# Patient Record
Sex: Male | Born: 1975 | Race: White | Hispanic: No | Marital: Married | Smoking: Former smoker
Health system: Southern US, Community
[De-identification: ages and names within clinical notes are randomized; demographics above are authoritative.]

## PROBLEM LIST (undated history)

## (undated) DIAGNOSIS — H9193 Unspecified hearing loss, bilateral: Secondary | ICD-10-CM

## (undated) DIAGNOSIS — C8190 Hodgkin lymphoma, unspecified, unspecified site: Secondary | ICD-10-CM

## (undated) DIAGNOSIS — R221 Localized swelling, mass and lump, neck: Secondary | ICD-10-CM

## (undated) DIAGNOSIS — I2699 Other pulmonary embolism without acute cor pulmonale: Secondary | ICD-10-CM

## (undated) DIAGNOSIS — R0602 Shortness of breath: Secondary | ICD-10-CM

## (undated) DIAGNOSIS — J9601 Acute respiratory failure with hypoxia: Secondary | ICD-10-CM

## (undated) DIAGNOSIS — K219 Gastro-esophageal reflux disease without esophagitis: Secondary | ICD-10-CM

## (undated) DIAGNOSIS — I5189 Other ill-defined heart diseases: Secondary | ICD-10-CM

## (undated) HISTORY — PX: BONE MARROW ASPIRATION: SHX1252

## (undated) HISTORY — PX: EYE SURGERY: SHX253

## (undated) HISTORY — PX: BONE MARROW BIOPSY: SHX199

---

## 2010-07-31 ENCOUNTER — Ambulatory Visit (HOSPITAL_COMMUNITY)
Admission: RE | Admit: 2010-07-31 | Discharge: 2010-07-31 | Payer: Self-pay | Source: Home / Self Care | Admitting: Family Medicine

## 2010-08-09 ENCOUNTER — Ambulatory Visit (HOSPITAL_COMMUNITY)
Admission: RE | Admit: 2010-08-09 | Discharge: 2010-08-09 | Payer: Self-pay | Source: Home / Self Care | Attending: Family Medicine | Admitting: Family Medicine

## 2012-04-24 ENCOUNTER — Other Ambulatory Visit: Payer: Self-pay | Admitting: Family Medicine

## 2012-04-24 DIAGNOSIS — R221 Localized swelling, mass and lump, neck: Secondary | ICD-10-CM

## 2012-04-26 DIAGNOSIS — R221 Localized swelling, mass and lump, neck: Secondary | ICD-10-CM

## 2012-04-26 HISTORY — DX: Localized swelling, mass and lump, neck: R22.1

## 2012-04-28 ENCOUNTER — Ambulatory Visit (HOSPITAL_COMMUNITY)
Admission: RE | Admit: 2012-04-28 | Discharge: 2012-04-28 | Disposition: A | Discharge status: Home / Self Care | Payer: BC Managed Care – PPO | Source: Ambulatory Visit | Attending: Family Medicine | Admitting: Family Medicine

## 2012-04-28 ENCOUNTER — Other Ambulatory Visit: Payer: Self-pay | Admitting: Family Medicine

## 2012-04-28 DIAGNOSIS — R221 Localized swelling, mass and lump, neck: Secondary | ICD-10-CM

## 2012-04-28 DIAGNOSIS — R220 Localized swelling, mass and lump, head: Secondary | ICD-10-CM | POA: Insufficient documentation

## 2012-04-28 DIAGNOSIS — R599 Enlarged lymph nodes, unspecified: Secondary | ICD-10-CM | POA: Insufficient documentation

## 2012-04-28 MED ORDER — IOHEXOL 300 MG/ML  SOLN
75.0000 mL | Freq: Once | INTRAMUSCULAR | Status: AC | PRN
  Administered 2012-04-28: 75 mL via INTRAVENOUS

## 2012-04-30 ENCOUNTER — Ambulatory Visit (INDEPENDENT_AMBULATORY_CARE_PROVIDER_SITE_OTHER): Payer: BC Managed Care – PPO | Admitting: Otolaryngology

## 2012-04-30 DIAGNOSIS — R599 Enlarged lymph nodes, unspecified: Secondary | ICD-10-CM

## 2012-04-30 DIAGNOSIS — D487 Neoplasm of uncertain behavior of other specified sites: Secondary | ICD-10-CM

## 2012-05-01 ENCOUNTER — Encounter (HOSPITAL_BASED_OUTPATIENT_CLINIC_OR_DEPARTMENT_OTHER): Payer: Self-pay | Admitting: *Deleted

## 2012-05-05 ENCOUNTER — Encounter (HOSPITAL_BASED_OUTPATIENT_CLINIC_OR_DEPARTMENT_OTHER): Payer: Self-pay | Admitting: *Deleted

## 2012-05-05 ENCOUNTER — Ambulatory Visit (HOSPITAL_BASED_OUTPATIENT_CLINIC_OR_DEPARTMENT_OTHER): Payer: BC Managed Care – PPO | Admitting: *Deleted

## 2012-05-05 ENCOUNTER — Ambulatory Visit (HOSPITAL_BASED_OUTPATIENT_CLINIC_OR_DEPARTMENT_OTHER)
Admission: RE | Admit: 2012-05-05 | Discharge: 2012-05-05 | Disposition: A | Discharge status: Home / Self Care | Payer: BC Managed Care – PPO | Source: Ambulatory Visit | Attending: Otolaryngology | Admitting: Otolaryngology

## 2012-05-05 ENCOUNTER — Encounter (HOSPITAL_BASED_OUTPATIENT_CLINIC_OR_DEPARTMENT_OTHER)
Admission: RE | Disposition: A | Discharge status: Home / Self Care | Payer: Self-pay | Source: Ambulatory Visit | Attending: Otolaryngology

## 2012-05-05 DIAGNOSIS — R221 Localized swelling, mass and lump, neck: Secondary | ICD-10-CM

## 2012-05-05 DIAGNOSIS — C8198 Hodgkin lymphoma, unspecified, lymph nodes of multiple sites: Secondary | ICD-10-CM | POA: Insufficient documentation

## 2012-05-05 HISTORY — DX: Localized swelling, mass and lump, neck: R22.1

## 2012-05-05 HISTORY — PX: MASS BIOPSY: SHX5445

## 2012-05-05 LAB — POCT HEMOGLOBIN-HEMACUE: Hemoglobin: 12 g/dL — ABNORMAL LOW (ref 13.0–17.0)

## 2012-05-05 SURGERY — BIOPSY, MASS, NECK
Anesthesia: General | Site: Neck | Laterality: Left | Wound class: Clean

## 2012-05-05 MED ORDER — MIDAZOLAM HCL 5 MG/5ML IJ SOLN
INTRAMUSCULAR | Status: DC | PRN
  Administered 2012-05-05: 2 mg via INTRAVENOUS

## 2012-05-05 MED ORDER — FENTANYL CITRATE 0.05 MG/ML IJ SOLN: 25.0000 ug | INTRAMUSCULAR | Status: DC | PRN

## 2012-05-05 MED ORDER — OXYCODONE HCL 5 MG PO TABS: 5.0000 mg | ORAL_TABLET | Freq: Once | ORAL | Status: DC | PRN

## 2012-05-05 MED ORDER — AMOXICILLIN 875 MG PO TABS: 875.0000 mg | ORAL_TABLET | Freq: Two times a day (BID) | ORAL | Status: AC

## 2012-05-05 MED ORDER — ACETAMINOPHEN 10 MG/ML IV SOLN
1000.0000 mg | Freq: Once | INTRAVENOUS | Status: AC
  Administered 2012-05-05: 1000 mg via INTRAVENOUS

## 2012-05-05 MED ORDER — METOCLOPRAMIDE HCL 5 MG/ML IJ SOLN: 10.0000 mg | Freq: Once | INTRAMUSCULAR | Status: DC | PRN

## 2012-05-05 MED ORDER — SUCCINYLCHOLINE CHLORIDE 20 MG/ML IJ SOLN
INTRAMUSCULAR | Status: DC | PRN
  Administered 2012-05-05: 100 mg via INTRAVENOUS

## 2012-05-05 MED ORDER — SCOPOLAMINE 1 MG/3DAYS TD PT72
1.0000 | MEDICATED_PATCH | Freq: Once | TRANSDERMAL | Status: DC
  Administered 2012-05-05: 1.5 mg via TRANSDERMAL

## 2012-05-05 MED ORDER — PROPOFOL 10 MG/ML IV BOLUS
INTRAVENOUS | Status: DC | PRN
  Administered 2012-05-05: 250 mg via INTRAVENOUS

## 2012-05-05 MED ORDER — LIDOCAINE-EPINEPHRINE 1 %-1:100000 IJ SOLN
INTRAMUSCULAR | Status: DC | PRN
  Administered 2012-05-05: 3 mL

## 2012-05-05 MED ORDER — CEFAZOLIN SODIUM-DEXTROSE 2-3 GM-% IV SOLR
2.0000 g | Freq: Once | INTRAVENOUS | Status: AC
  Administered 2012-05-05: 2 g via INTRAVENOUS

## 2012-05-05 MED ORDER — ONDANSETRON HCL 4 MG/2ML IJ SOLN
INTRAMUSCULAR | Status: DC | PRN
  Administered 2012-05-05: 4 mg via INTRAVENOUS

## 2012-05-05 MED ORDER — DEXAMETHASONE SODIUM PHOSPHATE 4 MG/ML IJ SOLN
INTRAMUSCULAR | Status: DC | PRN
  Administered 2012-05-05: 10 mg via INTRAVENOUS

## 2012-05-05 MED ORDER — HEMOSTATIC AGENTS (NO CHARGE) OPTIME
TOPICAL | Status: DC | PRN
  Administered 2012-05-05: 1 via TOPICAL

## 2012-05-05 MED ORDER — HYDROCODONE-ACETAMINOPHEN 5-500 MG PO TABS: 1.0000 | ORAL_TABLET | ORAL | Status: AC | PRN

## 2012-05-05 MED ORDER — FENTANYL CITRATE 0.05 MG/ML IJ SOLN
INTRAMUSCULAR | Status: DC | PRN
  Administered 2012-05-05: 75 ug via INTRAVENOUS

## 2012-05-05 MED ORDER — OXYCODONE HCL 5 MG/5ML PO SOLN: 5.0000 mg | Freq: Once | ORAL | Status: DC | PRN

## 2012-05-05 MED ORDER — 0.9 % SODIUM CHLORIDE (POUR BTL) OPTIME
TOPICAL | Status: DC | PRN
  Administered 2012-05-05: 200 mL

## 2012-05-05 MED ORDER — LACTATED RINGERS IV SOLN
INTRAVENOUS | Status: DC
  Administered 2012-05-05: 08:00:00 via INTRAVENOUS

## 2012-05-05 MED ORDER — LIDOCAINE HCL (CARDIAC) 20 MG/ML IV SOLN
INTRAVENOUS | Status: DC | PRN
  Administered 2012-05-05: 60 mg via INTRAVENOUS

## 2012-05-05 SURGICAL SUPPLY — 70 items
BENZOIN TINCTURE PRP APPL 2/3 (GAUZE/BANDAGES/DRESSINGS) IMPLANT
BLADE SURG 15 STRL LF DISP TIS (BLADE) ×1 IMPLANT
BLADE SURG 15 STRL SS (BLADE) ×1
CANISTER SUCTION 1200CC (MISCELLANEOUS) ×2 IMPLANT
CLEANER CAUTERY TIP 5X5 PAD (MISCELLANEOUS) IMPLANT
CLIP TI MEDIUM 6 (CLIP) IMPLANT
CLIP TI WIDE RED SMALL 6 (CLIP) IMPLANT
CLOTH BEACON ORANGE TIMEOUT ST (SAFETY) ×2 IMPLANT
CORDS BIPOLAR (ELECTRODE) ×2 IMPLANT
COVER MAYO STAND STRL (DRAPES) ×2 IMPLANT
COVER TABLE BACK 60X90 (DRAPES) ×2 IMPLANT
DECANTER SPIKE VIAL GLASS SM (MISCELLANEOUS) ×2 IMPLANT
DERMABOND ADVANCED (GAUZE/BANDAGES/DRESSINGS) ×1
DERMABOND ADVANCED .7 DNX12 (GAUZE/BANDAGES/DRESSINGS) ×1 IMPLANT
DRAIN JACKSON RD 7FR 3/32 (WOUND CARE) IMPLANT
DRAIN PENROSE 1/4X12 LTX STRL (WOUND CARE) IMPLANT
DRAIN TLS ROUND 10FR (DRAIN) IMPLANT
DRAPE U-SHAPE 76X120 STRL (DRAPES) ×2 IMPLANT
ELECT COATED BLADE 2.86 ST (ELECTRODE) ×2 IMPLANT
ELECT NEEDLE BLADE 2-5/6 (NEEDLE) IMPLANT
ELECT PAIRED SUBDERMAL (MISCELLANEOUS)
ELECT REM PT RETURN 9FT ADLT (ELECTROSURGICAL) ×2
ELECTRODE PAIRED SUBDERMAL (MISCELLANEOUS) IMPLANT
ELECTRODE REM PT RTRN 9FT ADLT (ELECTROSURGICAL) ×1 IMPLANT
EVACUATOR SILICONE 100CC (DRAIN) IMPLANT
GAUZE SPONGE 4X4 12PLY STRL LF (GAUZE/BANDAGES/DRESSINGS) IMPLANT
GAUZE SPONGE 4X4 16PLY XRAY LF (GAUZE/BANDAGES/DRESSINGS) IMPLANT
GLOVE BIO SURGEON STRL SZ7.5 (GLOVE) ×2 IMPLANT
GLOVE ECLIPSE 6.5 STRL STRAW (GLOVE) ×2 IMPLANT
GOWN PREVENTION PLUS XLARGE (GOWN DISPOSABLE) ×4 IMPLANT
HEMOSTAT SURGICEL .5X2 ABSORB (HEMOSTASIS) ×2 IMPLANT
LOCATOR NERVE 3 VOLT (DISPOSABLE) ×2 IMPLANT
NEEDLE 27GAX1X1/2 (NEEDLE) ×2 IMPLANT
NEEDLE HYPO 25X1 1.5 SAFETY (NEEDLE) ×2 IMPLANT
NS IRRIG 1000ML POUR BTL (IV SOLUTION) ×2 IMPLANT
PACK BASIN DAY SURGERY FS (CUSTOM PROCEDURE TRAY) ×2 IMPLANT
PAD CLEANER CAUTERY TIP 5X5 (MISCELLANEOUS)
PENCIL BUTTON HOLSTER BLD 10FT (ELECTRODE) ×2 IMPLANT
PIN SAFETY STERILE (MISCELLANEOUS) IMPLANT
PROBE NERVBE PRASS .33 (MISCELLANEOUS) IMPLANT
SLEEVE SCD COMPRESS KNEE MED (MISCELLANEOUS) ×2 IMPLANT
SPONGE GAUZE 2X2 8PLY STRL LF (GAUZE/BANDAGES/DRESSINGS) IMPLANT
STAPLER VISISTAT 35W (STAPLE) IMPLANT
STRIP CLOSURE SKIN 1/4X4 (GAUZE/BANDAGES/DRESSINGS) IMPLANT
SUCTION FRAZIER TIP 10 FR DISP (SUCTIONS) IMPLANT
SUT CHROMIC 3 0 PS 2 (SUTURE) IMPLANT
SUT CHROMIC 4 0 P 3 18 (SUTURE) IMPLANT
SUT ETHILON 3 0 PS 1 (SUTURE) IMPLANT
SUT ETHILON 4 0 PS 2 18 (SUTURE) IMPLANT
SUT ETHILON 5 0 P 3 18 (SUTURE)
SUT NYLON ETHILON 5-0 P-3 1X18 (SUTURE) IMPLANT
SUT PLAIN 5 0 P 3 18 (SUTURE) IMPLANT
SUT PROLENE 4 0 P 3 18 (SUTURE) IMPLANT
SUT PROLENE 5 0 P 3 (SUTURE) IMPLANT
SUT SILK 3 0 TIES 17X18 (SUTURE)
SUT SILK 3-0 18XBRD TIE BLK (SUTURE) IMPLANT
SUT SILK 4 0 TIES 17X18 (SUTURE) ×2 IMPLANT
SUT SURG 6 0 PRE1/P 10 (SUTURE) IMPLANT
SUT VIC AB 3-0 FS2 27 (SUTURE) IMPLANT
SUT VIC AB 4-0 P-3 18XBRD (SUTURE) ×1 IMPLANT
SUT VIC AB 4-0 P3 18 (SUTURE) ×1
SUT VIC AB 4-0 RB1 27 (SUTURE)
SUT VIC AB 4-0 RB1 27X BRD (SUTURE) IMPLANT
SUT VICRYL 4-0 PS2 18IN ABS (SUTURE) IMPLANT
SYR BULB 3OZ (MISCELLANEOUS) ×2 IMPLANT
SYR CONTROL 10ML LL (SYRINGE) ×2 IMPLANT
TOWEL OR 17X24 6PK STRL BLUE (TOWEL DISPOSABLE) ×2 IMPLANT
TRAY DSU PREP LF (CUSTOM PROCEDURE TRAY) ×2 IMPLANT
TUBE CONNECTING 20X1/4 (TUBING) ×2 IMPLANT
WATER STERILE IRR 1000ML POUR (IV SOLUTION) IMPLANT

## 2012-05-05 NOTE — Brief Op Note (Signed)
05/05/2012  9:49 AM  PATIENT:  Samuel [name]  36 y.o. male  PRE-OPERATIVE DIAGNOSIS:  left neck mass  POST-OPERATIVE DIAGNOSIS:  left neck mass  PROCEDURE:  Procedure(s) (LRB) with comments: NECK MASS BIOPSY (Left) - excisional biopsy of left neck mass   SURGEON:  Surgeon(s) and Role:    * Sui W Onur Mori, MD - Primary  PHYSICIAN ASSISTANT:   ASSISTANTS: none   ANESTHESIA:   general  EBL:  Total I/O In: 1000 [I.V.:1000] Out: -   BLOOD ADMINISTERED:none  DRAINS: none   LOCAL MEDICATIONS USED:  LIDOCAINE  and Amount: 3 ml  SPECIMEN:  Source of Specimen:  Excisional biopsy of 2 left neck lymph nodes  DISPOSITION OF SPECIMEN:  PATHOLOGY  COUNTS:  YES  TOURNIQUET:  * No tourniquets in log *  DICTATION: Reubin Milan Dictation and Other Dictation: Dictation Number [id number]  PLAN OF CARE: Discharge to home after PACU  PATIENT DISPOSITION:  PACU - hemodynamically stable.   Delay start of Pharmacological VTE agent (>24hrs) due to surgical blood loss or risk of bleeding: not applicable

## 2012-05-05 NOTE — H&P (Signed)
  H&P Update  Pt's original H&P dated 04/30/12 reviewed and placed in chart (to be scanned).  I personally examined the patient today.  No change in health. Proceed with excisional biopsy of left neck mass.

## 2012-05-05 NOTE — Transfer of Care (Signed)
Immediate Anesthesia Transfer of Care Note  Patient: Samuel [name]  Procedure(s) Performed: Procedure(s) (LRB) with comments: NECK MASS BIOPSY (Left) - Excisional biopsy of left neck mass   Patient Location: PACU  Anesthesia Type: General  Level of Consciousness: awake and alert   Airway & Oxygen Therapy: Patient Spontanous Breathing and Patient connected to face mask oxygen  Post-op Assessment: Report given to PACU RN, Post -op Vital signs reviewed and stable and Patient moving all extremities  Post vital signs: Reviewed and stable  Complications: No apparent anesthesia complications

## 2012-05-05 NOTE — Anesthesia Postprocedure Evaluation (Signed)
Anesthesia Post Note  Patient: Samuel [name]  Procedure(s) Performed: Procedure(s) (LRB): NECK MASS BIOPSY (Left)  Anesthesia type: General  Patient location: PACU  Post pain: Pain level controlled  Post assessment: Patient's Cardiovascular Status Stable  Last Vitals:  Filed Vitals:   05/05/12 1048  BP: 118/58  Pulse: 56  Temp: 36.6 C  Resp: 16    Post vital signs: Reviewed and stable  Level of consciousness: alert  Complications: No apparent anesthesia complications

## 2012-05-05 NOTE — Anesthesia Preprocedure Evaluation (Addendum)
Anesthesia Evaluation  Patient identified by MRN, date of birth, ID band Patient awake    Reviewed: Allergy & Precautions, H&P , NPO status , Patient's Chart, lab work & pertinent test results, reviewed documented beta blocker date and time   Airway Mallampati: II TM Distance: >3 FB Neck ROM: full    Dental   Pulmonary neg pulmonary ROS,  breath sounds clear to auscultation        Cardiovascular negative cardio ROS  Rhythm:regular     Neuro/Psych negative neurological ROS  negative psych ROS   GI/Hepatic negative GI ROS, Neg liver ROS,   Endo/Other  negative endocrine ROS  Renal/GU negative Renal ROS  negative genitourinary   Musculoskeletal   Abdominal   Peds  Hematology negative hematology ROS (+)   Anesthesia Other Findings See surgeon's H&P   Reproductive/Obstetrics negative OB ROS                           Anesthesia Physical Anesthesia Plan  ASA: I  Anesthesia Plan: General   Post-op Pain Management:    Induction: Intravenous  Airway Management Planned: Oral ETT  Additional Equipment:   Intra-op Plan:   Post-operative Plan: Extubation in OR  Informed Consent: I have reviewed the patients History and Physical, chart, labs and discussed the procedure including the risks, benefits and alternatives for the proposed anesthesia with the patient or authorized representative who has indicated his/her understanding and acceptance.   Dental Advisory Given  Plan Discussed with: CRNA and Surgeon  Anesthesia Plan Comments:         Anesthesia Quick Evaluation  

## 2012-05-05 NOTE — Anesthesia Procedure Notes (Signed)
Procedure Name: Intubation Date/Time: 05/05/2012 9:04 AM Performed by: Meyer Russel Pre-anesthesia Checklist: Patient identified, Emergency Drugs available, Suction available and Patient being monitored Patient Re-evaluated:Patient Re-evaluated prior to inductionOxygen Delivery Method: Circle System Utilized Preoxygenation: Pre-oxygenation with 100% oxygen Intubation Type: IV induction Ventilation: Mask ventilation without difficulty Laryngoscope Size: Miller and 2 Grade View: Grade I Tube type: Oral Number of attempts: 1 Airway Equipment and Method: stylet and LTA kit utilized Placement Confirmation: ETT inserted through vocal cords under direct vision,  positive ETCO2 and breath sounds checked- equal and bilateral Secured at: 22 cm Tube secured with: Tape Dental Injury: Teeth and Oropharynx as per pre-operative assessment

## 2012-05-06 ENCOUNTER — Encounter (HOSPITAL_BASED_OUTPATIENT_CLINIC_OR_DEPARTMENT_OTHER): Payer: Self-pay | Admitting: Otolaryngology

## 2012-05-06 NOTE — Op Note (Signed)
NAME:  Samuel [name], MALER                 ACCOUNT NO.:  [id number]  MEDICAL RECORD NO.:  [id number]  LOCATION:                                 FACILITY:  PHYSICIAN:  Newman Pies, MD            DATE OF BIRTH:  [birth year]  DATE OF PROCEDURE:  05/05/2012 DATE OF DISCHARGE:                              OPERATIVE REPORT   SURGEON:  Newman Pies, MD  PREOPERATIVE DIAGNOSIS:  Bilateral cervical and mediastinal lymphadenopathy.  POSTOPERATIVE DIAGNOSIS:  Bilateral cervical and mediastinal lymphadenopathy.  PROCEDURE PERFORMED:  Excisional biopsy of left neck mass.  ANESTHESIA:  General endotracheal tube anesthesia.  COMPLICATIONS:  None.  ESTIMATED BLOOD LOSS:  Minimal.  INDICATION FOR PROCEDURE:  The patient is a 36 year old male who recently noted a firm left inferior neck mass.  He was seen by his primary care physician, who ordered a neck CT scan.  The CT shows bilateral extensive lymphadenopathy, as well as large masses within the mediastinum.  The results were concerning for lymphoma.  On examination, the patient was noted to have multiple enlarged lymph nodes within the inferior neck bilaterally.  The most prominent and probable one was located at the left supraclavicular area, posterior to the sternocleidomastoid muscles.  Based on the above findings, the decision was made for the patient to undergo excisional biopsy of his deep left neck mass.  The risks, benefits, alternatives, and details of the procedure were discussed with the patient.  Questions were invited and answered.  Informed consent was obtained.  DESCRIPTION:  The patient was taken to the operating room and placed supine on the operating table.  General endotracheal tube anesthesia was administered by the anesthesiologist.  Preop IV antibiotic was given. The patient was positioned and prepped and draped in a standard fashion for left neck surgery.  Lidocaine 1% with 1:100,000 epinephrine was injected at the planned site  of incisions.  Horizontal incision was made over the left supraclavicular area.  The incision was carried down past the level of the platysma muscles.  Superiorly-based subplatysmal flap was elevated in a standard fashion.  The border of the sternocleidomastoid muscles was identified and retracted anteriorly. The spinal accessory nerve was identified and preserved.  At this point, the patient was noted to have multiple matted lymph nodes within the supraclavicular area.  Two enlarged lymph nodes were carefully dissected freed from the surrounding soft tissue.  The specimens were sent to the Pathology Department for histologic identification and flow cytometry analysis.  The spinal accessory nerve was preserved throughout the procedure.  The surgical site was copiously irrigated.  A small piece of Surgicel was placed for hemostasis.  The incision was closed in layers with 4-0 Vicryl and Dermabond.  The care of the patient was turned over to the anesthesiologist.  The patient was awakened from anesthesia without difficulty.  He was extubated and transferred to the recovery room in good condition.  OPERATIVE FINDINGS:  Multiple enlarged lymph nodes were noted within the left supraclavicular area.  Two enlarged lymph nodes were removed and sent to the Pathology Department for analysis.  SPECIMEN:  Left supraclavicular lymph nodes.  FOLLOWUP  CARE:  The patient will be discharged home once he is awake and alert.  He will be placed on Vicodin p.r.n. pain, and amoxicillin 875 mg p.o. b.i.d. for 5 days.  The patient will follow up in my office in 1 week.     Newman Pies, MD     ST/MEDQ  D:  05/05/2012  T:  05/06/2012  Job:  [id number]  cc:   Lorin Picket A. Gerda Diss, MD

## 2012-05-15 ENCOUNTER — Encounter (HOSPITAL_COMMUNITY): Payer: Self-pay | Admitting: Oncology

## 2012-05-15 ENCOUNTER — Encounter (HOSPITAL_COMMUNITY): Payer: BC Managed Care – PPO | Attending: Oncology | Admitting: Oncology

## 2012-05-15 VITALS — BP 101/59 | HR 93 | Temp 98.3°F | Resp 16 | Ht 68.5 in | Wt 189.4 lb

## 2012-05-15 DIAGNOSIS — R21 Rash and other nonspecific skin eruption: Secondary | ICD-10-CM

## 2012-05-15 DIAGNOSIS — C8190 Hodgkin lymphoma, unspecified, unspecified site: Secondary | ICD-10-CM | POA: Insufficient documentation

## 2012-05-15 DIAGNOSIS — C8119 Nodular sclerosis classical Hodgkin lymphoma, extranodal and solid organ sites: Secondary | ICD-10-CM

## 2012-05-15 LAB — CBC WITH DIFFERENTIAL/PLATELET
Hemoglobin: 12.1 g/dL — ABNORMAL LOW (ref 13.0–17.0)
Lymphocytes Relative: 9 % — ABNORMAL LOW (ref 12–46)
Lymphs Abs: 1.2 10*3/uL (ref 0.7–4.0)
Monocytes Relative: 7 % (ref 3–12)
Neutro Abs: 10.8 10*3/uL — ABNORMAL HIGH (ref 1.7–7.7)
Neutrophils Relative %: 83 % — ABNORMAL HIGH (ref 43–77)
Platelets: 260 10*3/uL (ref 150–400)
RBC: 4.51 MIL/uL (ref 4.22–5.81)
WBC: 13 10*3/uL — ABNORMAL HIGH (ref 4.0–10.5)

## 2012-05-15 LAB — SEDIMENTATION RATE: Sed Rate: 60 mm/hr — ABNORMAL HIGH (ref 0–16)

## 2012-05-15 LAB — COMPREHENSIVE METABOLIC PANEL
ALT: 29 U/L (ref 0–53)
Albumin: 3.4 g/dL — ABNORMAL LOW (ref 3.5–5.2)
Alkaline Phosphatase: 111 U/L (ref 39–117)
BUN: 13 mg/dL (ref 6–23)
Chloride: 97 mEq/L (ref 96–112)
Potassium: 4 mEq/L (ref 3.5–5.1)
Sodium: 133 mEq/L — ABNORMAL LOW (ref 135–145)
Total Bilirubin: 0.5 mg/dL (ref 0.3–1.2)
Total Protein: 7.8 g/dL (ref 6.0–8.3)

## 2012-05-15 LAB — LACTATE DEHYDROGENASE: LDH: 213 U/L (ref 94–250)

## 2012-05-15 NOTE — Progress Notes (Signed)
Samuel [name] presented for labwork. Labs per MD order drawn via Peripheral Line 23 gauge needle inserted in right AC Good blood return present. Procedure without incident.  Needle removed intact. Patient tolerated procedure well.

## 2012-05-15 NOTE — Progress Notes (Signed)
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 Problem #2 history smoking a half-pack of cigarettes a day for approximately 10 years so he has quit recently. This is a very pleasant 36 year old gentleman who for 5 weeks ago noticed a lump in his left neck that was painless but did not go away. He states he's had lumps under his arm hips which have come and gone over the years but never like this. He is not having fevers, night sweats, nor has he lost 10% of his body weight within the last 6 months. He thinks his weight is down approximately 10 pounds in the last 6 months maximum. He is accompanied by his wife. They have been married a number of years. They have 2 children both daughters alive in good health. He has one brother and 2 sisters in good health to the best of his knowledge and his parents are in good health. He works at the [employer]. He is working for 2 years. Prior to that he worked at the [employer]. He is not aware of lymphadenopathy under his arms or in his groin area. Is not having bone pain back pain abdominal pain. Bowels are fine. He does not complain of shortness of breath or cough. He does not have headaches. He does not have nausea or vomiting. He does not use alcohol or drugs. Vital signs are recorded. He is afebrile. Pulse is 80 and regular in the supine position. Respirations are 16 and unlabored. He has clear cut lymphadenopathy in both supraclavicular fossa and in the left low anterior chain. The largest lymph node in the right supraclavicular fossa is 3 x 3 cm the largest on the left is approximately the same. He has no axillary lymphadenopathy. He has no epitrochlear adenopathy. He has no inguinal lymphadenopathy. He is no hepatosplenomegaly. Bowel sounds are normal. His lungs are clear to auscultation and percussion. His heart shows a regular rhythm and rate without murmur rub or gallop. He has no leg edema  no arm edema. Teeth are in good repair is clear tongue is normal in the midline. His skin however shows a rash which is macular papular and throughout the trunk upper arms back abdomen consistent with a drug rash. He just finished amoxicillin on Monday. This is somewhat pruritic but only mildly so.  This gentleman needs a complete evaluation and staging for his Hodgkin's disease nodular sclerosing type. He will need a PET scan, bone marrow aspirate and biopsy, 2-D echo, bone function studies, and radiation therapy consultation at some point in the future low blood work which we will do today. He will need chemotherapy and therefore will need a Port-A-Cath placed

## 2012-05-15 NOTE — Patient Instructions (Addendum)
[facility]  Discharge Instructions  RECOMMENDATIONS MADE BY THE CONSULTANT AND ANY TEST RESULTS WILL BE SENT TO YOUR REFERRING DOCTOR.   EXAM FINDINGS BY MD TODAY AND SIGNS AND SYMPTOMS TO REPORT TO CLINIC OR PRIMARY MD: Exam and discussion by MD.  Bonita Quin will need to have a bone marrow biopsy and aspiration on Monday morning, a PET scan, a Echocardiogram, pulmonary function tests, port a cath placement and a radiation therapy consult.  As these are scheduled we will let you know.  You will need chemotherapy and we will get you scheduled for teaching.  You have Hodgkin's Disease - Nodular Sclerosing Type. MEDICATIONS PRESCRIBED: xanax 1 mg   - take 1 tablet 1 hour prior to your bone marrow biopsy on Monday morning.  Also, take 1 pain pill at the same time.   INSTRUCTIONS GIVEN AND DISCUSSED: Other :  For PET scan do not eat or drink anything after midnight - especially nothing with sugar (gum, mints, hard candy, etc.)  SPECIAL INSTRUCTIONS/FOLLOW-UP: Xray Studies Needed as scheduled and Return to Clinic on Monday at 8:10am.   I acknowledge that I have been informed and understand all the instructions given to me and received a copy. I do not have any more questions at this time, but understand that I may call the Specialty Clinic at [facility] at [phone] during business hours should I have any further questions or need assistance in obtaining follow-up care.    __________________________________________  _____________  __________ Signature of Patient or Authorized Representative            Date                   Time    __________________________________________ Nurse's Signature

## 2012-05-18 ENCOUNTER — Encounter (HOSPITAL_COMMUNITY): Payer: BC Managed Care – PPO

## 2012-05-18 ENCOUNTER — Encounter (HOSPITAL_BASED_OUTPATIENT_CLINIC_OR_DEPARTMENT_OTHER): Payer: BC Managed Care – PPO | Admitting: Oncology

## 2012-05-18 VITALS — BP 121/65 | HR 82 | Temp 98.3°F | Resp 16

## 2012-05-18 DIAGNOSIS — C8190 Hodgkin lymphoma, unspecified, unspecified site: Secondary | ICD-10-CM

## 2012-05-18 DIAGNOSIS — C8119 Nodular sclerosis classical Hodgkin lymphoma, extranodal and solid organ sites: Secondary | ICD-10-CM

## 2012-05-18 LAB — IGG, IGA, IGM: IgM, Serum: 86 mg/dL (ref 41–251)

## 2012-05-18 LAB — BETA 2 MICROGLOBULIN, SERUM: Beta-2 Microglobulin: 1.69 mg/L (ref 1.01–1.73)

## 2012-05-18 LAB — CBC WITH DIFFERENTIAL/PLATELET
Basophils Relative: 0 % (ref 0–1)
HCT: 38 % — ABNORMAL LOW (ref 39.0–52.0)
Hemoglobin: 12.5 g/dL — ABNORMAL LOW (ref 13.0–17.0)
Lymphocytes Relative: 9 % — ABNORMAL LOW (ref 12–46)
Lymphs Abs: 1.3 10*3/uL (ref 0.7–4.0)
Monocytes Absolute: 1 10*3/uL (ref 0.1–1.0)
Monocytes Relative: 7 % (ref 3–12)
Neutro Abs: 11.3 10*3/uL — ABNORMAL HIGH (ref 1.7–7.7)
Neutrophils Relative %: 82 % — ABNORMAL HIGH (ref 43–77)
RBC: 4.63 MIL/uL (ref 4.22–5.81)
WBC: 13.8 10*3/uL — ABNORMAL HIGH (ref 4.0–10.5)

## 2012-05-18 NOTE — Progress Notes (Signed)
Procedure note: After informed consent Samuel [name] was placed in the prone position and the posterior superior iliac spinous processes were identified. The left was chosen. He had hair removed over the area and then he was cleansed with 3 Betadine swabs. He was then anesthetized with 9 cc of 2% plain Xylocaine. A bone marrow aspirate and biopsy was obtained without difficulty. It was sent for routine pathological evaluation as well as flow cytometry. This was done for staging of his Hodgkin's disease. No complications occurred.

## 2012-05-18 NOTE — Patient Instructions (Addendum)
[facility]  Discharge Instructions  RECOMMENDATIONS MADE BY THE CONSULTANT AND ANY TEST RESULTS WILL BE SENT TO YOUR REFERRING DOCTOR.   EXAM FINDINGS BY MD TODAY AND SIGNS AND SYMPTOMS TO REPORT TO CLINIC OR PRIMARY MD: You had a bone marrow biopsy and aspiration performed today.  Keep the dressing clean and dry for the next 24 - 36 hours.  Take your pain medication as needed.    INSTRUCTIONS GIVEN AND DISCUSSED: Other :  If bleeding at the site occurs apply direct pressure for 10 minutes.  If bleeding does not stop go to the ED.  SPECIAL INSTRUCTIONS/FOLLOW-UP: Other (Referral/Appointments) :  As scheduled.   I acknowledge that I have been informed and understand all the instructions given to me and received a copy. I do not have any more questions at this time, but understand that I may call the Specialty Clinic at [facility] at [phone] during business hours should I have any further questions or need assistance in obtaining follow-up care.    __________________________________________  _____________  __________ Signature of Patient or Authorized Representative            Date                   Time    __________________________________________ Nurse's Signature

## 2012-05-18 NOTE — Progress Notes (Signed)
Samuel [name] presented for labwork. Labs per MD order drawn via Peripheral Line 23 gauge needle inserted in right AC  Good blood return present. Procedure without incident.  Needle removed intact. Patient tolerated procedure well. [facility] BONE MARROW BIOPSY/ASPIRATE PROGRESS NOTE  Samuel [name] presents for Bone Marrow biopsy per MD orders. Drue Stager Pall verbalized understanding of procedure. Xanax 1 mg po and Hydrocodone 5/500 po taken by patient at 7:30am at home prior to arrival. Consent reviewed and signed.  Drue Stager Mcweeney positioned supine for procedure. Time-out performed and Bone Marrow Checklist. Procedure began at 0855. Xylocaine 2% 10 cc used for local and administered to patient by Dr. Mariel Sleet. Procedure completed at 0910. Patient tolerated well. Pressure dressing applied to the left hip with instructions to leave in place for 24 hours. Patient instructed to report any bleeding that saturates dressing and to take pain medication Hydrocodone as directed. Dressing dry and intact to the left hip on discharge.

## 2012-05-19 ENCOUNTER — Other Ambulatory Visit (HOSPITAL_COMMUNITY): Payer: BC Managed Care – PPO

## 2012-05-19 ENCOUNTER — Telehealth (HOSPITAL_COMMUNITY): Payer: Self-pay | Admitting: *Deleted

## 2012-05-19 ENCOUNTER — Telehealth (HOSPITAL_COMMUNITY): Payer: Self-pay | Admitting: Oncology

## 2012-05-19 NOTE — Telephone Encounter (Signed)
[phone] COV IS ACTIVE $800DED WHICH IS MET AND OOP OF $3000. $983.84 MET NO PRE X APPLIES ? IF CHEMO TX REQUIRE AUTH CPT CODES: J9000 ADRIAMYCIN*9360 VINBLASTINE*9040*BLEOMYCIN*9130 DTIC CSR- MARGIE STATES NO AUTH IS REQUIRED FOR CODES LISTED ABOVE [phone]

## 2012-05-21 NOTE — H&P (Signed)
  NTS SOAP Note  Vital Signs:  Vitals as of: 05/21/2012: Systolic 119: Diastolic 71: Heart Rate 67: Temp 98.5F: Height 11ft 9in: Weight 194Lbs 0 Ounces: BMI 29  BMI : 28.65 kg/m2  Subjective: This 36 Years 64 Months old Male presents for portacath insertion. Is about to undergo chemotherapy for Hodgkin's lymphoma and needs central venous access.  Review of Symptoms:  Constitutional:unremarkable   Head:unremarkable    Eyes:unremarkable   Nose/Mouth/Throat:unremarkable Cardiovascular:  unremarkable   Respiratory:unremarkable   Gastrointestinal:  unremarkable   Genitourinary:unremarkable     Musculoskeletal:unremarkable     Past Medical History:    Reviewed   Past Medical History  Surgical History: neck biopsy Medical Problems: Hodgkin's disease Allergies: amoxicillin Medications: none   Social History:Reviewed  Social History  Preferred Language: English ([place]) Race:  White Ethnicity: Not Hispanic / Latino Age: 36 Years 4 Months Marital Status:  M Alcohol:  No Recreational drug(s):  No   Smoking Status: Never smoker reviewed on 05/21/2012  Family History:  Reviewed   Family History  Is there a family history [id number]    Objective Information: General:  Well appearing, well nourished in no distress. Neck:  Supple.  Heart:  RRR, no murmur Lungs:    CTA bilaterally, no wheezes, rhonchi, rales.  Breathing unlabored.  Assessment:Hodgkin's lymphoma, need for central venous access  Diagnosis &amp; Procedure: DiagnosisCode: 201.94, ProcedureCode: 78295,    Plan:Scheduled for portacath insertion on 07/04/12.   Patient Education:Alternative treatments to surgery were discussed with patient (and family).  Risks and benefits  of procedure were fully explained to the patient (and family) who gave informed consent. Patient/family questions were addressed.  Follow-up:Pending Surgery

## 2012-05-26 NOTE — Patient Instructions (Addendum)
[facility] Samuel [name]  DOB 11/24/1975 CSN [id number]  MRN [id number] Dr. Glenford Peers   CHEMOTHERAPY INSTRUCTIONS  Bleomycin - hair loss, kidney toxicity, pulmonary fibrosis, fever, chills, hypersensitivity or anaphylactic reaction (rare). Patients with lymphoma have a higher incidence of anaphylaxis after receiving bleomycin than do other patients who receive the drug. Patients who have received bleomycin are at risk for pulmonary toxicity when exposed to oxygen during surgery. You must always -for the rest of your life- disclose to your surgeon/anesthesiologist that you have taken bleomycin to prevent a fatal episode of pulmonary failure. The first sign of pulmonary toxicity is a cough.   Vinblastine - bone marrow suppression (lowers white blood cells (fight infection), lowers red blood cells (make up your blood), lowers platelets (help blood to clot). Hair loss, loss of appetite, jaw pain, peripheral neuropathy (numbness/tingling/burning in hands/fingers/feet/toes, constipation.   Dacarbazine - severe neutropenia (low white blood cell count) and thrombocytopenia (low platelet count).  These 2 lab values (wbc, platelets) could bottom out 2-3 weeks after treatment. This can cause severe nausea and vomiting for up to 12 hours, no appetite, hair loss, rash, flu-like syndrome (may occur up to 7 days after drug administration) low blood pressure, sensitivity to light. This will infuse over 30-60 minutes.   Adriamycin - bone marrow suppression (low white blood cells/low platelets/low red blood cells), hair loss, nausea/vomiting, fatigue, mouth sores, cardiotoxicity (this is why you will have 2D echoes performed periodically during treatment), sensitivity to light - wear sunscreen and sunglasses. This will turn your urine RED for 24-48 hours. Your urine should clear or turn back to yellow within 48 hours. If your urine doesn't clear/turn yellow, please call the  Cancer Center [phone].   POTENTIAL SIDE EFFECTS OF TREATMENT: Increased Susceptibility to Infection, Vomiting, Constipation, Hair Thinning, Changes in Character of Skin and Nails (brittleness, dryness,etc.), Pigment Changes, Bone Marrow Suppression, Abdominal Cramping, Complete Hair Loss, Nausea, Diarrhea, Sun Sensitivity and Mouth Sores   SELF IMAGE NEEDS AND REFERRALS MADE: Obtain hair accessories as soon as possible (caps,etc.)  EDUCATIONAL MATERIALS GIVEN AND REVIEWED: Chemotherapy and You Specific Instruction Sheets on Bleomycin, Vinblastine, Dacarbazine, Adriamycin, dexamethasone, zofran, ativan, emla cream  SELF CARE ACTIVITIES WHILE ON CHEMOTHERAPY: Increase your fluid intake 48 hours prior to treatment and drink at least 2 quarts per day after treatment., No alcohol intake., No aspirin or other medications unless approved by your oncologist., Eat foods that are light and easy to digest., Eat foods at cold or room temperature., No fried, fatty, or spicy foods immediately before or after treatment., Have teeth cleaned professionally before starting treatment. Keep dentures and partial plates clean., Use soft toothbrush and do not use mouthwashes that contain alcohol. Biotene is a good mouthwash that is available at most pharmacies or may be ordered by calling [phone]., Use warm salt water gargles (1 teaspoon salt per 1 quart warm water) before and after meals and at bedtime. Or you may rinse with 2 tablespoons of three -percent hydrogen peroxide mixed in eight ounces of water., Always use sunscreen with SPF (Sun Protection Factor) of 30 or higher., Use your nausea medication as directed to prevent nausea., Use your stool softener or laxative as directed to prevent constipation. and Use your anti-diarrheal medication as directed to stop diarrhea.  Please wash your hands for at least 30 seconds using warm soapy water. Handwashing is the #1 way to prevent the spread of germs. Stay away  from sick people or people  who are getting over a cold. If you develop respiratory systems such as green/yellow mucus production or productive cough or persistent cough let us know and we will see if you need an antibiotic. It is a good idea to keep a pair of gloves on when going into grocery stores/[employer] to decrease your risk of coming into contact with germs on the carts, etc. Carry alcohol hand gel with you at all times and use it frequently if out in public. All foods need to be cooked thoroughly. No raw foods. No medium or undercooked meats, eggs. If your food is cooked medium well, it does not need to be hot pink or saturated with bloody liquid at all. Vegetables and fruits need to be washed/rinsed under the faucet with a dish detergent before being consumed. You can eat raw fruits and vegetables unless we tell you otherwise but it would be best if you cooked them or bought frozen. Do not eat off of salad bars or hot bars unless you really trust the cleanliness of the restaurant. If you need dental work, please let Dr. Mariel Sleet know before you go for your appointment so that we can coordinate the best possible time for you in regards to your chemo regimen. You need to also let your dentist know that you are actively taking chemo. We may need to do labs prior to your dental appointment. We also want your bowels moving at least every other day. If this is not happening, we need to know so that we can get you on a bowel regimen to help you go.     MEDICATIONS: You have been given prescriptions for the following medications:  Dexamethasone 4mg  tablet. Starting the day after chemo, take 2 tablets in the am and 2 tablets in the pm for 3 days. This steroid is being given to reduce your nausea/vomiting. This will also generally make you feel better (muscle aches, etc.). It will probably give you more energy and make you want to eat. It may also make you have trouble sleeping or make you irritable. *After  receiving this drug in your IV on the day of chemo, you may experience a red "glow" to your face, neck, shoulders, chest. This will go away after a couple of days. This sometimes happens after receiving the medication through your vein.   Zofran 8mg  tablet. Starting the day after chemo take 1 tablet in the am and 1 tablet in the pm for 3 days. Then may take 1 tablet two times a day IF needed for nausea/vomiting. Side Effect: constipation.   Ativan 1mg  tablet. May take 1 tablet every 4 hours IF needed for nausea/vomiting. This medication will make you sleepy. Do not drive or operate machinery/climb ladders, etc., while taking this medication.   Compazine 25mg  suppository. Insert 1 rectally every 6 hours IF needed for nausea/vomiting.  OVER-the-COUNTER meds:  Colace - this is a stool softener. Take 100mg  capsule 2-6 times a day as needed. If you have to take more than 6 capsules of Colace a day call the Cancer Center.  Senna - this is a mild laxative used to treat mild constipation. May take 2 tabs by mouth daily or up to twice a day as needed for mild constipation.  Milk of Magnesia - this is a laxative used to treat moderate to severe constipation. May take 2-4 tablespoons every 8 hours as needed. May increase to 8 tablespoons x 1 dose and if no bowel movement call the Cancer Center.  Imodium - this is for diarrhea. Take 2 tabs after 1st loose stool and then 1 tab after each loose stool until you go a total of 12 hours without a loose stool. Call Cancer Center if loose stools continue.   SYMPTOMS TO REPORT AS SOON AS POSSIBLE AFTER TREATMENT:  FEVER GREATER THAN 100.5 F  CHILLS WITH OR WITHOUT FEVER  NAUSEA AND VOMITING THAT IS NOT CONTROLLED WITH YOUR NAUSEA MEDICATION  UNUSUAL SHORTNESS OF BREATH  UNUSUAL BRUISING OR BLEEDING  TENDERNESS IN MOUTH AND THROAT WITH OR WITHOUT PRESENCE OF ULCERS  URINARY PROBLEMS  BOWEL PROBLEMS  UNUSUAL RASH    Wear comfortable clothing and  clothing appropriate for easy access to any Portacath or PICC line. Let us know if there is anything that we can do to make your therapy better!      I have been informed and understand all of the instructions given to me and have received a copy. I have been instructed to call the clinic [phone] or my family physician as soon as possible for continued medical care, if indicated. I do not have any more questions at this time but understand that I may call the Cancer Center or the Patient Navigator at [phone] during office hours should I have questions or need assistance in obtaining follow-up care.      _________________________________________      _______________     __________ Signature of Patient or Authorized Representative        Date                            Time      _________________________________________ Nurse's Signature

## 2012-05-27 ENCOUNTER — Encounter (HOSPITAL_COMMUNITY)
Admission: RE | Admit: 2012-05-27 | Discharge: 2012-05-27 | Disposition: A | Discharge status: Home / Self Care | Payer: BC Managed Care – PPO | Source: Ambulatory Visit | Attending: Oncology | Admitting: Oncology

## 2012-05-27 ENCOUNTER — Encounter (HOSPITAL_COMMUNITY): Payer: Self-pay

## 2012-05-27 DIAGNOSIS — C8190 Hodgkin lymphoma, unspecified, unspecified site: Secondary | ICD-10-CM | POA: Insufficient documentation

## 2012-05-27 MED ORDER — FLUDEOXYGLUCOSE F - 18 (FDG) INJECTION
16.2000 | Freq: Once | INTRAVENOUS | Status: AC | PRN
  Administered 2012-05-27: 16.2 via INTRAVENOUS

## 2012-05-27 MED ORDER — PROCHLORPERAZINE 25 MG RE SUPP: 25.0000 mg | Freq: Two times a day (BID) | RECTAL | Status: DC | PRN

## 2012-05-27 MED ORDER — ONDANSETRON HCL 8 MG PO TABS: ORAL_TABLET | ORAL | Status: DC

## 2012-05-27 MED ORDER — LORAZEPAM 1 MG PO TABS: 1.0000 mg | ORAL_TABLET | ORAL | Status: DC | PRN

## 2012-05-27 MED ORDER — DEXAMETHASONE 4 MG PO TABS: ORAL_TABLET | ORAL | Status: DC

## 2012-05-28 ENCOUNTER — Ambulatory Visit (HOSPITAL_COMMUNITY)
Admission: RE | Admit: 2012-05-28 | Discharge: 2012-05-28 | Disposition: A | Discharge status: Home / Self Care | Payer: BC Managed Care – PPO | Source: Ambulatory Visit | Attending: Oncology | Admitting: Oncology

## 2012-05-28 ENCOUNTER — Encounter (HOSPITAL_COMMUNITY): Payer: BC Managed Care – PPO | Attending: Oncology

## 2012-05-28 DIAGNOSIS — C8119 Nodular sclerosis classical Hodgkin lymphoma, extranodal and solid organ sites: Secondary | ICD-10-CM

## 2012-05-28 DIAGNOSIS — C8190 Hodgkin lymphoma, unspecified, unspecified site: Secondary | ICD-10-CM | POA: Insufficient documentation

## 2012-05-28 DIAGNOSIS — Z0189 Encounter for other specified special examinations: Secondary | ICD-10-CM

## 2012-05-28 DIAGNOSIS — R0602 Shortness of breath: Secondary | ICD-10-CM | POA: Insufficient documentation

## 2012-05-28 NOTE — Progress Notes (Signed)
*  PRELIMINARY RESULTS* Echocardiogram 2D Echocardiogram has been performed.  Conrad Climax 05/28/2012, 1:38 PM

## 2012-05-28 NOTE — Progress Notes (Signed)
Chemo consent not signed but teaching done for bleomycin, dacarbazine, adriamycin, and vinblastine. Pt wants to wait until coming for chemo to sign consent. I also went over sperm donation and family members donating blood for patient today. Pt wants a family member's blood if he is going to need it. Awaiting a fax on sperm donation sites. Paperwork obtained for [employer] special donations. Will alert Dr. Mariel Sleet to patient's request for this.

## 2012-05-29 ENCOUNTER — Encounter (HOSPITAL_COMMUNITY): Payer: Self-pay

## 2012-05-29 ENCOUNTER — Encounter (HOSPITAL_COMMUNITY)
Admission: RE | Admit: 2012-05-29 | Discharge: 2012-05-29 | Disposition: A | Discharge status: Home / Self Care | Payer: BC Managed Care – PPO | Source: Ambulatory Visit | Attending: General Surgery | Admitting: General Surgery

## 2012-05-29 ENCOUNTER — Other Ambulatory Visit (HOSPITAL_COMMUNITY): Payer: BC Managed Care – PPO

## 2012-05-29 HISTORY — DX: Unspecified hearing loss, bilateral: H91.93

## 2012-05-29 LAB — SURGICAL PCR SCREEN
MRSA, PCR: NEGATIVE
Staphylococcus aureus: POSITIVE — AB

## 2012-05-29 NOTE — Patient Instructions (Signed)
Implanted Port Instructions An implanted port is a central line that has a round shape and is placed under the skin. It is used for long-term IV (intravenous) access for:  Medicine.  Fluids.  Liquid nutrition, such as TPN (total parenteral nutrition).  Blood samples. Ports can be placed:  In the chest area just below the collarbone (this is the most common place.)  In the arms.  In the belly (abdomen) area.  In the legs. PARTS OF THE PORT A port has 2 main parts:  The reservoir. The reservoir is round, disc-shaped, and will be a small, raised area under your skin.  The reservoir is the part where a needle is inserted (accessed) to either give medicines or to draw blood.  The catheter. The catheter is a long, slender tube that extends from the reservoir. The catheter is placed into a large vein.  Medicine that is inserted into the reservoir goes into the catheter and then into the vein. INSERTION OF THE PORT  The port is surgically placed in either an operating room or in a procedural area (interventional radiology).  Medicine may be given to help you relax during the procedure.  The skin where the port will be inserted is numbed (local anesthetic).  1 or 2 small cuts (incisions) will be made in the skin to insert the port.  The port can be used after it has been inserted. INCISION SITE CARE  The incision site may have small adhesive strips on it. This helps keep the incision site closed. Sometimes, no adhesive strips are placed. Instead of adhesive strips, a special kind of surgical glue is used to keep the incision closed.  If adhesive strips were placed on the incision sites, do not take them off. They will fall off on their own.  The incision site may be sore for 1 to 2 days. Pain medicine can help.  Do not get the incision site wet. Bathe or shower as directed by your caregiver.  The incision site should heal in 5 to 7 days. A small scar may form after the  incision has healed. ACCESSING THE PORT Special steps must be taken to access the port:  Before the port is accessed, a numbing cream can be placed on the skin. This helps numb the skin over the port site.  A sterile technique is used to access the port.  The port is accessed with a needle. Only "non-coring" port needles should be used to access the port. Once the port is accessed, a blood return should be checked. This helps ensure the port is in the vein and is not clogged (clotted).  If your caregiver believes your port should remain accessed, a clear (transparent) bandage will be placed over the needle site. The bandage and needle will need to be changed every week or as directed by your caregiver.  Keep the bandage covering the needle clean and dry. Do not get it wet. Follow your caregiver's instructions on how to take a shower or bath when the port is accessed.  If your port does not need to stay accessed, no bandage is needed over the port. FLUSHING THE PORT Flushing the port keeps it from getting clogged. How often the port is flushed depends on:  If a constant infusion is running. If a constant infusion is running, the port may not need to be flushed.  If intermittent medicines are given.  If the port is not being used. For intermittent medicines:  The port will   need to be flushed:  After medicines have been given.  After blood has been drawn.  As part of routine maintenance.  A port is normally flushed with:  Normal saline.  Heparin.  Follow your caregiver's advice on how often, how much, and the type of flush to use on your port. IMPORTANT PORT INFORMATION  Tell your caregiver if you are allergic to heparin.  After your port is placed, you will get a manufacturer's information card. The card has information about your port. Keep this card with you at all times.  There are many types of ports available. Know what kind of port you have.  In case of an  emergency, it may be helpful to wear a medical alert bracelet. This can help alert health care workers that you have a port.  The port can stay in for as long as your caregiver believes it is necessary.  When it is time for the port to come out, surgery will be done to remove it. The surgery will be similar to how the port was put in.  If you are in the hospital or clinic:  Your port will be taken care of and flushed by a nurse.  If you are at home:  A home health care nurse may give medicines and take care of the port.  You or a family member can get special training and directions for giving medicine and taking care of the port at home. SEEK IMMEDIATE MEDICAL CARE IF:   Your port does not flush or you are unable to get a blood return.  New drainage or pus is coming from the incision.  A bad smell is coming from the incision site.  You develop swelling or increased redness at the incision site.  You develop increased swelling or pain at the port site.  You develop swelling or pain in the surrounding skin near the port.  You have an oral temperature above 102 F (38.9 C), not controlled by medicine. MAKE SURE YOU:   Understand these instructions.  Will watch your condition.  Will get help right away if you are not doing well or get worse. Document Released: 08/12/2005 Document Revised: 11/04/2011 Document Reviewed: 11/03/2008 [facility] Patient Information 2013 [place], [place].20   Samuel [name]  05/29/2012   Your procedure is scheduled on:  Wednesday, 06/03/12  Report to [facility] at 0700 AM.  Call this number if you have problems the morning of surgery: [phone]   Remember:   Do not eat food:After Midnight.  May have clear liquids:until Midnight .  Clear liquids include soda, tea, black coffee, apple or grape juice, broth.  Take these medicines the morning of surgery with A SIP OF WATER: none   Do not wear jewelry, make-up or nail polish.  Do not wear lotions,  powders, or perfumes. You may wear deodorant.  Do not shave 48 hours prior to surgery. Men may shave face and neck.  Do not bring valuables to the hospital.  Contacts, dentures or bridgework may not be worn into surgery.  Leave suitcase in the car. After surgery it may be brought to your room.  For patients admitted to the hospital, checkout time is 11:00 AM the day of discharge.   Patients discharged the day of surgery will not be allowed to drive home.  Name and phone number of your driver: wife  Special Instructions: Shower using CHG 2 nights before surgery and the night before surgery.  If you shower the day  of surgery use CHG.  Use special wash - you have one bottle of CHG for all showers.  You should use approximately 1/3 of the bottle for each shower.   Please read over the following fact sheets that you were given: Pain Booklet, MRSA Information, Surgical Site Infection Prevention, Anesthesia Post-op Instructions and Care and Recovery After Surgery

## 2012-05-31 NOTE — Procedures (Signed)
NAME:  Samuel [name], Samuel [name]                 ACCOUNT NO.:  [id number]  MEDICAL RECORD NO.:  [id number]  LOCATION:  DOIB                          FACILITY:  [facility]  PHYSICIAN:  Tariq Pernell L. Juanetta Gosling, M.D.DATE OF BIRTH:  [birth year]  DATE OF PROCEDURE: DATE OF DISCHARGE:  05/29/2012                           PULMONARY FUNCTION TEST   Reason for pulmonary function testing is Hodgkin lymphoma. 1. Spirometry shows no ventilatory defect and no evidence of airflow     obstruction.  He may get a mild ventilatory defect with no airflow     obstruction. 2. Lung volumes are normal. 3. DLCO is normal. 4. Airway resistance is elevated and may indicate some airflow     obstruction not seen on spirometry.     Cicily Bonano L. Juanetta Gosling, M.D.     ELH/MEDQ  D:  05/31/2012  T:  05/31/2012  Job:  [id number]  cc:   Ladona Horns. Mariel Sleet, MD Fax: [phone]

## 2012-06-01 ENCOUNTER — Encounter (HOSPITAL_BASED_OUTPATIENT_CLINIC_OR_DEPARTMENT_OTHER): Payer: BC Managed Care – PPO | Admitting: Oncology

## 2012-06-01 ENCOUNTER — Ambulatory Visit (HOSPITAL_COMMUNITY): Payer: BC Managed Care – PPO

## 2012-06-01 ENCOUNTER — Ambulatory Visit (HOSPITAL_COMMUNITY): Payer: BC Managed Care – PPO | Admitting: Oncology

## 2012-06-01 ENCOUNTER — Ambulatory Visit (HOSPITAL_COMMUNITY): Admission: RE | Admit: 2012-06-01 | Payer: BC Managed Care – PPO | Source: Ambulatory Visit

## 2012-06-01 ENCOUNTER — Encounter (HOSPITAL_COMMUNITY): Payer: Self-pay | Admitting: Pharmacy Technician

## 2012-06-01 VITALS — BP 127/64 | HR 78 | Temp 98.4°F | Resp 18 | Wt 193.2 lb

## 2012-06-01 DIAGNOSIS — C8190 Hodgkin lymphoma, unspecified, unspecified site: Secondary | ICD-10-CM

## 2012-06-01 DIAGNOSIS — C8119 Nodular sclerosis classical Hodgkin lymphoma, extranodal and solid organ sites: Secondary | ICD-10-CM

## 2012-06-01 MED ORDER — ALLOPURINOL 300 MG PO TABS: 300.0000 mg | ORAL_TABLET | Freq: Every day | ORAL | Status: DC

## 2012-06-01 NOTE — Patient Instructions (Addendum)
[facility]  Discharge Instructions  RECOMMENDATIONS MADE BY THE CONSULTANT AND ANY TEST RESULTS WILL BE SENT TO YOUR REFERRING DOCTOR.   EXAM FINDINGS BY MD TODAY AND SIGNS AND SYMPTOMS TO REPORT TO CLINIC OR PRIMARY MD: PET showed bulky lymph node involvement in your chest which can cause the shortness of breath that you are experiencing.  Your bone marrow was normal with no evidence of disease.  A cycle is 28 days and you will receive 6 cycles of therapy (treated every 14 days)  Whenever you need your FMLA forms completed bring them in and we will get them completed for you.  MEDICATIONS PRESCRIBED: Allopurinol take 1 daily and start them today. Follow label directions  INSTRUCTIONS GIVEN AND DISCUSSED: Other : Call with any questions or concerns.  Make sure any MD knows that you have had bleomycin - especially if you have to be put to sleep for anything.  SPECIAL INSTRUCTIONS/FOLLOW-UP: Return to Clinic as scheduled.   I acknowledge that I have been informed and understand all the instructions given to me and received a copy. I do not have any more questions at this time, but understand that I may call the Specialty Clinic at [facility] at [phone] during business hours should I have any further questions or need assistance in obtaining follow-up care.    __________________________________________  _____________  __________ Signature of Patient or Authorized Representative            Date                   Time    __________________________________________ Nurse's Signature

## 2012-06-02 ENCOUNTER — Inpatient Hospital Stay (HOSPITAL_COMMUNITY): Payer: BC Managed Care – PPO

## 2012-06-02 ENCOUNTER — Other Ambulatory Visit (HOSPITAL_COMMUNITY): Payer: Self-pay | Admitting: *Deleted

## 2012-06-02 DIAGNOSIS — C8190 Hodgkin lymphoma, unspecified, unspecified site: Secondary | ICD-10-CM

## 2012-06-02 NOTE — Progress Notes (Signed)
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 He has bulky mediastinal disease however and will need chemotherapy by involved field radiation therapy. He is very seen the radiation therapist. He will have his Port-A-Cath placed soon but still needs a 2-D echo and pulmonary function studies. He is here today with his wife and they are considering sperm banking. We have given them the number of the [facility] sperm bank and I suspect they will work him in very quickly. He is already scheduled for his Port-A-Cath placement in the very near future and then we will initiate chemotherapy. We went over the side effects of therapy and they're ready to proceed. I do not know whether he will be able to work during this regimen but he knows that we are willing and able to fill it his FMLA forms as needed.

## 2012-06-03 ENCOUNTER — Ambulatory Visit (HOSPITAL_COMMUNITY): Payer: BC Managed Care – PPO

## 2012-06-03 ENCOUNTER — Ambulatory Visit (HOSPITAL_COMMUNITY)
Admission: RE | Admit: 2012-06-03 | Discharge: 2012-06-03 | Disposition: A | Discharge status: Home / Self Care | Payer: BC Managed Care – PPO | Source: Ambulatory Visit | Attending: General Surgery | Admitting: General Surgery

## 2012-06-03 ENCOUNTER — Other Ambulatory Visit (HOSPITAL_COMMUNITY): Payer: Self-pay | Admitting: *Deleted

## 2012-06-03 ENCOUNTER — Ambulatory Visit (HOSPITAL_COMMUNITY): Payer: BC Managed Care – PPO | Admitting: Anesthesiology

## 2012-06-03 ENCOUNTER — Encounter (HOSPITAL_COMMUNITY): Payer: Self-pay | Admitting: Anesthesiology

## 2012-06-03 ENCOUNTER — Encounter (HOSPITAL_COMMUNITY)
Admission: RE | Disposition: A | Discharge status: Home / Self Care | Payer: Self-pay | Source: Ambulatory Visit | Attending: General Surgery

## 2012-06-03 ENCOUNTER — Encounter (HOSPITAL_COMMUNITY): Payer: Self-pay | Admitting: *Deleted

## 2012-06-03 DIAGNOSIS — C8190 Hodgkin lymphoma, unspecified, unspecified site: Secondary | ICD-10-CM

## 2012-06-03 HISTORY — PX: PORTACATH PLACEMENT: SHX2246

## 2012-06-03 SURGERY — INSERTION, TUNNELED CENTRAL VENOUS DEVICE, WITH PORT
Anesthesia: Monitor Anesthesia Care | Site: Chest | Laterality: Left | Wound class: Clean

## 2012-06-03 MED ORDER — HEPARIN SOD (PORK) LOCK FLUSH 100 UNIT/ML IV SOLN
INTRAVENOUS | Status: AC
  Filled 2012-06-03: qty 5

## 2012-06-03 MED ORDER — LACTATED RINGERS IV SOLN
INTRAVENOUS | Status: DC
  Administered 2012-06-03: 08:00:00 via INTRAVENOUS

## 2012-06-03 MED ORDER — FENTANYL CITRATE 0.05 MG/ML IJ SOLN
INTRAMUSCULAR | Status: DC | PRN
  Administered 2012-06-03 (×2): 50 ug via INTRAVENOUS

## 2012-06-03 MED ORDER — VANCOMYCIN HCL IN DEXTROSE 1-5 GM/200ML-% IV SOLN
INTRAVENOUS | Status: AC
  Filled 2012-06-03: qty 200

## 2012-06-03 MED ORDER — SODIUM CHLORIDE 0.9 % IV SOLN
INTRAVENOUS | Status: DC | PRN
  Administered 2012-06-03: 1000 mL via INTRAMUSCULAR

## 2012-06-03 MED ORDER — PROPOFOL INFUSION 10 MG/ML OPTIME
INTRAVENOUS | Status: DC | PRN
  Administered 2012-06-03: 75 ug/kg/min via INTRAVENOUS

## 2012-06-03 MED ORDER — KETOROLAC TROMETHAMINE 30 MG/ML IJ SOLN
INTRAMUSCULAR | Status: AC
  Filled 2012-06-03: qty 1

## 2012-06-03 MED ORDER — PROPOFOL 10 MG/ML IV BOLUS
INTRAVENOUS | Status: DC | PRN
  Administered 2012-06-03 (×2): 10 mg via INTRAVENOUS

## 2012-06-03 MED ORDER — MIDAZOLAM HCL 2 MG/2ML IJ SOLN
INTRAMUSCULAR | Status: AC
  Filled 2012-06-03: qty 2

## 2012-06-03 MED ORDER — HEPARIN SOD (PORK) LOCK FLUSH 100 UNIT/ML IV SOLN
INTRAVENOUS | Status: DC | PRN
  Administered 2012-06-03: 500 [IU] via INTRAVENOUS

## 2012-06-03 MED ORDER — LIDOCAINE HCL (PF) 1 % IJ SOLN
INTRAMUSCULAR | Status: AC
  Filled 2012-06-03: qty 30

## 2012-06-03 MED ORDER — LIDOCAINE HCL (PF) 1 % IJ SOLN
INTRAMUSCULAR | Status: DC | PRN
  Administered 2012-06-03: 7 mL

## 2012-06-03 MED ORDER — MUPIROCIN 2 % EX OINT
TOPICAL_OINTMENT | CUTANEOUS | Status: AC
  Filled 2012-06-03: qty 22

## 2012-06-03 MED ORDER — FENTANYL CITRATE 0.05 MG/ML IJ SOLN: 25.0000 ug | INTRAMUSCULAR | Status: DC | PRN

## 2012-06-03 MED ORDER — KETOROLAC TROMETHAMINE 30 MG/ML IJ SOLN
30.0000 mg | Freq: Once | INTRAMUSCULAR | Status: AC
  Administered 2012-06-03: 30 mg via INTRAVENOUS

## 2012-06-03 MED ORDER — FENTANYL CITRATE 0.05 MG/ML IJ SOLN
INTRAMUSCULAR | Status: AC
  Filled 2012-06-03: qty 2

## 2012-06-03 MED ORDER — VANCOMYCIN HCL IN DEXTROSE 1-5 GM/200ML-% IV SOLN
1000.0000 mg | INTRAVENOUS | Status: AC
  Administered 2012-06-03: 1000 mg via INTRAVENOUS

## 2012-06-03 MED ORDER — MIDAZOLAM HCL 2 MG/2ML IJ SOLN
1.0000 mg | INTRAMUSCULAR | Status: DC | PRN
  Administered 2012-06-03: 2 mg via INTRAVENOUS

## 2012-06-03 MED ORDER — ONDANSETRON HCL 4 MG/2ML IJ SOLN: 4.0000 mg | Freq: Once | INTRAMUSCULAR | Status: DC | PRN

## 2012-06-03 MED ORDER — CHLORHEXIDINE GLUCONATE 4 % EX LIQD: 1.0000 "application " | Freq: Once | CUTANEOUS | Status: DC

## 2012-06-03 MED ORDER — HYDROCODONE-ACETAMINOPHEN 5-500 MG PO TABS: 1.0000 | ORAL_TABLET | ORAL | Status: DC | PRN

## 2012-06-03 SURGICAL SUPPLY — 34 items
APPLIER CLIP 9.375 SM OPEN (CLIP)
BAG DECANTER FOR FLEXI CONT (MISCELLANEOUS) ×2 IMPLANT
BAG HAMPER (MISCELLANEOUS) ×2 IMPLANT
CATH HICKMAN DUAL 12.0 (CATHETERS) IMPLANT
CLIP APPLIE 9.375 SM OPEN (CLIP) IMPLANT
CLOTH BEACON ORANGE TIMEOUT ST (SAFETY) ×2 IMPLANT
COVER LIGHT HANDLE STERIS (MISCELLANEOUS) ×4 IMPLANT
DECANTER SPIKE VIAL GLASS SM (MISCELLANEOUS) ×2 IMPLANT
DERMABOND ADVANCED (GAUZE/BANDAGES/DRESSINGS) ×1
DERMABOND ADVANCED .7 DNX12 (GAUZE/BANDAGES/DRESSINGS) ×1 IMPLANT
DRAPE C-ARM FOLDED MOBILE STRL (DRAPES) ×2 IMPLANT
DURAPREP 26ML APPLICATOR (WOUND CARE) ×2 IMPLANT
ELECT REM PT RETURN 9FT ADLT (ELECTROSURGICAL) ×2
ELECTRODE REM PT RTRN 9FT ADLT (ELECTROSURGICAL) ×1 IMPLANT
GLOVE BIO SURGEON STRL SZ7.5 (GLOVE) ×2 IMPLANT
GOWN STRL REIN XL XLG (GOWN DISPOSABLE) ×4 IMPLANT
IV NS 500ML (IV SOLUTION) ×1
IV NS 500ML BAXH (IV SOLUTION) ×1 IMPLANT
KIT PORT POWER 8FR ISP MRI (CATHETERS) ×2 IMPLANT
KIT ROOM TURNOVER APOR (KITS) ×2 IMPLANT
MANIFOLD NEPTUNE II (INSTRUMENTS) ×2 IMPLANT
NEEDLE HYPO 25X1 1.5 SAFETY (NEEDLE) ×2 IMPLANT
PACK MINOR (CUSTOM PROCEDURE TRAY) ×2 IMPLANT
PAD ARMBOARD 7.5X6 YLW CONV (MISCELLANEOUS) ×2 IMPLANT
SET BASIN LINEN APH (SET/KITS/TRAYS/PACK) ×2 IMPLANT
SET INTRODUCER 12FR PACEMAKER (SHEATH) IMPLANT
SHEATH COOK PEEL AWAY SET 8F (SHEATH) IMPLANT
SUT PROLENE 3 0 PS 2 (SUTURE) IMPLANT
SUT VIC AB 3-0 SH 27 (SUTURE) ×1
SUT VIC AB 3-0 SH 27X BRD (SUTURE) ×1 IMPLANT
SUT VIC AB 4-0 PS2 27 (SUTURE) ×2 IMPLANT
SYR 20CC LL (SYRINGE) ×2 IMPLANT
SYR CONTROL 10ML LL (SYRINGE) ×2 IMPLANT
SYRINGE 10CC LL (SYRINGE) ×2 IMPLANT

## 2012-06-03 NOTE — Transfer of Care (Signed)
Immediate Anesthesia Transfer of Care Note  Patient: Samuel [name]  Procedure(s) Performed: Procedure(s) (LRB) with comments: INSERTION PORT-A-CATH (Left) - Insertion of Port-A-Cath Left Subclavian  Patient Location: PACU  Anesthesia Type: MAC  Level of Consciousness: awake, alert  and oriented  Airway & Oxygen Therapy: Patient Spontanous Breathing  Post-op Assessment: Report given to PACU RN  Post vital signs: Reviewed  Complications: No apparent anesthesia complications

## 2012-06-03 NOTE — Anesthesia Postprocedure Evaluation (Signed)
  Anesthesia Post-op Note  Patient: Samuel [name]  Procedure(s) Performed: Procedure(s) (LRB) with comments: INSERTION PORT-A-CATH (Left) - Insertion of Port-A-Cath Left Subclavian  Patient Location: PACU  Anesthesia Type: MAC  Level of Consciousness: awake, alert  and oriented  Airway and Oxygen Therapy: Patient Spontanous Breathing  Post-op Pain: none  Post-op Assessment: Post-op Vital signs reviewed, Patient's Cardiovascular Status Stable, Respiratory Function Stable, Patent Airway and No signs of Nausea or vomiting  Post-op Vital Signs: Reviewed and stable  Complications: No apparent anesthesia complications

## 2012-06-03 NOTE — Op Note (Signed)
Patient:  Samuel [name]  DOB:  1976/03/11  MRN:  [id number]   Preop Diagnosis:  Lymphoma, need for central venous access  Postop Diagnosis:  Same  Procedure:  Port-A-Cath insertion  Surgeon:  Franky Macho, M.D.  Anes:  MAC  Indications:  Patient is a 36 year old white male who is about to undergo chemotherapy for lymphoma. Risks and benefits of the procedure including bleeding, infection, and pneumothorax were fully explained to the patient, gave informed consent.  Procedure note:  Patient was placed in the Trendelenburg position after monitored anesthesia care was given. 1% Xylocaine was used for local anesthesia. Left upper chest was prepped and draped using the usual sterile technique with DuraPrep. Surgical site confirmation was performed.  An incision was made below the left clavicle. Subcutaneous pocket was then formed. A needle is advanced into the left subclavian vein using the Seldinger technique without difficulty. A guidewire was then advanced under fluoroscopic guidance. An introducer and peel-away sheath were placed over the guidewire. The guidewire was removed and the catheter inserted through the peel-away sheath. The peel-away sheath was removed and the catheter was attached to the power port and the power port placed in its subcutaneous pocket. Adequate positioning was confirmed by fluoroscopy. Good backflow was noted in the port. The port was flushed with heparin flush. The subcutaneous layer was reapproximated using 3-0 Vicryl interrupted suture. The skin was closed using a 4-0 Vicryl subcuticular suture. Dermabond was applied.  All tape and needle counts were correct the end of the procedure. The patient was  transferred to PACU in stable condition. A chest x-ray will be performed at that time.  Complications:  None  EBL:  Minimal  Specimen:  None

## 2012-06-03 NOTE — Anesthesia Preprocedure Evaluation (Addendum)
Anesthesia Evaluation  Patient identified by MRN, date of birth, ID band Patient awake    Reviewed: Allergy & Precautions, H&P , NPO status , Patient's Chart, lab work & pertinent test results, reviewed documented beta blocker date and time   History of Anesthesia Complications Negative for: history of anesthetic complications  Airway Mallampati: II TM Distance: >3 FB Neck ROM: full    Dental  (+) Teeth Intact   Pulmonary neg pulmonary ROS,  breath sounds clear to auscultation        Cardiovascular negative cardio ROS  Rhythm:regular     Neuro/Psych negative neurological ROS  negative psych ROS   GI/Hepatic negative GI ROS, Neg liver ROS,   Endo/Other  negative endocrine ROS  Renal/GU negative Renal ROS  negative genitourinary   Musculoskeletal   Abdominal   Peds  Hematology Hodgkin's Lymphoma   Anesthesia Other Findings See surgeon's H&P   Reproductive/Obstetrics negative OB ROS                          Anesthesia Physical Anesthesia Plan  ASA: II  Anesthesia Plan: MAC   Post-op Pain Management:    Induction: Intravenous  Airway Management Planned: Nasal Cannula  Additional Equipment:   Intra-op Plan:   Post-operative Plan:   Informed Consent: I have reviewed the patients History and Physical, chart, labs and discussed the procedure including the risks, benefits and alternatives for the proposed anesthesia with the patient or authorized representative who has indicated his/her understanding and acceptance.     Plan Discussed with:   Anesthesia Plan Comments:         Anesthesia Quick Evaluation

## 2012-06-03 NOTE — Interval H&P Note (Signed)
History and Physical Interval Note:  06/03/2012 8:34 AM  Samuel [name]  has presented today for surgery, with the diagnosis of Lymphoma  The various methods of treatment have been discussed with the patient and family. After consideration of risks, benefits and other options for treatment, the patient has consented to  Procedure(s) (LRB) with comments: INSERTION PORT-A-CATH (N/A) as a surgical intervention .  The patient's history has been reviewed, patient examined, no change in status, stable for surgery.  I have reviewed the patient's chart and labs.  Questions were answered to the patient's satisfaction.     Franky Macho A

## 2012-06-04 ENCOUNTER — Ambulatory Visit (HOSPITAL_COMMUNITY): Payer: BC Managed Care – PPO | Admitting: Oncology

## 2012-06-04 ENCOUNTER — Encounter (HOSPITAL_COMMUNITY): Payer: Self-pay | Admitting: General Surgery

## 2012-06-05 ENCOUNTER — Inpatient Hospital Stay (HOSPITAL_COMMUNITY): Payer: BC Managed Care – PPO

## 2012-06-09 ENCOUNTER — Encounter (HOSPITAL_BASED_OUTPATIENT_CLINIC_OR_DEPARTMENT_OTHER): Payer: BC Managed Care – PPO

## 2012-06-09 DIAGNOSIS — C8190 Hodgkin lymphoma, unspecified, unspecified site: Secondary | ICD-10-CM

## 2012-06-09 DIAGNOSIS — C9210 Chronic myeloid leukemia, BCR/ABL-positive, not having achieved remission: Secondary | ICD-10-CM

## 2012-06-09 DIAGNOSIS — Z5111 Encounter for antineoplastic chemotherapy: Secondary | ICD-10-CM

## 2012-06-09 LAB — COMPREHENSIVE METABOLIC PANEL
ALT: 44 U/L (ref 0–53)
AST: 29 U/L (ref 0–37)
Alkaline Phosphatase: 112 U/L (ref 39–117)
CO2: 25 mEq/L (ref 19–32)
Calcium: 9.5 mg/dL (ref 8.4–10.5)
GFR calc Af Amer: >90 mL/min (ref >90)
GFR calc non Af Amer: >90 mL/min (ref >90)
Glucose, Bld: 164 mg/dL — ABNORMAL HIGH (ref 70–99)
Potassium: 3.7 mEq/L (ref 3.5–5.1)
Sodium: 135 mEq/L (ref 135–145)

## 2012-06-09 LAB — CBC WITH DIFFERENTIAL/PLATELET
Basophils Absolute: 0 10*3/uL (ref 0.0–0.1)
Eosinophils Relative: 1 % (ref 0–5)
Lymphocytes Relative: 12 % (ref 12–46)
Lymphs Abs: 1.6 10*3/uL (ref 0.7–4.0)
Neutro Abs: 10.4 10*3/uL — ABNORMAL HIGH (ref 1.7–7.7)
Neutrophils Relative %: 80 % — ABNORMAL HIGH (ref 43–77)
Platelets: 215 10*3/uL (ref 150–400)
RBC: 4.91 MIL/uL (ref 4.22–5.81)
RDW: 14.5 % (ref 11.5–15.5)
WBC: 12.9 10*3/uL — ABNORMAL HIGH (ref 4.0–10.5)

## 2012-06-09 LAB — PULMONARY FUNCTION TEST

## 2012-06-09 MED ORDER — SODIUM CHLORIDE 0.9 % IV SOLN
Freq: Once | INTRAVENOUS | Status: AC
  Administered 2012-06-09: 10:00:00 via INTRAVENOUS

## 2012-06-09 MED ORDER — SODIUM CHLORIDE 0.9 % IV SOLN
Freq: Once | INTRAVENOUS | Status: AC
  Administered 2012-06-09: 16 mg via INTRAVENOUS
  Filled 2012-06-09: qty 8

## 2012-06-09 MED ORDER — VINBLASTINE SULFATE CHEMO INJECTION 1 MG/ML
6.0000 mg/m2 | Freq: Once | INTRAVENOUS | Status: AC
  Administered 2012-06-09: 12.2 mg via INTRAVENOUS
  Filled 2012-06-09: qty 12.2

## 2012-06-09 MED ORDER — SODIUM CHLORIDE 0.9 % IV SOLN
1.0000 [IU] | Freq: Once | INTRAVENOUS | Status: AC
  Administered 2012-06-09: 1 [IU] via INTRAVENOUS
  Filled 2012-06-09: qty 0.3

## 2012-06-09 MED ORDER — SODIUM CHLORIDE 0.9 % IJ SOLN
10.0000 mL | INTRAMUSCULAR | Status: DC | PRN
  Administered 2012-06-09: 10 mL
  Filled 2012-06-09: qty 10

## 2012-06-09 MED ORDER — SODIUM CHLORIDE 0.9 % IV SOLN: 10.0000 [IU]/m2 | Freq: Once | INTRAVENOUS | Status: DC

## 2012-06-09 MED ORDER — HEPARIN SOD (PORK) LOCK FLUSH 100 UNIT/ML IV SOLN
500.0000 [IU] | Freq: Once | INTRAVENOUS | Status: AC | PRN
  Administered 2012-06-09: 500 [IU]
  Filled 2012-06-09: qty 5

## 2012-06-09 MED ORDER — ONDANSETRON HCL 40 MG/20ML IJ SOLN: 16.0000 mg | Freq: Once | INTRAMUSCULAR | Status: DC

## 2012-06-09 MED ORDER — DEXAMETHASONE SODIUM PHOSPHATE 10 MG/ML IJ SOLN: 20.0000 mg | Freq: Once | INTRAMUSCULAR | Status: DC

## 2012-06-09 MED ORDER — SODIUM CHLORIDE 0.9 % IV SOLN
375.0000 mg/m2 | Freq: Once | INTRAVENOUS | Status: AC
  Administered 2012-06-09: 760 mg via INTRAVENOUS
  Filled 2012-06-09: qty 38

## 2012-06-09 MED ORDER — SODIUM CHLORIDE 0.9 % IV SOLN
19.0000 [IU] | Freq: Once | INTRAVENOUS | Status: AC
  Administered 2012-06-09: 19 [IU] via INTRAVENOUS
  Filled 2012-06-09: qty 6.3

## 2012-06-09 MED ORDER — DOXORUBICIN HCL CHEMO IV INJECTION 2 MG/ML
25.0000 mg/m2 | Freq: Once | INTRAVENOUS | Status: AC
  Administered 2012-06-09: 52 mg via INTRAVENOUS
  Filled 2012-06-09: qty 26

## 2012-06-09 NOTE — Progress Notes (Signed)
Tolerated chemo well. 

## 2012-06-10 ENCOUNTER — Telehealth (HOSPITAL_COMMUNITY): Payer: Self-pay

## 2012-06-10 NOTE — Telephone Encounter (Signed)
Patient doing ok today. He states that he has heartburn and that his legs feel a little weak. Otherwise, he is doing good.

## 2012-06-11 ENCOUNTER — Other Ambulatory Visit (HOSPITAL_COMMUNITY): Payer: Self-pay | Admitting: Oncology

## 2012-06-11 DIAGNOSIS — R066 Hiccough: Secondary | ICD-10-CM

## 2012-06-11 MED ORDER — CHLORPROMAZINE HCL 25 MG PO TABS: 25.0000 mg | ORAL_TABLET | Freq: Three times a day (TID) | ORAL | Status: DC

## 2012-06-11 NOTE — Addendum Note (Signed)
Addended by: Oda Kilts on: 06/11/2012 03:25 PM   Modules accepted: Orders

## 2012-06-16 ENCOUNTER — Encounter: Payer: Self-pay | Admitting: Oncology

## 2012-06-16 ENCOUNTER — Telehealth (HOSPITAL_COMMUNITY): Payer: Self-pay | Admitting: *Deleted

## 2012-06-16 ENCOUNTER — Inpatient Hospital Stay (HOSPITAL_COMMUNITY): Payer: BC Managed Care – PPO

## 2012-06-16 NOTE — Telephone Encounter (Signed)
I called to check on patient today. Hiccups stopped. He did not take the Thorazine because he didn't like the side effects that were listed on the drug. I did explain to patient that we were giving this drug for hiccups and not psychosis or mental problems. Heartburn was present for several days after chemo but now it is gone. Patient having some nausea. Has not taken any nausea medicine. Pt instructed to take nausea medicine if needed.   Soreness to "love handles" and soreness in buttocks today. Rt sided jaw pain yday (it has stopped).  Mild pain rarely in the stomach area that comes and goes. Shooting pain that was in the groin the day after chemo is gone.

## 2012-06-19 ENCOUNTER — Inpatient Hospital Stay (HOSPITAL_COMMUNITY): Payer: BC Managed Care – PPO

## 2012-06-22 ENCOUNTER — Other Ambulatory Visit (HOSPITAL_COMMUNITY): Payer: Self-pay | Admitting: Oncology

## 2012-06-22 ENCOUNTER — Encounter (HOSPITAL_COMMUNITY): Payer: BC Managed Care – PPO

## 2012-06-22 VITALS — BP 132/75 | HR 81 | Temp 98.3°F | Resp 16 | Wt 200.6 lb

## 2012-06-22 DIAGNOSIS — C8190 Hodgkin lymphoma, unspecified, unspecified site: Secondary | ICD-10-CM

## 2012-06-22 LAB — CBC WITH DIFFERENTIAL/PLATELET
Basophils Absolute: 0 10*3/uL (ref 0.0–0.1)
HCT: 34.6 % — ABNORMAL LOW (ref 39.0–52.0)
Hemoglobin: 11.4 g/dL — ABNORMAL LOW (ref 13.0–17.0)
Lymphocytes Relative: 69 % — ABNORMAL HIGH (ref 12–46)
Monocytes Absolute: 0.6 10*3/uL (ref 0.1–1.0)
Neutro Abs: 0 10*3/uL — ABNORMAL LOW (ref 1.7–7.7)
RDW: 15.2 % (ref 11.5–15.5)
WBC: 2.3 10*3/uL — ABNORMAL LOW (ref 4.0–10.5)

## 2012-06-22 LAB — COMPREHENSIVE METABOLIC PANEL
ALT: 67 U/L — ABNORMAL HIGH (ref 0–53)
AST: 33 U/L (ref 0–37)
Alkaline Phosphatase: 83 U/L (ref 39–117)
CO2: 26 mEq/L (ref 19–32)
Chloride: 103 mEq/L (ref 96–112)
Creatinine, Ser: 0.72 mg/dL (ref 0.50–1.35)
GFR calc non Af Amer: >90 mL/min (ref >90)
Total Bilirubin: 0.4 mg/dL (ref 0.3–1.2)

## 2012-06-22 MED ORDER — HEPARIN SOD (PORK) LOCK FLUSH 100 UNIT/ML IV SOLN
INTRAVENOUS | Status: AC
  Filled 2012-06-22: qty 5

## 2012-06-22 MED ORDER — SODIUM CHLORIDE 0.9 % IJ SOLN
INTRAMUSCULAR | Status: AC
  Filled 2012-06-22: qty 10

## 2012-06-22 NOTE — Progress Notes (Signed)
Chemo held today due to decreased blood counts.

## 2012-06-30 ENCOUNTER — Inpatient Hospital Stay (HOSPITAL_COMMUNITY): Payer: BC Managed Care – PPO

## 2012-06-30 ENCOUNTER — Ambulatory Visit (HOSPITAL_COMMUNITY): Payer: BC Managed Care – PPO | Admitting: Oncology

## 2012-07-01 ENCOUNTER — Encounter (HOSPITAL_COMMUNITY): Payer: BC Managed Care – PPO | Attending: Oncology

## 2012-07-01 VITALS — BP 124/74 | HR 105 | Resp 20 | Wt 204.2 lb

## 2012-07-01 DIAGNOSIS — Z5111 Encounter for antineoplastic chemotherapy: Secondary | ICD-10-CM

## 2012-07-01 DIAGNOSIS — C8190 Hodgkin lymphoma, unspecified, unspecified site: Secondary | ICD-10-CM | POA: Insufficient documentation

## 2012-07-01 DIAGNOSIS — C8119 Nodular sclerosis classical Hodgkin lymphoma, extranodal and solid organ sites: Secondary | ICD-10-CM

## 2012-07-01 LAB — CBC WITH DIFFERENTIAL/PLATELET
Basophils Absolute: 0.1 10*3/uL (ref 0.0–0.1)
Basophils Relative: 1 % (ref 0–1)
Eosinophils Absolute: 0.1 10*3/uL (ref 0.0–0.7)
Eosinophils Relative: 1 % (ref 0–5)
HCT: 38.5 % — ABNORMAL LOW (ref 39.0–52.0)
Hemoglobin: 12.8 g/dL — ABNORMAL LOW (ref 13.0–17.0)
Lymphocytes Relative: 36 % (ref 12–46)
Lymphs Abs: 3.2 10*3/uL (ref 0.7–4.0)
MCH: 26.9 pg (ref 26.0–34.0)
MCHC: 33.2 g/dL (ref 30.0–36.0)
MCV: 80.9 fL (ref 78.0–100.0)
Monocytes Absolute: 0.7 10*3/uL (ref 0.1–1.0)
Monocytes Relative: 8 % (ref 3–12)
Neutro Abs: 5 10*3/uL (ref 1.7–7.7)
Neutrophils Relative %: 55 % (ref 43–77)
Platelets: 162 10*3/uL (ref 150–400)
RBC: 4.76 MIL/uL (ref 4.22–5.81)
RDW: 15.5 % (ref 11.5–15.5)
WBC: 9 10*3/uL (ref 4.0–10.5)

## 2012-07-01 MED ORDER — SODIUM CHLORIDE 0.9 % IV SOLN
Freq: Once | INTRAVENOUS | Status: AC
  Administered 2012-07-01: 11:00:00 via INTRAVENOUS

## 2012-07-01 MED ORDER — HEPARIN SOD (PORK) LOCK FLUSH 100 UNIT/ML IV SOLN
500.0000 [IU] | Freq: Once | INTRAVENOUS | Status: AC | PRN
  Administered 2012-07-01: 500 [IU]
  Filled 2012-07-01: qty 5

## 2012-07-01 MED ORDER — DOXORUBICIN HCL CHEMO IV INJECTION 2 MG/ML
25.0000 mg/m2 | Freq: Once | INTRAVENOUS | Status: AC
  Administered 2012-07-01: 52 mg via INTRAVENOUS
  Filled 2012-07-01: qty 26

## 2012-07-01 MED ORDER — SODIUM CHLORIDE 0.9 % IV SOLN
375.0000 mg/m2 | Freq: Once | INTRAVENOUS | Status: AC
  Administered 2012-07-01: 760 mg via INTRAVENOUS
  Filled 2012-07-01: qty 38

## 2012-07-01 MED ORDER — SODIUM CHLORIDE 0.9 % IV SOLN: 16.0000 mg | Freq: Once | INTRAVENOUS | Status: DC

## 2012-07-01 MED ORDER — SODIUM CHLORIDE 0.9 % IV SOLN
10.0000 [IU]/m2 | Freq: Once | INTRAVENOUS | Status: AC
  Administered 2012-07-01: 20 [IU] via INTRAVENOUS
  Filled 2012-07-01: qty 6.7

## 2012-07-01 MED ORDER — HEPARIN SOD (PORK) LOCK FLUSH 100 UNIT/ML IV SOLN
INTRAVENOUS | Status: AC
  Filled 2012-07-01: qty 5

## 2012-07-01 MED ORDER — VINBLASTINE SULFATE CHEMO INJECTION 1 MG/ML: 6.0000 mg/m2 | Freq: Once | INTRAVENOUS | Status: DC

## 2012-07-01 MED ORDER — VINBLASTINE SULFATE CHEMO INJECTION 1 MG/ML
6.0000 mg/m2 | Freq: Once | INTRAVENOUS | Status: AC
  Administered 2012-07-01: 12.2 mg via INTRAVENOUS
  Filled 2012-07-01: qty 12.2

## 2012-07-01 MED ORDER — SODIUM CHLORIDE 0.9 % IV SOLN
Freq: Once | INTRAVENOUS | Status: AC
  Administered 2012-07-01: 16 mg via INTRAVENOUS
  Filled 2012-07-01: qty 8

## 2012-07-01 MED ORDER — DEXAMETHASONE SODIUM PHOSPHATE 10 MG/ML IJ SOLN: 20.0000 mg | Freq: Once | INTRAMUSCULAR | Status: DC

## 2012-07-01 MED ORDER — SODIUM CHLORIDE 0.9 % IJ SOLN
10.0000 mL | INTRAMUSCULAR | Status: DC | PRN
  Administered 2012-07-01: 10 mL
  Filled 2012-07-01: qty 10

## 2012-07-01 NOTE — Progress Notes (Signed)
Tolerated well

## 2012-07-02 ENCOUNTER — Ambulatory Visit (HOSPITAL_COMMUNITY): Payer: BC Managed Care – PPO

## 2012-07-02 ENCOUNTER — Telehealth (HOSPITAL_COMMUNITY): Payer: Self-pay | Admitting: *Deleted

## 2012-07-02 NOTE — Telephone Encounter (Signed)
Pt called this am and stated that he really had been thinking about the Neulasta shot and did not want to take it. He wants to see if his body can heal itself by next tx cycle. He stated that he was more nervous about getting the Neulasta injection than he is the chemo and states that all of this has happened so fast. I explained to him that it was his right to refuse tx but I wanted to be sure that he understood why it was so important to take the Neulasta injection. I explained to him that this shot was being given so that he can stay on track with chemotherapy. I explained that cure is the goal and that for maximum cell death to occur we needed to stay on track with chemo and the neulasta injection was our best bet for that. He stated that he understood all of that and that he wants to be cured of the HD but he wants to try one more time without the Neulasta to see how his body is going to respond. He said that if he received the Neulasta injection today and his blood cells were good then he wouldn't know if it was his body that made the WBCs stay up or if it was the Neulasta. Dr. Mariel Sleet said that if he wants to try one more time without the Neulasta that that was fine.

## 2012-07-03 ENCOUNTER — Ambulatory Visit (HOSPITAL_COMMUNITY): Payer: BC Managed Care – PPO | Admitting: Oncology

## 2012-07-03 ENCOUNTER — Inpatient Hospital Stay (HOSPITAL_COMMUNITY): Payer: BC Managed Care – PPO

## 2012-07-06 ENCOUNTER — Inpatient Hospital Stay (HOSPITAL_COMMUNITY): Payer: BC Managed Care – PPO

## 2012-07-07 ENCOUNTER — Encounter (HOSPITAL_BASED_OUTPATIENT_CLINIC_OR_DEPARTMENT_OTHER): Payer: BC Managed Care – PPO | Admitting: Oncology

## 2012-07-07 ENCOUNTER — Encounter (HOSPITAL_COMMUNITY): Payer: Self-pay | Admitting: Oncology

## 2012-07-07 ENCOUNTER — Ambulatory Visit (HOSPITAL_COMMUNITY): Payer: BC Managed Care – PPO | Admitting: Oncology

## 2012-07-07 ENCOUNTER — Inpatient Hospital Stay (HOSPITAL_COMMUNITY): Payer: BC Managed Care – PPO

## 2012-07-07 VITALS — BP 144/76 | HR 115 | Temp 97.5°F | Resp 20 | Wt 206.0 lb

## 2012-07-07 DIAGNOSIS — C8119 Nodular sclerosis classical Hodgkin lymphoma, extranodal and solid organ sites: Secondary | ICD-10-CM

## 2012-07-07 DIAGNOSIS — C8190 Hodgkin lymphoma, unspecified, unspecified site: Secondary | ICD-10-CM

## 2012-07-07 NOTE — Progress Notes (Signed)
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 He declined the use of Neulasta but we had a long discussion today about that and he will reconsider if his counts are not adequate with cycle 2. I emphasized to him the importance of keeping on schedule with a curative intent with this disease. He has only one small episode of nausea after cycle 2. He did get somewhat constipated after therapy and we talked about starting stool softeners in the form of Colace on the Monday evening before therapy on Wednesday. I've asked him to consider to Colace twice a day 2 days before and 2-3 days after  use of milk of magnesia. His itching that he had has gone away completely. He also has a littlle irritation around his anal area due to frequency of bowel movements and excessive wiping. He will use Desitin twice a day and possible for several days and also keep his bowels soft. BP 144/76  Pulse 115  Temp 97.5 F (36.4 C) (Oral)  Resp 20  Wt 206 lb (93.441 kg) He is in no acute distress. The left neck mass is totally gone. There is no adenopathy to feel in the cervical, subclavicular, infraclavicular, axillary, or inguinal areas. He has no hepatosplenomegaly. Heart shows a tachycardia only. Port-A-Cath is intact. Skin exam is unremarkable. Lungs are clear to auscultation and percussion. He has no arm or leg edema.  He is still working full-time which is excellent. We discussed taking the allopurinol through the end of cycle 2 and then he can stop that drug.  We will see him next week for cycle today but again we may have to initiate Neulasta. I've scheduled his PET scan for 09/03/2012

## 2012-07-07 NOTE — Patient Instructions (Addendum)
[facility]  Discharge Instructions  RECOMMENDATIONS MADE BY THE CONSULTANT AND ANY TEST RESULTS WILL BE SENT TO YOUR REFERRING DOCTOR.   EXAM FINDINGS BY MD TODAY AND SIGNS AND SYMPTOMS TO REPORT TO CLINIC OR PRIMARY MD: exam good  Curative therapy is 6 cycles of chemo then involved field radiation MEDICATIONS PRESCRIBED: desitin cream to anal area at night   INSTRUCTIONS GIVEN AND DISCUSSED: Pet scan on Jan 9th then to see Dr. Mariel Sleet after Pet  SPECIAL INSTRUCTIONS/FOLLOW-UP: See Elijah Birk our PA in 1 month.   I acknowledge that I have been informed and understand all the instructions given to me and received a copy. I do not have any more questions at this time, but understand that I may call the Specialty Clinic at [facility] at [phone] during business hours should I have any further questions or need assistance in obtaining follow-up care.    __________________________________________  _____________  __________ Signature of Patient or Authorized Representative            Date                   Time    __________________________________________ Nurse's Signature

## 2012-07-14 ENCOUNTER — Inpatient Hospital Stay (HOSPITAL_COMMUNITY): Payer: BC Managed Care – PPO

## 2012-07-15 ENCOUNTER — Encounter (HOSPITAL_BASED_OUTPATIENT_CLINIC_OR_DEPARTMENT_OTHER): Payer: BC Managed Care – PPO

## 2012-07-15 VITALS — Wt 205.0 lb

## 2012-07-15 DIAGNOSIS — C8119 Nodular sclerosis classical Hodgkin lymphoma, extranodal and solid organ sites: Secondary | ICD-10-CM

## 2012-07-15 DIAGNOSIS — C8190 Hodgkin lymphoma, unspecified, unspecified site: Secondary | ICD-10-CM

## 2012-07-15 DIAGNOSIS — D72819 Decreased white blood cell count, unspecified: Secondary | ICD-10-CM

## 2012-07-15 LAB — COMPREHENSIVE METABOLIC PANEL
AST: 21 U/L (ref 0–37)
Albumin: 3.4 g/dL — ABNORMAL LOW (ref 3.5–5.2)
BUN: 15 mg/dL (ref 6–23)
Creatinine, Ser: 0.84 mg/dL (ref 0.50–1.35)
Total Protein: 6.5 g/dL (ref 6.0–8.3)

## 2012-07-15 LAB — CBC WITH DIFFERENTIAL/PLATELET
Basophils Absolute: 0 10*3/uL (ref 0.0–0.1)
Basophils Relative: 1 % (ref 0–1)
Eosinophils Absolute: 0.1 10*3/uL (ref 0.0–0.7)
HCT: 37.8 % — ABNORMAL LOW (ref 39.0–52.0)
Hemoglobin: 12.6 g/dL — ABNORMAL LOW (ref 13.0–17.0)
MCH: 26.9 pg (ref 26.0–34.0)
MCHC: 33.3 g/dL (ref 30.0–36.0)
Monocytes Absolute: 0.5 10*3/uL (ref 0.1–1.0)
Monocytes Relative: 20 % — ABNORMAL HIGH (ref 3–12)
Neutro Abs: 0.3 10*3/uL — ABNORMAL LOW (ref 1.7–7.7)
Neutrophils Relative %: 10 % — ABNORMAL LOW (ref 43–77)
RDW: 16.3 % — ABNORMAL HIGH (ref 11.5–15.5)

## 2012-07-15 LAB — LACTATE DEHYDROGENASE: LDH: 155 U/L (ref 94–250)

## 2012-07-15 LAB — SEDIMENTATION RATE: Sed Rate: 10 mm/hr (ref 0–16)

## 2012-07-15 MED ORDER — SODIUM CHLORIDE 0.9 % IJ SOLN
10.0000 mL | INTRAMUSCULAR | Status: DC | PRN
  Administered 2012-07-15: 10 mL
  Filled 2012-07-15: qty 10

## 2012-07-15 MED ORDER — SODIUM CHLORIDE 0.9 % IJ SOLN
INTRAMUSCULAR | Status: AC
  Filled 2012-07-15: qty 20

## 2012-07-15 MED ORDER — HEPARIN SOD (PORK) LOCK FLUSH 100 UNIT/ML IV SOLN
500.0000 [IU] | Freq: Once | INTRAVENOUS | Status: AC
  Administered 2012-07-15: 500 [IU] via INTRAVENOUS
  Filled 2012-07-15: qty 5

## 2012-07-15 NOTE — Progress Notes (Signed)
Chemo held due to low white count. R/s for 1 week

## 2012-07-16 ENCOUNTER — Other Ambulatory Visit (HOSPITAL_COMMUNITY): Payer: Self-pay | Admitting: *Deleted

## 2012-07-16 ENCOUNTER — Ambulatory Visit (HOSPITAL_COMMUNITY): Payer: BC Managed Care – PPO

## 2012-07-17 ENCOUNTER — Inpatient Hospital Stay (HOSPITAL_COMMUNITY): Payer: BC Managed Care – PPO

## 2012-07-20 ENCOUNTER — Encounter (HOSPITAL_BASED_OUTPATIENT_CLINIC_OR_DEPARTMENT_OTHER): Payer: BC Managed Care – PPO

## 2012-07-20 VITALS — BP 127/73 | HR 73 | Temp 97.6°F | Resp 18 | Wt 209.6 lb

## 2012-07-20 DIAGNOSIS — Z5111 Encounter for antineoplastic chemotherapy: Secondary | ICD-10-CM

## 2012-07-20 DIAGNOSIS — C8119 Nodular sclerosis classical Hodgkin lymphoma, extranodal and solid organ sites: Secondary | ICD-10-CM

## 2012-07-20 DIAGNOSIS — C8190 Hodgkin lymphoma, unspecified, unspecified site: Secondary | ICD-10-CM

## 2012-07-20 LAB — CBC WITH DIFFERENTIAL/PLATELET
Basophils Absolute: 0 10*3/uL (ref 0.0–0.1)
HCT: 37.4 % — ABNORMAL LOW (ref 39.0–52.0)
Hemoglobin: 12.6 g/dL — ABNORMAL LOW (ref 13.0–17.0)
Lymphocytes Relative: 55 % — ABNORMAL HIGH (ref 12–46)
Lymphs Abs: 2.3 10*3/uL (ref 0.7–4.0)
MCV: 80.4 fL (ref 78.0–100.0)
Monocytes Absolute: 0.7 10*3/uL (ref 0.1–1.0)
Monocytes Relative: 16 % — ABNORMAL HIGH (ref 3–12)
Neutro Abs: 1.1 10*3/uL — ABNORMAL LOW (ref 1.7–7.7)
RBC: 4.65 MIL/uL (ref 4.22–5.81)
WBC: 4.3 10*3/uL (ref 4.0–10.5)

## 2012-07-20 MED ORDER — SODIUM CHLORIDE 0.9 % IV SOLN
Freq: Once | INTRAVENOUS | Status: AC
  Administered 2012-07-20: 11:00:00 via INTRAVENOUS

## 2012-07-20 MED ORDER — SODIUM CHLORIDE 0.9 % IV SOLN
375.0000 mg/m2 | Freq: Once | INTRAVENOUS | Status: AC
  Administered 2012-07-20: 760 mg via INTRAVENOUS
  Filled 2012-07-20: qty 38

## 2012-07-20 MED ORDER — BLEOMYCIN SULFATE CHEMO INJECTION 30 UNIT
10.0000 [IU]/m2 | Freq: Once | INTRAMUSCULAR | Status: AC
  Administered 2012-07-20: 20 [IU] via INTRAVENOUS
  Filled 2012-07-20: qty 6.7

## 2012-07-20 MED ORDER — VINBLASTINE SULFATE CHEMO INJECTION 1 MG/ML
6.0000 mg/m2 | Freq: Once | INTRAVENOUS | Status: AC
  Administered 2012-07-20: 12.2 mg via INTRAVENOUS
  Filled 2012-07-20: qty 12.2

## 2012-07-20 MED ORDER — SODIUM CHLORIDE 0.9 % IV SOLN
Freq: Once | INTRAVENOUS | Status: AC
  Administered 2012-07-20: 16 mg via INTRAVENOUS
  Filled 2012-07-20: qty 8

## 2012-07-20 MED ORDER — VINBLASTINE SULFATE CHEMO INJECTION 1 MG/ML: 6.0000 mg/m2 | Freq: Once | INTRAVENOUS | Status: DC

## 2012-07-20 MED ORDER — HEPARIN SOD (PORK) LOCK FLUSH 100 UNIT/ML IV SOLN
INTRAVENOUS | Status: AC
  Filled 2012-07-20: qty 5

## 2012-07-20 MED ORDER — HEPARIN SOD (PORK) LOCK FLUSH 100 UNIT/ML IV SOLN
500.0000 [IU] | Freq: Once | INTRAVENOUS | Status: AC | PRN
  Administered 2012-07-20: 500 [IU]
  Filled 2012-07-20: qty 5

## 2012-07-20 MED ORDER — SODIUM CHLORIDE 0.9 % IJ SOLN
INTRAMUSCULAR | Status: AC
  Filled 2012-07-20: qty 10

## 2012-07-20 MED ORDER — DOXORUBICIN HCL CHEMO IV INJECTION 2 MG/ML
25.0000 mg/m2 | Freq: Once | INTRAVENOUS | Status: AC
  Administered 2012-07-20: 52 mg via INTRAVENOUS
  Filled 2012-07-20: qty 26

## 2012-07-21 ENCOUNTER — Encounter (HOSPITAL_BASED_OUTPATIENT_CLINIC_OR_DEPARTMENT_OTHER): Payer: BC Managed Care – PPO

## 2012-07-21 ENCOUNTER — Inpatient Hospital Stay (HOSPITAL_COMMUNITY): Payer: BC Managed Care – PPO

## 2012-07-21 VITALS — BP 127/70 | HR 98 | Temp 98.2°F | Resp 16

## 2012-07-21 DIAGNOSIS — C8119 Nodular sclerosis classical Hodgkin lymphoma, extranodal and solid organ sites: Secondary | ICD-10-CM

## 2012-07-21 DIAGNOSIS — C8190 Hodgkin lymphoma, unspecified, unspecified site: Secondary | ICD-10-CM

## 2012-07-21 MED ORDER — PEGFILGRASTIM INJECTION 6 MG/0.6ML
6.0000 mg | Freq: Once | SUBCUTANEOUS | Status: AC
  Administered 2012-07-21: 6 mg via SUBCUTANEOUS

## 2012-07-21 MED ORDER — PEGFILGRASTIM INJECTION 6 MG/0.6ML
SUBCUTANEOUS | Status: AC
  Filled 2012-07-21: qty 0.6

## 2012-07-21 NOTE — Progress Notes (Signed)
Samuel [name] presents today for injection per MD orders. Neulasta 6mg administered SQ in right Abdomen. Administration without incident. Patient tolerated well.  

## 2012-07-24 ENCOUNTER — Other Ambulatory Visit (HOSPITAL_COMMUNITY): Payer: Self-pay | Admitting: Oncology

## 2012-07-28 ENCOUNTER — Inpatient Hospital Stay (HOSPITAL_COMMUNITY): Payer: BC Managed Care – PPO

## 2012-07-29 ENCOUNTER — Inpatient Hospital Stay (HOSPITAL_COMMUNITY): Payer: BC Managed Care – PPO

## 2012-07-30 ENCOUNTER — Ambulatory Visit (HOSPITAL_COMMUNITY): Payer: BC Managed Care – PPO

## 2012-07-31 ENCOUNTER — Inpatient Hospital Stay (HOSPITAL_COMMUNITY): Payer: BC Managed Care – PPO

## 2012-07-31 ENCOUNTER — Ambulatory Visit (HOSPITAL_COMMUNITY): Payer: BC Managed Care – PPO | Admitting: Oncology

## 2012-07-31 ENCOUNTER — Other Ambulatory Visit (HOSPITAL_COMMUNITY): Payer: Self-pay | Admitting: Oncology

## 2012-08-03 ENCOUNTER — Encounter (HOSPITAL_COMMUNITY): Payer: BC Managed Care – PPO | Attending: Oncology

## 2012-08-03 VITALS — BP 131/75 | HR 78 | Temp 97.6°F | Resp 18 | Wt 214.4 lb

## 2012-08-03 DIAGNOSIS — C8190 Hodgkin lymphoma, unspecified, unspecified site: Secondary | ICD-10-CM | POA: Insufficient documentation

## 2012-08-03 DIAGNOSIS — C8119 Nodular sclerosis classical Hodgkin lymphoma, extranodal and solid organ sites: Secondary | ICD-10-CM

## 2012-08-03 DIAGNOSIS — Z5111 Encounter for antineoplastic chemotherapy: Secondary | ICD-10-CM

## 2012-08-03 LAB — CBC WITH DIFFERENTIAL/PLATELET
Eosinophils Relative: 1 % (ref 0–5)
HCT: 37 % — ABNORMAL LOW (ref 39.0–52.0)
Hemoglobin: 12.4 g/dL — ABNORMAL LOW (ref 13.0–17.0)
Lymphocytes Relative: 27 % (ref 12–46)
Lymphs Abs: 2.1 10*3/uL (ref 0.7–4.0)
MCV: 81.3 fL (ref 78.0–100.0)
Monocytes Absolute: 0.7 10*3/uL (ref 0.1–1.0)
Neutro Abs: 4.8 10*3/uL (ref 1.7–7.7)
RBC: 4.55 MIL/uL (ref 4.22–5.81)
WBC: 7.7 10*3/uL (ref 4.0–10.5)

## 2012-08-03 LAB — COMPREHENSIVE METABOLIC PANEL
ALT: 30 U/L (ref 0–53)
AST: 20 U/L (ref 0–37)
CO2: 25 mEq/L (ref 19–32)
Calcium: 9.1 mg/dL (ref 8.4–10.5)
Chloride: 103 mEq/L (ref 96–112)
Creatinine, Ser: 0.92 mg/dL (ref 0.50–1.35)
GFR calc Af Amer: >90 mL/min (ref >90)
GFR calc non Af Amer: >90 mL/min (ref >90)
Glucose, Bld: 134 mg/dL — ABNORMAL HIGH (ref 70–99)
Total Bilirubin: 0.3 mg/dL (ref 0.3–1.2)

## 2012-08-03 LAB — SEDIMENTATION RATE: Sed Rate: 20 mm/hr — ABNORMAL HIGH (ref 0–16)

## 2012-08-03 MED ORDER — VINBLASTINE SULFATE CHEMO INJECTION 1 MG/ML: 6.0000 mg/m2 | Freq: Once | INTRAVENOUS | Status: DC

## 2012-08-03 MED ORDER — HEPARIN SOD (PORK) LOCK FLUSH 100 UNIT/ML IV SOLN
INTRAVENOUS | Status: AC
  Filled 2012-08-03: qty 5

## 2012-08-03 MED ORDER — VINBLASTINE SULFATE CHEMO INJECTION 1 MG/ML
6.0000 mg/m2 | Freq: Once | INTRAVENOUS | Status: AC
  Administered 2012-08-03: 12.2 mg via INTRAVENOUS
  Filled 2012-08-03: qty 12.2

## 2012-08-03 MED ORDER — SODIUM CHLORIDE 0.9 % IJ SOLN
INTRAMUSCULAR | Status: AC
  Filled 2012-08-03: qty 10

## 2012-08-03 MED ORDER — SODIUM CHLORIDE 0.9 % IV SOLN
Freq: Once | INTRAVENOUS | Status: AC
  Administered 2012-08-03: 16 mg via INTRAVENOUS
  Filled 2012-08-03: qty 8

## 2012-08-03 MED ORDER — SODIUM CHLORIDE 0.9 % IV SOLN
10.0000 [IU]/m2 | Freq: Once | INTRAVENOUS | Status: AC
  Administered 2012-08-03: 20 [IU] via INTRAVENOUS
  Filled 2012-08-03: qty 6.7

## 2012-08-03 MED ORDER — SODIUM CHLORIDE 0.9 % IV SOLN
Freq: Once | INTRAVENOUS | Status: AC
  Administered 2012-08-03: 10:00:00 via INTRAVENOUS

## 2012-08-03 MED ORDER — HEPARIN SOD (PORK) LOCK FLUSH 100 UNIT/ML IV SOLN
500.0000 [IU] | Freq: Once | INTRAVENOUS | Status: AC | PRN
  Administered 2012-08-03: 500 [IU]
  Filled 2012-08-03: qty 5

## 2012-08-03 MED ORDER — DOXORUBICIN HCL CHEMO IV INJECTION 2 MG/ML
25.0000 mg/m2 | Freq: Once | INTRAVENOUS | Status: AC
  Administered 2012-08-03: 52 mg via INTRAVENOUS
  Filled 2012-08-03: qty 26

## 2012-08-03 MED ORDER — SODIUM CHLORIDE 0.9 % IV SOLN
375.0000 mg/m2 | Freq: Once | INTRAVENOUS | Status: AC
  Administered 2012-08-03: 760 mg via INTRAVENOUS
  Filled 2012-08-03: qty 38

## 2012-08-04 ENCOUNTER — Encounter (HOSPITAL_BASED_OUTPATIENT_CLINIC_OR_DEPARTMENT_OTHER): Payer: BC Managed Care – PPO

## 2012-08-04 ENCOUNTER — Ambulatory Visit (HOSPITAL_COMMUNITY): Payer: BC Managed Care – PPO | Admitting: Oncology

## 2012-08-04 ENCOUNTER — Inpatient Hospital Stay (HOSPITAL_COMMUNITY): Payer: BC Managed Care – PPO

## 2012-08-04 VITALS — BP 126/64 | HR 78 | Temp 97.4°F | Resp 18

## 2012-08-04 DIAGNOSIS — Z5189 Encounter for other specified aftercare: Secondary | ICD-10-CM

## 2012-08-04 DIAGNOSIS — C8190 Hodgkin lymphoma, unspecified, unspecified site: Secondary | ICD-10-CM

## 2012-08-04 DIAGNOSIS — C8119 Nodular sclerosis classical Hodgkin lymphoma, extranodal and solid organ sites: Secondary | ICD-10-CM

## 2012-08-04 MED ORDER — PEGFILGRASTIM INJECTION 6 MG/0.6ML
SUBCUTANEOUS | Status: AC
  Filled 2012-08-04: qty 0.6

## 2012-08-04 MED ORDER — PEGFILGRASTIM INJECTION 6 MG/0.6ML
6.0000 mg | Freq: Once | SUBCUTANEOUS | Status: AC
  Administered 2012-08-04: 6 mg via SUBCUTANEOUS

## 2012-08-04 NOTE — Progress Notes (Signed)
Samuel [name] presents today for injection per MD orders. Neulasta 6mg administered SQ in right Abdomen. Administration without incident. Patient tolerated well.  

## 2012-08-11 ENCOUNTER — Inpatient Hospital Stay (HOSPITAL_COMMUNITY): Payer: BC Managed Care – PPO

## 2012-08-11 ENCOUNTER — Ambulatory Visit (HOSPITAL_COMMUNITY): Payer: BC Managed Care – PPO | Admitting: Oncology

## 2012-08-12 ENCOUNTER — Ambulatory Visit (HOSPITAL_COMMUNITY): Payer: BC Managed Care – PPO | Admitting: Oncology

## 2012-08-12 ENCOUNTER — Inpatient Hospital Stay (HOSPITAL_COMMUNITY): Payer: BC Managed Care – PPO

## 2012-08-14 ENCOUNTER — Ambulatory Visit (HOSPITAL_COMMUNITY): Payer: BC Managed Care – PPO | Admitting: Oncology

## 2012-08-14 ENCOUNTER — Inpatient Hospital Stay (HOSPITAL_COMMUNITY): Payer: BC Managed Care – PPO

## 2012-08-17 ENCOUNTER — Ambulatory Visit (HOSPITAL_COMMUNITY): Payer: BC Managed Care – PPO | Admitting: Oncology

## 2012-08-17 ENCOUNTER — Encounter (HOSPITAL_BASED_OUTPATIENT_CLINIC_OR_DEPARTMENT_OTHER): Payer: BC Managed Care – PPO

## 2012-08-17 ENCOUNTER — Inpatient Hospital Stay (HOSPITAL_COMMUNITY): Payer: BC Managed Care – PPO

## 2012-08-17 VITALS — BP 125/77 | HR 89 | Temp 97.7°F | Resp 16 | Ht 68.5 in | Wt 215.6 lb

## 2012-08-17 DIAGNOSIS — C8119 Nodular sclerosis classical Hodgkin lymphoma, extranodal and solid organ sites: Secondary | ICD-10-CM

## 2012-08-17 DIAGNOSIS — C8190 Hodgkin lymphoma, unspecified, unspecified site: Secondary | ICD-10-CM

## 2012-08-17 DIAGNOSIS — Z5111 Encounter for antineoplastic chemotherapy: Secondary | ICD-10-CM

## 2012-08-17 LAB — COMPREHENSIVE METABOLIC PANEL
ALT: 34 U/L (ref 0–53)
AST: 22 U/L (ref 0–37)
Albumin: 3.6 g/dL (ref 3.5–5.2)
CO2: 27 mEq/L (ref 19–32)
Calcium: 9 mg/dL (ref 8.4–10.5)
GFR calc non Af Amer: >90 mL/min (ref >90)
Sodium: 137 mEq/L (ref 135–145)

## 2012-08-17 LAB — CBC WITH DIFFERENTIAL/PLATELET
Basophils Absolute: 0.1 10*3/uL (ref 0.0–0.1)
Eosinophils Relative: 1 % (ref 0–5)
Lymphocytes Relative: 21 % (ref 12–46)
Lymphs Abs: 1.8 10*3/uL (ref 0.7–4.0)
Neutro Abs: 5.9 10*3/uL (ref 1.7–7.7)
Neutrophils Relative %: 71 % (ref 43–77)
Platelets: 120 10*3/uL — ABNORMAL LOW (ref 150–400)
RBC: 4.56 MIL/uL (ref 4.22–5.81)
RDW: 18.9 % — ABNORMAL HIGH (ref 11.5–15.5)
WBC: 8.3 10*3/uL (ref 4.0–10.5)

## 2012-08-17 MED ORDER — SODIUM CHLORIDE 0.9 % IV SOLN
Freq: Once | INTRAVENOUS | Status: AC
  Administered 2012-08-17: 16 mg via INTRAVENOUS
  Filled 2012-08-17: qty 8

## 2012-08-17 MED ORDER — VINBLASTINE SULFATE CHEMO INJECTION 1 MG/ML
6.0000 mg/m2 | Freq: Once | INTRAVENOUS | Status: AC
  Administered 2012-08-17: 13 mg via INTRAVENOUS
  Filled 2012-08-17: qty 13

## 2012-08-17 MED ORDER — HEPARIN SOD (PORK) LOCK FLUSH 100 UNIT/ML IV SOLN
INTRAVENOUS | Status: AC
  Filled 2012-08-17: qty 5

## 2012-08-17 MED ORDER — SODIUM CHLORIDE 0.9 % IV SOLN
Freq: Once | INTRAVENOUS | Status: AC
  Administered 2012-08-17: 12:00:00 via INTRAVENOUS

## 2012-08-17 MED ORDER — SODIUM CHLORIDE 0.9 % IJ SOLN
10.0000 mL | INTRAMUSCULAR | Status: DC | PRN
  Administered 2012-08-17: 10 mL
  Filled 2012-08-17: qty 10

## 2012-08-17 MED ORDER — VINBLASTINE SULFATE CHEMO INJECTION 1 MG/ML: 6.0000 mg/m2 | Freq: Once | INTRAVENOUS | Status: DC

## 2012-08-17 MED ORDER — HEPARIN SOD (PORK) LOCK FLUSH 100 UNIT/ML IV SOLN
500.0000 [IU] | Freq: Once | INTRAVENOUS | Status: AC | PRN
  Administered 2012-08-17: 500 [IU]
  Filled 2012-08-17: qty 5

## 2012-08-17 MED ORDER — SODIUM CHLORIDE 0.9 % IV SOLN
375.0000 mg/m2 | Freq: Once | INTRAVENOUS | Status: AC
  Administered 2012-08-17: 820 mg via INTRAVENOUS
  Filled 2012-08-17: qty 41

## 2012-08-17 MED ORDER — DOXORUBICIN HCL CHEMO IV INJECTION 2 MG/ML: 25.0000 mg/m2 | Freq: Once | INTRAVENOUS | Status: DC

## 2012-08-17 MED ORDER — SODIUM CHLORIDE 0.9 % IJ SOLN
INTRAMUSCULAR | Status: AC
  Filled 2012-08-17: qty 10

## 2012-08-17 MED ORDER — SODIUM CHLORIDE 0.9 % IV SOLN
10.0000 [IU]/m2 | Freq: Once | INTRAVENOUS | Status: AC
  Administered 2012-08-17: 22 [IU] via INTRAVENOUS
  Filled 2012-08-17: qty 7.3

## 2012-08-17 MED ORDER — DOXORUBICIN HCL CHEMO IV INJECTION 2 MG/ML
25.0000 mg/m2 | Freq: Once | INTRAVENOUS | Status: AC
  Administered 2012-08-17: 54 mg via INTRAVENOUS
  Filled 2012-08-17: qty 27

## 2012-08-18 ENCOUNTER — Encounter (HOSPITAL_BASED_OUTPATIENT_CLINIC_OR_DEPARTMENT_OTHER): Payer: BC Managed Care – PPO

## 2012-08-18 DIAGNOSIS — C8190 Hodgkin lymphoma, unspecified, unspecified site: Secondary | ICD-10-CM

## 2012-08-18 DIAGNOSIS — C8119 Nodular sclerosis classical Hodgkin lymphoma, extranodal and solid organ sites: Secondary | ICD-10-CM

## 2012-08-18 DIAGNOSIS — Z5189 Encounter for other specified aftercare: Secondary | ICD-10-CM

## 2012-08-18 MED ORDER — PEGFILGRASTIM INJECTION 6 MG/0.6ML
6.0000 mg | Freq: Once | SUBCUTANEOUS | Status: AC
  Administered 2012-08-18: 6 mg via SUBCUTANEOUS

## 2012-08-18 MED ORDER — PEGFILGRASTIM INJECTION 6 MG/0.6ML
SUBCUTANEOUS | Status: AC
  Filled 2012-08-18: qty 0.6

## 2012-08-18 NOTE — Progress Notes (Signed)
Samuel [name] presents today for injection per MD orders. Neulasta 6mg administered SQ in right Abdomen. Administration without incident. Patient tolerated well.  

## 2012-08-21 ENCOUNTER — Other Ambulatory Visit (HOSPITAL_COMMUNITY): Payer: BC Managed Care – PPO

## 2012-08-25 ENCOUNTER — Inpatient Hospital Stay (HOSPITAL_COMMUNITY): Payer: BC Managed Care – PPO

## 2012-08-27 ENCOUNTER — Inpatient Hospital Stay (HOSPITAL_COMMUNITY): Payer: BC Managed Care – PPO

## 2012-08-28 ENCOUNTER — Ambulatory Visit (HOSPITAL_COMMUNITY): Payer: BC Managed Care – PPO

## 2012-08-31 ENCOUNTER — Encounter (HOSPITAL_BASED_OUTPATIENT_CLINIC_OR_DEPARTMENT_OTHER): Payer: PRIVATE HEALTH INSURANCE | Admitting: Oncology

## 2012-08-31 ENCOUNTER — Encounter (HOSPITAL_COMMUNITY): Payer: PRIVATE HEALTH INSURANCE | Attending: Oncology

## 2012-08-31 ENCOUNTER — Encounter (HOSPITAL_COMMUNITY): Payer: Self-pay | Admitting: Oncology

## 2012-08-31 VITALS — BP 134/78 | HR 93 | Temp 97.8°F | Resp 16 | Wt 216.8 lb

## 2012-08-31 DIAGNOSIS — D696 Thrombocytopenia, unspecified: Secondary | ICD-10-CM

## 2012-08-31 DIAGNOSIS — C8119 Nodular sclerosis classical Hodgkin lymphoma, extranodal and solid organ sites: Secondary | ICD-10-CM

## 2012-08-31 DIAGNOSIS — Z5111 Encounter for antineoplastic chemotherapy: Secondary | ICD-10-CM

## 2012-08-31 DIAGNOSIS — C8190 Hodgkin lymphoma, unspecified, unspecified site: Secondary | ICD-10-CM | POA: Insufficient documentation

## 2012-08-31 LAB — COMPREHENSIVE METABOLIC PANEL
Alkaline Phosphatase: 76 U/L (ref 39–117)
BUN: 11 mg/dL (ref 6–23)
CO2: 29 mEq/L (ref 19–32)
Chloride: 102 mEq/L (ref 96–112)
Creatinine, Ser: 1.04 mg/dL (ref 0.50–1.35)
GFR calc non Af Amer: >90 mL/min (ref >90)
Glucose, Bld: 135 mg/dL — ABNORMAL HIGH (ref 70–99)
Potassium: 3.5 mEq/L (ref 3.5–5.1)
Total Bilirubin: 0.4 mg/dL (ref 0.3–1.2)

## 2012-08-31 LAB — CBC WITH DIFFERENTIAL/PLATELET
Basophils Absolute: 0.1 10*3/uL (ref 0.0–0.1)
Eosinophils Relative: 1 % (ref 0–5)
Lymphocytes Relative: 23 % (ref 12–46)
Lymphs Abs: 1.7 10*3/uL (ref 0.7–4.0)
MCV: 82.2 fL (ref 78.0–100.0)
Monocytes Absolute: 0.9 10*3/uL (ref 0.1–1.0)
Monocytes Relative: 11 % (ref 3–12)
Neutro Abs: 4.8 10*3/uL (ref 1.7–7.7)
RBC: 4.33 MIL/uL (ref 4.22–5.81)
RDW: 19.4 % — ABNORMAL HIGH (ref 11.5–15.5)

## 2012-08-31 MED ORDER — SODIUM CHLORIDE 0.9 % IV SOLN
Freq: Once | INTRAVENOUS | Status: AC
  Administered 2012-08-31: 16 mg via INTRAVENOUS
  Filled 2012-08-31: qty 8

## 2012-08-31 MED ORDER — SODIUM CHLORIDE 0.9 % IV SOLN
10.0000 [IU]/m2 | Freq: Once | INTRAVENOUS | Status: AC
  Administered 2012-08-31: 22 [IU] via INTRAVENOUS
  Filled 2012-08-31: qty 7.3

## 2012-08-31 MED ORDER — SODIUM CHLORIDE 0.9 % IV SOLN
375.0000 mg/m2 | Freq: Once | INTRAVENOUS | Status: AC
  Administered 2012-08-31: 820 mg via INTRAVENOUS
  Filled 2012-08-31: qty 41

## 2012-08-31 MED ORDER — VINBLASTINE SULFATE CHEMO INJECTION 1 MG/ML
6.0000 mg/m2 | Freq: Once | INTRAVENOUS | Status: AC
  Administered 2012-08-31: 13 mg via INTRAVENOUS
  Filled 2012-08-31: qty 13

## 2012-08-31 MED ORDER — HEPARIN SOD (PORK) LOCK FLUSH 100 UNIT/ML IV SOLN
500.0000 [IU] | Freq: Once | INTRAVENOUS | Status: AC | PRN
  Administered 2012-08-31: 500 [IU]
  Filled 2012-08-31: qty 5

## 2012-08-31 MED ORDER — SODIUM CHLORIDE 0.9 % IV SOLN
Freq: Once | INTRAVENOUS | Status: AC
  Administered 2012-08-31: 12:00:00 via INTRAVENOUS

## 2012-08-31 MED ORDER — HEPARIN SOD (PORK) LOCK FLUSH 100 UNIT/ML IV SOLN
INTRAVENOUS | Status: AC
  Filled 2012-08-31: qty 5

## 2012-08-31 MED ORDER — VINBLASTINE SULFATE CHEMO INJECTION 1 MG/ML: 6.0000 mg/m2 | Freq: Once | INTRAVENOUS | Status: DC

## 2012-08-31 MED ORDER — DOXORUBICIN HCL CHEMO IV INJECTION 2 MG/ML
25.0000 mg/m2 | Freq: Once | INTRAVENOUS | Status: AC
  Administered 2012-08-31: 54 mg via INTRAVENOUS
  Filled 2012-08-31: qty 27

## 2012-08-31 NOTE — Patient Instructions (Signed)
.  [facility] Discharge Instructions  RECOMMENDATIONS MADE BY THE CONSULTANT AND ANY TEST RESULTS WILL BE SENT TO YOUR REFERRING PHYSICIAN.  EXAM FINDINGS BY THE PHYSICIAN TODAY AND SIGNS OR SYMPTOMS TO REPORT TO CLINIC OR PRIMARY PHYSICIAN:  Exam good today Cut back on your sweets   SPECIAL INSTRUCTIONS/FOLLOW-UP: Scans and tests as scheduled  Thank you for choosing [facility] to provide your oncology and hematology care.  To afford each patient quality time with our providers, please arrive at least 15 minutes before your scheduled appointment time.  With your help, our goal is to use those 15 minutes to complete the necessary work-up to ensure our physicians have the information they need to help with your evaluation and healthcare recommendations.    Effective January 1st, 2014, we ask that you re-schedule your appointment with our physicians should you arrive 10 or more minutes late for your appointment.  We strive to give you quality time with our providers, and arriving late affects you and other patients whose appointments are after yours.    Again, thank you for choosing [facility].  Our hope is that these requests will decrease the amount of time that you wait before being seen by our physicians.       _____________________________________________________________  I acknowledge that I have been informed and understand all the instructions given to me and received a copy. I do not have anymore questions at this time but understand that I may call the Cancer Center at [facility] at [phone] during business hours should I have any further questions or need assistance in obtaining follow-up care.

## 2012-08-31 NOTE — Progress Notes (Signed)
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 He initially declined Neulasta but has agreed to it more recently. He is also had weight gain and his doses have been adjusted upward because of the weight. He is weaker and more tired but is still working full-time. He does not have side effects from the Neulasta. He remains mildly thrombocytopenia which is not a surprise. He has no B. symptoms. He does have a chest cold potentially and we are going to try to look at his sputum. He has no fever no chills but is 1 child has been ill with a cold. He has more the sniffles than anything else. He has no chills no fevers etc. No night sweats. Vital signs otherwise are good. Weight is 216 pounds now. Lymph nodes are negative in the cervical, supraclavicular, infraclavicular, axillary, and inguinal areas. His lungs are clear to auscultation and percussion throughout. Heart shows a regular rhythm and rate without murmur rub or gallop. Port in the left upper chest walls intact.  abdomen is clearly larger than it was when we met but there is no hepatosplenomegaly. Bowel sounds are normal. He has no arm or leg edema. He does not look acutely ill. He is not actively coughing etc.  We will treat him today and his PET scan is due next week and PFTs are due next week and an echo is due soon

## 2012-09-01 ENCOUNTER — Other Ambulatory Visit (HOSPITAL_COMMUNITY): Payer: BC Managed Care – PPO

## 2012-09-01 ENCOUNTER — Encounter (HOSPITAL_BASED_OUTPATIENT_CLINIC_OR_DEPARTMENT_OTHER): Payer: PRIVATE HEALTH INSURANCE

## 2012-09-01 ENCOUNTER — Ambulatory Visit (HOSPITAL_COMMUNITY)
Admission: RE | Admit: 2012-09-01 | Discharge: 2012-09-01 | Disposition: A | Discharge status: Home / Self Care | Payer: PRIVATE HEALTH INSURANCE | Source: Ambulatory Visit | Attending: Oncology | Admitting: Oncology

## 2012-09-01 VITALS — BP 118/70 | HR 76 | Temp 98.2°F | Resp 18

## 2012-09-01 DIAGNOSIS — Z5189 Encounter for other specified aftercare: Secondary | ICD-10-CM

## 2012-09-01 DIAGNOSIS — I369 Nonrheumatic tricuspid valve disorder, unspecified: Secondary | ICD-10-CM

## 2012-09-01 DIAGNOSIS — C8190 Hodgkin lymphoma, unspecified, unspecified site: Secondary | ICD-10-CM

## 2012-09-01 DIAGNOSIS — Z9221 Personal history of antineoplastic chemotherapy: Secondary | ICD-10-CM | POA: Insufficient documentation

## 2012-09-01 DIAGNOSIS — C8119 Nodular sclerosis classical Hodgkin lymphoma, extranodal and solid organ sites: Secondary | ICD-10-CM

## 2012-09-01 MED ORDER — PEGFILGRASTIM INJECTION 6 MG/0.6ML
6.0000 mg | Freq: Once | SUBCUTANEOUS | Status: AC
  Administered 2012-09-01: 6 mg via SUBCUTANEOUS

## 2012-09-01 MED ORDER — PEGFILGRASTIM INJECTION 6 MG/0.6ML
SUBCUTANEOUS | Status: AC
  Filled 2012-09-01: qty 0.6

## 2012-09-01 NOTE — Progress Notes (Signed)
Samuel [name] presents today for injection per MD orders. Neulasta 6mg  administered SQ in right Abdomen. Administration without incident. Patient tolerated well.

## 2012-09-01 NOTE — Progress Notes (Signed)
*  PRELIMINARY RESULTS* Echocardiogram 2D Echocardiogram has been performed.  Conrad Carmichaels 09/01/2012, 1:23 PM

## 2012-09-03 ENCOUNTER — Other Ambulatory Visit (HOSPITAL_COMMUNITY): Payer: BC Managed Care – PPO

## 2012-09-04 ENCOUNTER — Ambulatory Visit (HOSPITAL_COMMUNITY): Payer: BC Managed Care – PPO | Admitting: Oncology

## 2012-09-07 ENCOUNTER — Telehealth (HOSPITAL_COMMUNITY): Payer: Self-pay | Admitting: Oncology

## 2012-09-07 ENCOUNTER — Ambulatory Visit (HOSPITAL_COMMUNITY)
Admission: RE | Admit: 2012-09-07 | Discharge: 2012-09-07 | Disposition: A | Discharge status: Home / Self Care | Payer: PRIVATE HEALTH INSURANCE | Source: Ambulatory Visit | Attending: Oncology | Admitting: Oncology

## 2012-09-07 DIAGNOSIS — C8190 Hodgkin lymphoma, unspecified, unspecified site: Secondary | ICD-10-CM | POA: Insufficient documentation

## 2012-09-07 NOTE — Telephone Encounter (Signed)
[employer] [phone] ? IF CPT [id number] REQUIRE AUTH PER LINDA NO AUTH IS REQUIRED BASED ON MN 80/20 DED 1000 OOP 4000 DED INCLUDED LTM UNLIMITED EFFT 08/26/12  PET REQUIRES AUTH FAXED OFF NOTES TO Narda Amber [phone] EXT [phone]

## 2012-09-10 ENCOUNTER — Ambulatory Visit (HOSPITAL_COMMUNITY)
Admission: RE | Admit: 2012-09-10 | Discharge: 2012-09-10 | Disposition: A | Discharge status: Home / Self Care | Payer: PRIVATE HEALTH INSURANCE | Source: Ambulatory Visit | Attending: Oncology | Admitting: Oncology

## 2012-09-10 ENCOUNTER — Encounter (HOSPITAL_COMMUNITY): Payer: Self-pay

## 2012-09-10 DIAGNOSIS — Z9221 Personal history of antineoplastic chemotherapy: Secondary | ICD-10-CM | POA: Insufficient documentation

## 2012-09-10 DIAGNOSIS — C8190 Hodgkin lymphoma, unspecified, unspecified site: Secondary | ICD-10-CM | POA: Insufficient documentation

## 2012-09-10 HISTORY — DX: Hodgkin lymphoma, unspecified, unspecified site: C81.90

## 2012-09-10 MED ORDER — FLUDEOXYGLUCOSE F - 18 (FDG) INJECTION
15.5000 | Freq: Once | INTRAVENOUS | Status: AC | PRN
  Administered 2012-09-10: 15.5 via INTRAVENOUS

## 2012-09-11 ENCOUNTER — Encounter (HOSPITAL_COMMUNITY): Payer: Self-pay | Admitting: Oncology

## 2012-09-11 ENCOUNTER — Encounter (HOSPITAL_BASED_OUTPATIENT_CLINIC_OR_DEPARTMENT_OTHER): Payer: PRIVATE HEALTH INSURANCE | Admitting: Oncology

## 2012-09-11 VITALS — BP 121/73 | HR 70 | Temp 98.5°F | Resp 18 | Wt 212.0 lb

## 2012-09-11 DIAGNOSIS — C8190 Hodgkin lymphoma, unspecified, unspecified site: Secondary | ICD-10-CM

## 2012-09-11 DIAGNOSIS — C8119 Nodular sclerosis classical Hodgkin lymphoma, extranodal and solid organ sites: Secondary | ICD-10-CM

## 2012-09-11 NOTE — Patient Instructions (Signed)
[facility] Discharge Instructions  RECOMMENDATIONS MADE BY THE CONSULTANT AND ANY TEST RESULTS WILL BE SENT TO YOUR REFERRING PHYSICIAN.  EXAM FINDINGS BY THE PHYSICIAN TODAY AND SIGNS OR SYMPTOMS TO REPORT TO CLINIC OR PRIMARY PHYSICIAN: Exam and discussion by MD.  Your scan showed you have a complete remission. We will give you 6 cycles of chemotherapy. After cycle 5 we will repeat your pulmonary function tests and 2 D Echocardiogram.  We will also repeat your PET scan after cycle 6.  MEDICATIONS PRESCRIBED:  none  INSTRUCTIONS GIVEN AND DISCUSSED: Report fevers, chills, increased shortness of breath, or other problems  SPECIAL INSTRUCTIONS/FOLLOW-UP: Return in 1 month to see PA.  Thank you for choosing [name] to provide your oncology and hematology care.  To afford each patient quality time with our providers, please arrive at least 15 minutes before your scheduled appointment time.  With your help, our goal is to use those 15 minutes to complete the necessary work-up to ensure our physicians have the information they need to help with your evaluation and healthcare recommendations.    Effective January 1st, 2014, we ask that you re-schedule your appointment with our physicians should you arrive 10 or more minutes late for your appointment.  We strive to give you quality time with our providers, and arriving late affects you and other patients whose appointments are after yours.    Again, thank you for choosing [facility].  Our hope is that these requests will decrease the amount of time that you wait before being seen by our physicians.       _____________________________________________________________  Should you have questions after your visit to [facility], please contact our office at [phone] between the hours of 8:30 a.m. and 5:00 p.m.  Voicemails left after 4:30 p.m. will not be returned until the following  business day.  For prescription refill requests, have your pharmacy contact our office with your prescription refill request.

## 2012-09-11 NOTE — Progress Notes (Signed)
Problem #1 stage II A., classic nodular sclerosing Hodgkin's disease now status post 3 full cycles of ABVD with a one-week delay after cycle 1 and a due to low blood counts. He finally agreed to [facility] support. He has been on schedule since then. He is here today with his wife to go over the results of his PET scan. He is now in a complete response. We will therefore move on with 3 more cycles of ABVD to give him 6 full cycles of therapy and then he will move on to radiation therapy to involved fields where he had these bulky mediastinal lymph nodes.  He looks great today. His 2-D echo was fine at this point function studies were fine. He is not short of breath but he is somewhat tired. He has no B. symptomatology. He has no cervical, supraclavicular, or infraclavicular lymph nodes. His lungs remain perfectly auscultation and percussion. He looks weak but is in no acute distress.  We will continue therapy but the good news is that he is in complete remission.

## 2012-09-11 NOTE — Procedures (Signed)
NAME:  Samuel [name], [name]                 ACCOUNT NO.:  [id number]  MEDICAL RECORD NO.:  [id number]  LOCATION:                                FACILITY:  [facility]  PHYSICIAN:  Donaldo Teegarden L. Juanetta Gosling, M.D.DATE OF BIRTH:  03-17-1976  DATE OF PROCEDURE:  09/07/2012 DATE OF DISCHARGE:                           PULMONARY FUNCTION TEST   REASON FOR PULMONARY FUNCTION TESTING:  Hodgkin lymphoma.  FINDINGS: 1. Spirometry is normal. 2. Lung volume shows some evidence of air trapping, but otherwise     normal. 3. DLCO is mildly reduced, but correct when ventilation is taken into     account. 4. Airway resistance is elevated suggesting some airflow obstruction.     Sina Sumpter L. Juanetta Gosling, M.D.     EL[facility]/MEDQ  D:  09/10/2012  T:  09/11/2012  Job:  [id number]  cc:   Ladona Horns. Mariel Sleet, MD Fax: [phone]

## 2012-09-14 ENCOUNTER — Telehealth (HOSPITAL_COMMUNITY): Payer: Self-pay | Admitting: *Deleted

## 2012-09-14 ENCOUNTER — Encounter (HOSPITAL_BASED_OUTPATIENT_CLINIC_OR_DEPARTMENT_OTHER): Payer: PRIVATE HEALTH INSURANCE

## 2012-09-14 VITALS — BP 144/83 | HR 109 | Temp 98.0°F | Resp 20 | Wt 213.0 lb

## 2012-09-14 DIAGNOSIS — C8119 Nodular sclerosis classical Hodgkin lymphoma, extranodal and solid organ sites: Secondary | ICD-10-CM

## 2012-09-14 DIAGNOSIS — C8190 Hodgkin lymphoma, unspecified, unspecified site: Secondary | ICD-10-CM

## 2012-09-14 DIAGNOSIS — Z5111 Encounter for antineoplastic chemotherapy: Secondary | ICD-10-CM

## 2012-09-14 LAB — CBC WITH DIFFERENTIAL/PLATELET
Basophils Absolute: 0 10*3/uL (ref 0.0–0.1)
Eosinophils Absolute: 0.1 10*3/uL (ref 0.0–0.7)
Eosinophils Relative: 1 % (ref 0–5)
MCH: 27.9 pg (ref 26.0–34.0)
MCV: 83.2 fL (ref 78.0–100.0)
Platelets: 136 10*3/uL — ABNORMAL LOW (ref 150–400)
RDW: 19.5 % — ABNORMAL HIGH (ref 11.5–15.5)
WBC: 6.9 10*3/uL (ref 4.0–10.5)

## 2012-09-14 LAB — COMPREHENSIVE METABOLIC PANEL
ALT: 27 U/L (ref 0–53)
AST: 19 U/L (ref 0–37)
Albumin: 3.4 g/dL — ABNORMAL LOW (ref 3.5–5.2)
Calcium: 9 mg/dL (ref 8.4–10.5)
Sodium: 138 mEq/L (ref 135–145)
Total Protein: 6.2 g/dL (ref 6.0–8.3)

## 2012-09-14 MED ORDER — HEPARIN SOD (PORK) LOCK FLUSH 100 UNIT/ML IV SOLN
500.0000 [IU] | Freq: Once | INTRAVENOUS | Status: DC | PRN
  Filled 2012-09-14: qty 5

## 2012-09-14 MED ORDER — HEPARIN SOD (PORK) LOCK FLUSH 100 UNIT/ML IV SOLN
INTRAVENOUS | Status: AC
  Filled 2012-09-14: qty 5

## 2012-09-14 MED ORDER — SODIUM CHLORIDE 0.9 % IV SOLN
Freq: Once | INTRAVENOUS | Status: AC
  Administered 2012-09-14: 16 mg via INTRAVENOUS
  Filled 2012-09-14: qty 8

## 2012-09-14 MED ORDER — SODIUM CHLORIDE 0.9 % IV SOLN
375.0000 mg/m2 | Freq: Once | INTRAVENOUS | Status: AC
  Administered 2012-09-14: 820 mg via INTRAVENOUS
  Filled 2012-09-14: qty 41

## 2012-09-14 MED ORDER — SODIUM CHLORIDE 0.9 % IV SOLN
10.0000 [IU]/m2 | Freq: Once | INTRAVENOUS | Status: AC
  Administered 2012-09-14: 22 [IU] via INTRAVENOUS
  Filled 2012-09-14: qty 7.3

## 2012-09-14 MED ORDER — DOXORUBICIN HCL CHEMO IV INJECTION 2 MG/ML
25.0000 mg/m2 | Freq: Once | INTRAVENOUS | Status: AC
  Administered 2012-09-14: 54 mg via INTRAVENOUS
  Filled 2012-09-14: qty 27

## 2012-09-14 MED ORDER — SODIUM CHLORIDE 0.9 % IV SOLN
Freq: Once | INTRAVENOUS | Status: AC
  Administered 2012-09-14: 11:00:00 via INTRAVENOUS

## 2012-09-14 MED ORDER — VINBLASTINE SULFATE CHEMO INJECTION 1 MG/ML
6.0000 mg/m2 | Freq: Once | INTRAVENOUS | Status: AC
  Administered 2012-09-14: 13 mg via INTRAVENOUS
  Filled 2012-09-14: qty 13

## 2012-09-14 NOTE — Telephone Encounter (Signed)
Kdur 10 meq # 30 one a day until gone called to [facility] [place] and pt notified.

## 2012-09-15 ENCOUNTER — Encounter (HOSPITAL_BASED_OUTPATIENT_CLINIC_OR_DEPARTMENT_OTHER): Payer: PRIVATE HEALTH INSURANCE

## 2012-09-15 VITALS — BP 143/70 | HR 89 | Temp 98.1°F | Resp 18

## 2012-09-15 DIAGNOSIS — Z5189 Encounter for other specified aftercare: Secondary | ICD-10-CM

## 2012-09-15 DIAGNOSIS — C8119 Nodular sclerosis classical Hodgkin lymphoma, extranodal and solid organ sites: Secondary | ICD-10-CM

## 2012-09-15 DIAGNOSIS — C8190 Hodgkin lymphoma, unspecified, unspecified site: Secondary | ICD-10-CM

## 2012-09-15 MED ORDER — PEGFILGRASTIM INJECTION 6 MG/0.6ML
SUBCUTANEOUS | Status: AC
  Filled 2012-09-15: qty 0.6

## 2012-09-15 MED ORDER — PEGFILGRASTIM INJECTION 6 MG/0.6ML
6.0000 mg | Freq: Once | SUBCUTANEOUS | Status: AC
  Administered 2012-09-15: 6 mg via SUBCUTANEOUS

## 2012-09-15 NOTE — Progress Notes (Signed)
Samuel [name] presents today for injection per MD orders. Neulasta 6mg administered SQ in right Abdomen. Administration without incident. Patient tolerated well.  

## 2012-09-23 LAB — PULMONARY FUNCTION TEST

## 2012-09-28 ENCOUNTER — Emergency Department (HOSPITAL_COMMUNITY): Payer: PRIVATE HEALTH INSURANCE

## 2012-09-28 ENCOUNTER — Encounter (HOSPITAL_COMMUNITY): Payer: Self-pay | Admitting: Emergency Medicine

## 2012-09-28 ENCOUNTER — Inpatient Hospital Stay (HOSPITAL_COMMUNITY): Payer: PRIVATE HEALTH INSURANCE

## 2012-09-28 ENCOUNTER — Other Ambulatory Visit: Payer: Self-pay

## 2012-09-28 ENCOUNTER — Inpatient Hospital Stay (HOSPITAL_COMMUNITY)
Admission: EM | Admit: 2012-09-28 | Discharge: 2012-10-06 | DRG: 193 | Disposition: A | Discharge status: Home / Self Care | Payer: PRIVATE HEALTH INSURANCE | Attending: Internal Medicine | Admitting: Internal Medicine

## 2012-09-28 DIAGNOSIS — H9190 Unspecified hearing loss, unspecified ear: Secondary | ICD-10-CM | POA: Diagnosis present

## 2012-09-28 DIAGNOSIS — T380X5A Adverse effect of glucocorticoids and synthetic analogues, initial encounter: Secondary | ICD-10-CM | POA: Diagnosis not present

## 2012-09-28 DIAGNOSIS — J90 Pleural effusion, not elsewhere classified: Secondary | ICD-10-CM | POA: Diagnosis present

## 2012-09-28 DIAGNOSIS — R0902 Hypoxemia: Secondary | ICD-10-CM

## 2012-09-28 DIAGNOSIS — R739 Hyperglycemia, unspecified: Secondary | ICD-10-CM

## 2012-09-28 DIAGNOSIS — I509 Heart failure, unspecified: Secondary | ICD-10-CM | POA: Diagnosis present

## 2012-09-28 DIAGNOSIS — D696 Thrombocytopenia, unspecified: Secondary | ICD-10-CM

## 2012-09-28 DIAGNOSIS — C8119 Nodular sclerosis classical Hodgkin lymphoma, extranodal and solid organ sites: Secondary | ICD-10-CM

## 2012-09-28 DIAGNOSIS — J9601 Acute respiratory failure with hypoxia: Secondary | ICD-10-CM

## 2012-09-28 DIAGNOSIS — Z87891 Personal history of nicotine dependence: Secondary | ICD-10-CM

## 2012-09-28 DIAGNOSIS — I2699 Other pulmonary embolism without acute cor pulmonale: Secondary | ICD-10-CM

## 2012-09-28 DIAGNOSIS — C8190 Hodgkin lymphoma, unspecified, unspecified site: Secondary | ICD-10-CM

## 2012-09-28 DIAGNOSIS — J189 Pneumonia, unspecified organism: Principal | ICD-10-CM

## 2012-09-28 DIAGNOSIS — J9600 Acute respiratory failure, unspecified whether with hypoxia or hypercapnia: Secondary | ICD-10-CM | POA: Diagnosis present

## 2012-09-28 DIAGNOSIS — T50905A Adverse effect of unspecified drugs, medicaments and biological substances, initial encounter: Secondary | ICD-10-CM

## 2012-09-28 DIAGNOSIS — D638 Anemia in other chronic diseases classified elsewhere: Secondary | ICD-10-CM | POA: Diagnosis present

## 2012-09-28 DIAGNOSIS — Z79899 Other long term (current) drug therapy: Secondary | ICD-10-CM

## 2012-09-28 DIAGNOSIS — R7309 Other abnormal glucose: Secondary | ICD-10-CM | POA: Diagnosis not present

## 2012-09-28 DIAGNOSIS — R0600 Dyspnea, unspecified: Secondary | ICD-10-CM | POA: Diagnosis present

## 2012-09-28 DIAGNOSIS — D649 Anemia, unspecified: Secondary | ICD-10-CM

## 2012-09-28 DIAGNOSIS — I824Y9 Acute embolism and thrombosis of unspecified deep veins of unspecified proximal lower extremity: Secondary | ICD-10-CM | POA: Diagnosis present

## 2012-09-28 HISTORY — DX: Acute respiratory failure with hypoxia: J96.01

## 2012-09-28 HISTORY — DX: Hodgkin lymphoma, unspecified, unspecified site: C81.90

## 2012-09-28 HISTORY — DX: Other pulmonary embolism without acute cor pulmonale: I26.99

## 2012-09-28 HISTORY — DX: Other ill-defined heart diseases: I51.89

## 2012-09-28 LAB — COMPREHENSIVE METABOLIC PANEL
Albumin: 3.2 g/dL — ABNORMAL LOW (ref 3.5–5.2)
BUN: 13 mg/dL (ref 6–23)
Chloride: 99 mEq/L (ref 96–112)
Creatinine, Ser: 1.04 mg/dL (ref 0.50–1.35)
GFR calc non Af Amer: >90 mL/min (ref >90)
Total Bilirubin: 0.7 mg/dL (ref 0.3–1.2)

## 2012-09-28 LAB — CBC WITH DIFFERENTIAL/PLATELET
Basophils Relative: 2 % — ABNORMAL HIGH (ref 0–1)
HCT: 30 % — ABNORMAL LOW (ref 39.0–52.0)
Hemoglobin: 10.1 g/dL — ABNORMAL LOW (ref 13.0–17.0)
MCH: 28.1 pg (ref 26.0–34.0)
MCHC: 33.7 g/dL (ref 30.0–36.0)
Monocytes Absolute: 0.9 10*3/uL (ref 0.1–1.0)
Monocytes Relative: 12 % (ref 3–12)
Neutro Abs: 5.2 10*3/uL (ref 1.7–7.7)

## 2012-09-28 LAB — TROPONIN I: Troponin I: <0.3 ng/mL (ref <0.30)

## 2012-09-28 LAB — PRO B NATRIURETIC PEPTIDE: Pro B Natriuretic peptide (BNP): 8.3 pg/mL (ref 0–125)

## 2012-09-28 MED ORDER — ONDANSETRON HCL 4 MG/2ML IJ SOLN: 4.0000 mg | Freq: Four times a day (QID) | INTRAMUSCULAR | Status: DC | PRN

## 2012-09-28 MED ORDER — SIMETHICONE 80 MG PO CHEW
80.0000 mg | CHEWABLE_TABLET | Freq: Four times a day (QID) | ORAL | Status: DC | PRN
  Filled 2012-09-28: qty 1

## 2012-09-28 MED ORDER — BENZONATATE 100 MG PO CAPS
100.0000 mg | ORAL_CAPSULE | Freq: Three times a day (TID) | ORAL | Status: DC
  Administered 2012-09-28 – 2012-10-01 (×7): 100 mg via ORAL
  Filled 2012-09-28 (×9): qty 1

## 2012-09-28 MED ORDER — ALUM & MAG HYDROXIDE-SIMETH 200-200-20 MG/5ML PO SUSP
30.0000 mL | Freq: Four times a day (QID) | ORAL | Status: DC | PRN
  Filled 2012-09-28: qty 30

## 2012-09-28 MED ORDER — IBUPROFEN 400 MG PO TABS
400.0000 mg | ORAL_TABLET | Freq: Four times a day (QID) | ORAL | Status: DC | PRN
  Administered 2012-09-28: 400 mg via ORAL
  Filled 2012-09-28 (×2): qty 1

## 2012-09-28 MED ORDER — VANCOMYCIN HCL IN DEXTROSE 1-5 GM/200ML-% IV SOLN
1000.0000 mg | Freq: Three times a day (TID) | INTRAVENOUS | Status: DC
  Administered 2012-09-28 – 2012-09-29 (×3): 1000 mg via INTRAVENOUS
  Filled 2012-09-28 (×6): qty 200

## 2012-09-28 MED ORDER — POTASSIUM CHLORIDE CRYS ER 20 MEQ PO TBCR
20.0000 meq | EXTENDED_RELEASE_TABLET | Freq: Every day | ORAL | Status: DC
  Administered 2012-09-28 – 2012-09-30 (×3): 20 meq via ORAL
  Filled 2012-09-28 (×3): qty 1

## 2012-09-28 MED ORDER — ALBUTEROL SULFATE (5 MG/ML) 0.5% IN NEBU
2.5000 mg | INHALATION_SOLUTION | RESPIRATORY_TRACT | Status: DC | PRN
  Administered 2012-09-29 – 2012-09-30 (×2): 2.5 mg via RESPIRATORY_TRACT
  Filled 2012-09-28 (×2): qty 0.5

## 2012-09-28 MED ORDER — POTASSIUM CHLORIDE IN NACL 20-0.9 MEQ/L-% IV SOLN
INTRAVENOUS | Status: DC
  Administered 2012-09-28 – 2012-09-30 (×3): via INTRAVENOUS
  Filled 2012-09-28 (×5): qty 1000

## 2012-09-28 MED ORDER — ONDANSETRON HCL 4 MG PO TABS: 4.0000 mg | ORAL_TABLET | Freq: Four times a day (QID) | ORAL | Status: DC | PRN

## 2012-09-28 MED ORDER — ACETAMINOPHEN 325 MG PO TABS
650.0000 mg | ORAL_TABLET | Freq: Four times a day (QID) | ORAL | Status: DC | PRN
  Filled 2012-09-28 (×2): qty 2

## 2012-09-28 MED ORDER — SODIUM CHLORIDE 0.9 % IV SOLN
INTRAVENOUS | Status: DC
  Administered 2012-09-28: 08:00:00 via INTRAVENOUS

## 2012-09-28 MED ORDER — ACETAMINOPHEN 650 MG RE SUPP: 650.0000 mg | Freq: Four times a day (QID) | RECTAL | Status: DC | PRN

## 2012-09-28 MED ORDER — HYDROCODONE-ACETAMINOPHEN 5-325 MG PO TABS
1.0000 | ORAL_TABLET | ORAL | Status: DC | PRN
  Administered 2012-09-28 (×2): 1 via ORAL
  Administered 2012-09-29: 2 via ORAL
  Filled 2012-09-28: qty 1
  Filled 2012-09-28: qty 2
  Filled 2012-09-28: qty 1

## 2012-09-28 MED ORDER — VANCOMYCIN HCL IN DEXTROSE 1-5 GM/200ML-% IV SOLN
1000.0000 mg | Freq: Once | INTRAVENOUS | Status: DC
  Administered 2012-09-28: 1000 mg via INTRAVENOUS
  Filled 2012-09-28: qty 200

## 2012-09-28 MED ORDER — FAMOTIDINE 10 MG PO TABS
10.0000 mg | ORAL_TABLET | Freq: Every day | ORAL | Status: DC
  Administered 2012-09-28 – 2012-09-30 (×3): 10 mg via ORAL
  Filled 2012-09-28: qty 2
  Filled 2012-09-28 (×2): qty 1

## 2012-09-28 MED ORDER — LEVOFLOXACIN IN D5W 750 MG/150ML IV SOLN
750.0000 mg | INTRAVENOUS | Status: DC
  Administered 2012-09-28 – 2012-10-02 (×5): 750 mg via INTRAVENOUS
  Filled 2012-09-28 (×7): qty 150

## 2012-09-28 MED ORDER — HYDROCOD POLST-CHLORPHEN POLST 10-8 MG/5ML PO LQCR: 5.0000 mL | Freq: Two times a day (BID) | ORAL | Status: DC | PRN

## 2012-09-28 MED ORDER — SODIUM CHLORIDE 0.9 % IJ SOLN
3.0000 mL | Freq: Two times a day (BID) | INTRAMUSCULAR | Status: DC
  Administered 2012-09-28: 3 mL via INTRAVENOUS

## 2012-09-28 NOTE — ED Notes (Signed)
Patient and family state they do not need anything at this time. 

## 2012-09-28 NOTE — H&P (Signed)
[facility] History and Physical  Samuel [name] VQQ:[id number] DOB: [birth year] DOA: 09/28/2012  Referring physician:  PCP: [name], MD  Specialists:   Chief Complaint: weakness, sob  HPI: Samuel [name] is a pleasant 37 y.o. male with pmhx including Hodgin lymphoma and is currently undergoing chemotherapy every 28 days (due today) who presents to [facility] ED with CC weakness, sob. Information obtained from patient and wife who is at the bedside. Pt reports beginning to "feel bad" 3 days ago. States at that time he felt generalized weakness with chills and fever 100.8. Reports feeling more tired than usual. Two days ago developed headache and worsening weakness. Denies CP/palpitation, nausea/vomiting, diarrhea, constipation. Denies dizziness, lightheadedness, visual disturbances. Has been taking in normal amount food and liquid. Yesterday developed worsening sob with exertions and dry cough. States this morning felt no better so came to ED. Symptoms came on suddenly have persisted and worsened. Pt reports taking alieve without relief. Symptoms characterized as moderate. Activity makes worse and nothing makes better. In ED his chest xray is concerning for pneumonia. We are asked to admit   Review of Systems: The patient denies  fever, weight loss,, vision loss, hoarseness, chest pain, syncope, peripheral edema, balance deficits, hemoptysis, abdominal pain, melena, hematochezia, severe indigestion/heartburn, hematuria, incontinence, genital sores, muscle weakness, suspicious skin lesions, transient blindness, difficulty walking, depression, unusual weight change, abnormal bleeding, enlarged lymph nodes, angioedema, and breast masses.    Past Medical History  Diagnosis Date  . Mass of left side of neck 04/2012  . 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 Hodgkin's disease   . Hodgkin lymphoma    Past Surgical History  Procedure Date  . Eye surgery as a  child  . Mass biopsy 05/05/2012    Procedure: NECK MASS BIOPSY;  Surgeon: [name], MD;  Location: [facility];  Service: ENT;  Laterality: Left;  Excisional biopsy of left neck mass   . Portacath placement 06/03/2012    Procedure: INSERTION PORT-A-CATH;  Surgeon: [name], MD;  Location: [facility];  Service: General;  Laterality: Left;  Insertion of Port-A-Cath Left Subclavian  . Bone marrow biopsy   . Bone marrow aspiration    Social History:  reports that he quit smoking about 7 months ago. His smoking use included Cigarettes. He has a 1 pack-year smoking history. He has never used smokeless tobacco. He reports that he does not drink alcohol or use illicit drugs. Pt lives at home with wife. 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 Amoxicillin Rash    No family history on file. Mother 13 years of age. No cancer, CAD, DM, MI Stroke Father 97 years of age with pmhx of "arrythmia". No cancer, CAD, DM stroke MI 3 siblings with no medical history  Prior to Admission medications   Medication Sig Start Date End Date Taking? Authorizing Provider  DACARBAZINE IV Inject into the vein every 28 (twenty-eight) days. Days 1, 15 every 28 days   Yes Historical Provider, MD  dexamethasone (DECADRON) 4 MG tablet Take 8 mg by mouth. Starting the day after chemo, take 2 tablets in the am and 2 tablets in the pm for 3 days.   Yes Historical Provider, MD  DOXOrubicin HCl (ADRIAMYCIN IV) Inject into the vein every 28 (twenty-eight) days. Days 1, 15 every 28 days   Yes Historical Provider, MD  lidocaine-prilocaine (EMLA)  cream Apply topically as needed. Apply a quarter sized amount to port site 1 hour prior to chemo. Do not rub in. Cover with plastic.   Yes Historical Provider, MD  Multiple Vitamin (MULTIVITAMIN) tablet Take 1 tablet by mouth daily.   Yes Historical Provider, MD  ondansetron (ZOFRAN) 8 MG tablet Take 8 mg by mouth. Starting  the day after chemo, take 1 tablet in the am and 1 tablet in the pm for 3 days. Then may take 1 tablet two times a day IF needed for nausea/vomiting. Side Effect: constipation   Yes Historical Provider, MD  potassium chloride SA (K-DUR,KLOR-CON) 20 MEQ tablet Take 20 mEq by mouth daily.   Yes Historical Provider, MD  Ranitidine HCl (ZANTAC PO) Take 1 capsule by mouth as needed.   Yes Historical Provider, MD  simethicone (MYLICON) 80 MG chewable tablet Chew 80 mg by mouth every 6 (six) hours as needed. For gas   Yes Historical Provider, MD  sodium chloride 0.9 % SOLN 50 mL with bleomycin 30 UNITS SOLR Inject into the vein every 28 (twenty-eight) days. Days 1,15 every 28 days  (do not remove this medication from patient's profile even when he is done with chemo)!!! Medical staff need to know that this patient has received this drug if he is to undergo anesthesia (drug starts on 06/09/12)!!!!   Yes Historical Provider, MD  sodium chloride 0.9 % SOLN 50 mL with vinBLAStine 1 MG/ML SOLN Inject into the vein every 28 (twenty-eight) days. Days 1, 15 every 28 days   Yes Historical Provider, MD   Physical Exam: Filed Vitals:   09/28/12 0723 09/28/12 0813 09/28/12 0923 09/28/12 1034  BP: 113/71 122/60 116/58 131/75  Pulse: 91 88 88 92  Temp: 98.6 F (37 C)   97.8 F (36.6 C)  TempSrc: Oral   Oral  Resp: 22 18 20 20   Height: 5\' 9"  (1.753 m)   5\' 9"  (1.753 m)  Weight: 97.523 kg (215 lb)   92.8 kg (204 lb 9.4 oz)  SpO2: 89% 96% 90% 95%     General:  Well nourished, somewhat ill appearing NAD  Eyes: PERRL EOMI   ENT: ears clear, nose without drainage mucus membranes mouth pink slightly dry  Neck: supple no JVD Full rom  Cardiovascular: RRR No MGR No LEE PPP  Respiratory: Breathing is mildly labored. Occasional crackles noted at the right lower lobe.  Abdomen: soft +BS non-tender to palpation. No mass organmegaly  Skin: warm dry, no rash/lesion  Musculoskeletal: MAE no joint swelling erythema  non-tender to palpation. No calf tenderness or bilateral lower extremity erythema or edema.  Psychiatric: cooperative appropriate  Neurologic: cranial nerve II-XI intact. Speech clear facial symmetry  Labs on Admission:  Basic Metabolic Panel:  Lab 09/28/12 4696  NA 135  K 3.6  CL 99  CO2 27  GLUCOSE 122*  BUN 13  CREATININE 1.04  CALCIUM 8.9  MG --  PHOS --   Liver Function Tests:  Lab 09/28/12 0739  AST 25  ALT 24  ALKPHOS 64  BILITOT 0.7  PROT 6.3  ALBUMIN 3.2*   No results found for this basename: LIPASE:5,AMYLASE:5 in the last 168 hours No results found for this basename: AMMONIA:5 in the last 168 hours CBC:  Lab 09/28/12 0739  WBC 7.8  NEUTROABS 5.2  HGB 10.1*  HCT 30.0*  MCV 83.3  PLT 124*   Cardiac Enzymes:  Lab 09/28/12 0830  CKTOTAL --  CKMB --  CKMBINDEX --  TROPONINI <  0.30    BNP (last 3 results)  Basename 09/28/12 0830  PROBNP 8.3   CBG: No results found for this basename: GLUCAP:5 in the last 168 hours  Radiological Exams on Admission: Dg Chest 2 View  09/28/2012  *RADIOLOGY REPORT*  Clinical Data: Shortness of breath.  Hodgkin lymphoma.  CHEST - 2 VIEW  Comparison: PET 09/10/2012 and chest radiograph 06/03/2012.  Findings: Trachea is midline.  Heart size is at the upper limits of normal.  Left subclavian power port tip projects over the SVC. Lungs are somewhat low in volume with bibasilar dependent air space disease.  Tiny right pleural effusion.  IMPRESSION: Bibasilar dependent air space disease with a tiny right pleural effusion.  Findings may be due to congestive heart failure. Pneumonia is not excluded.   Original Report Authenticated By: Leanna Battles, M.D.     EKG: Independently reviewed. ST at 102  Assessment/Plan Principal Problem:  *CAP (community acquired pneumonia):  Admit to tele. Chest xray with right pleural effusion, possibly peripneumonic rather than from congestive heart failure given the relatively normal proBNP.   Currently afebrile, non-toxic appearing and hemodynamically stable. Will continue Levquin and add Vanc as pt is immunocompromised due to  Chemo. Continue supportive care hydration and oxygen. For cough, we'll add Tessalon Perles and as needed Tussionex. Although he is not wheezing, we'll order when necessary albuterol. Monitor  closely   Hypoxia: related to #1. Sat 88-89 % on room air in ED. With oxygen his sats are ranging from 90-96.    Dyspnea: likely related to #1. Chest xray reveals a small right pleural effusion, less likely from CHF given normal proBNP. Recent echo yields EF of 55-60% with grade 2 diastolic dysfunction. On exam pt does not look overloaded. Will monitor intake and output.    Hodgkin's disease:  Stable at baseline. His disease is in remission per Dr. Mariel Sleet. Due for chemo today.  Will notify oncology    Thrombocytopenia: most likely related to chemo, but will check TSH and vitamin B 12 level Chart review indicates current level is in baseline range over last 2 months during chemo.   Anemia: most likely related to chemo. Current level of 10.0 somewhat lower than range over last 2 months. No s/sx bleeding . Will monitor closely.   Code Status: full Family Communication: wife at bedside Disposition Plan:  Home when ready hopefully 2 days or so.  Time spent: 60 minutes  [facility]   If 7PM-7AM, please contact night-coverage [email or web address] Password [email or web address] 09/28/2012, 11:16 AM    Attending:  The patient was seen and evaluated. He was discussed with nurse practitioner, Ms. Vedia Coffer. The above note has been annotated and amended. I have briefly spoken to Dr. Mariel Sleet, seen in the hallway. He was given a brief account of the patient's presentation. He will see the patient later. The patient is mildly dyspneic at rest, which can be attributed to his pneumonia, but will consider further evaluation with a CT angiogram of the chest if he does not improve  clinically over the next 48 hours. He has no signs of volume overload. His lower extremities reveal no edema and no calf pain. His small right pleural effusion is likely parapneumonic and less likely CHF given his normal proBNP. His serum potassium level is borderline low, will therefore, add potassium to the IV fluids and start potassium chloride supplementation daily. We'll order TSH and vitamin B12 level for assessment of thrombocytopenia, although, it is likely that his thrombocytopenia and anemia  are related to chemotherapy as stated above.

## 2012-09-28 NOTE — Progress Notes (Signed)
UR Chart Review Completed  

## 2012-09-28 NOTE — Progress Notes (Signed)
ANTIBIOTIC CONSULT NOTE - INITIAL  Pharmacy Consult for Vancomycin and Levaquin Indication: pneumonia  Allergies  Allergen Reactions  . Amoxicillin Rash   Patient Measurements: Height: 5\' 9"  (175.3 cm) Weight: 204 lb 9.4 oz (92.8 kg) IBW/kg (Calculated) : 70.7   Vital Signs: Temp: 97.8 F (36.6 C) (02/03 1034) Temp src: Oral (02/03 1034) BP: 131/75 mmHg (02/03 1034) Pulse Rate: 92  (02/03 1034) Intake/Output from previous day:   Intake/Output from this shift:    Labs:  [facility] 09/28/12 0739  WBC 7.8  HGB 10.1*  PLT 124*  LABCREA --  CREATININE 1.04   Estimated Creatinine Clearance: 110.4 ml/min (by C-G formula based on Cr of 1.04). No results found for this basename: VANCOTROUGH:2,VANCOPEAK:2,VANCORANDOM:2,GENTTROUGH:2,GENTPEAK:2,GENTRANDOM:2,TOBRATROUGH:2,TOBRAPEAK:2,TOBRARND:2,AMIKACINPEAK:2,AMIKACINTROU:2,AMIKACIN:2, in the last 72 hours   Microbiology: No results found for this or any previous visit (from the past 720 hour(s)).  Medical History: Past Medical History  Diagnosis Date  . Mass of left side of neck 04/2012  . 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 Hodgkin's disease   . Hodgkin lymphoma    Medications:  Scheduled:    . famotidine  10 mg Oral Daily  . levofloxacin  750 mg Intravenous Q24H  . sodium chloride  3 mL Intravenous Q12H  . vancomycin  1,000 mg Intravenous Q8H  . [DISCONTINUED] vancomycin  1,000 mg Intravenous Once   Assessment: 37yo male c/o SOB, coughing, and fever.  Admitted for pna and started on Vancomycin and Levaquin.  Good renal fxn.  Estimated Creatinine Clearance: 110.4 ml/min (by C-G formula based on Cr of 1.04).  Goal of Therapy:  Vancomycin trough level 15-20 mcg/ml  Plan: Vancomycin 1gm IV q8hrs Check trough at steady state Levaquin 750mg  IV q24hrs Monitor labs, renal fxn, and cultures per protocol Duration of therapy per MD  Valrie Hart A 09/28/2012,11:23 AM

## 2012-09-28 NOTE — ED Notes (Signed)
Patient also reports feeling very fatigued easily and heart racing before he "starts to get really short of breath."

## 2012-09-28 NOTE — ED Notes (Signed)
Pt's O2 sats remain 90-91% on 2L [place], titrated to 3L [place] increased to 93% at this time.

## 2012-09-28 NOTE — ED Provider Notes (Addendum)
History  This chart was scribed for Samuel Lennert, MD by Ardeen Jourdain, ED Scribe. This patient was seen in room APA06/[facility]06 and the patient's care was started at 0727.  CSN: [id number]  Arrival date & time 09/28/12  0712   First MD Initiated Contact with Patient 09/28/12 [phone]      Chief Complaint  Patient presents with  . Shortness of Breath     Patient is a 37 y.o. male presenting with shortness of breath. The history is provided by the patient. No language interpreter was used.  Shortness of Breath  The current episode started 3 to 5 days ago. The onset was gradual. The problem occurs continuously. The problem has been gradually worsening. The problem is mild. The symptoms are aggravated by activity. Associated symptoms include a fever, sore throat, cough and shortness of breath. Pertinent negatives include no chest pain. The fever has been present for 1 to 2 days. The maximum temperature noted was 100.4 to 100.9 F. The temperature was taken using an oral thermometer. The cough is non-productive. Nothing relieves the cough. He has been experiencing a mild sore throat.    Samuel [name] is a 37 y.o. male with a h/o hodgkin's lymphoma who presents to the Emergency Department complaining of SOB. He denies any nausea and emesis as associated symptoms. He describes the feeling as if "he can't catch his breath." He states he is currently receiving chemotherapy and is supposed to have a treatment today.   Past Medical History  Diagnosis Date  . Mass of left side of neck 04/2012  . 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 Hodgkin's disease   . Hodgkin lymphoma     Past Surgical History  Procedure Date  . Eye surgery as a child  . Mass biopsy 05/05/2012    Procedure: NECK MASS BIOPSY;  Surgeon: [name], MD;  Location: [facility];  Service: ENT;  Laterality: Left;  Excisional biopsy of left neck mass   . Portacath placement 06/03/2012   Procedure: INSERTION PORT-A-CATH;  Surgeon: [name], MD;  Location: [facility];  Service: General;  Laterality: Left;  Insertion of Port-A-Cath Left Subclavian  . Bone marrow biopsy   . Bone marrow aspiration     No family history on file.  History  Substance Use Topics  . Smoking status: Former Smoker -- 0.1 packs/day for 10 years    Types: Cigarettes    Quit date: 02/26/2012  . Smokeless tobacco: Never Used  . Alcohol Use: No      Review of Systems  Constitutional: Positive for fever. Negative for fatigue.  HENT: Positive for sore throat. Negative for congestion, sinus pressure and ear discharge.   Eyes: Negative for discharge.  Respiratory: Positive for cough and shortness of breath.   Cardiovascular: Negative for chest pain.  Gastrointestinal: Negative for nausea, vomiting, abdominal pain and diarrhea.  Genitourinary: Negative for frequency and hematuria.  Musculoskeletal: Negative for back pain.  Skin: Negative for rash.  Neurological: Positive for headaches. Negative for seizures.  Hematological: Negative.   Psychiatric/Behavioral: Negative for hallucinations.  All other systems reviewed and are negative.    Allergies  Amoxicillin  Home Medications   Current Outpatient Rx  Name  Route  Sig  Dispense  Refill  . ALLOPURINOL 300 MG PO TABS      TAKE 1 TABLET EVERY DAY   30 tablet   1   .  DACARBAZINE IV   Intravenous   Inject into the vein every 28 (twenty-eight) days. Days 1, 15 every 28 days         . DEXAMETHASONE 4 MG PO TABS   Oral   Take 8 mg by mouth. Starting the day after chemo, take 2 tablets in the am and 2 tablets in the pm for 3 days.         Marland Kitchen DIPHENHYDRAMINE HCL 25 MG PO TABS   Oral   Take 25 mg by mouth every 6 (six) hours as needed. Pain         . ADRIAMYCIN IV   Intravenous   Inject into the vein every 28 (twenty-eight) days. Days 1, 15 every 28 days         . HYDROCODONE-ACETAMINOPHEN 5-500 MG PO TABS   Oral   Take 1-2  tablets by mouth every 4 (four) hours as needed. Pain   40 tablet   0   . LIDOCAINE-PRILOCAINE 2.5-2.5 % EX CREA   Topical   Apply topically as needed. Apply a quarter sized amount to port site 1 hour prior to chemo. Do not rub in. Cover with plastic.         Marland Kitchen LORAZEPAM 1 MG PO TABS   Oral   Take 1 mg by mouth every 4 (four) hours as needed. Nausea/vomiting         . ONE-DAILY MULTI VITAMINS PO TABS   Oral   Take 1 tablet by mouth daily.         Marland Kitchen ONDANSETRON HCL 8 MG PO TABS   Oral   Take 8 mg by mouth. Starting the day after chemo, take 1 tablet in the am and 1 tablet in the pm for 3 days. Then may take 1 tablet two times a day IF needed for nausea/vomiting. Side Effect: constipation         . ZANTAC PO   Oral   Take 1 capsule by mouth as needed.         Marland Kitchen BLEOMYCIN CHEMO IV INFUSION   Intravenous   Inject into the vein every 28 (twenty-eight) days. Days 1,15 every 28 days  (do not remove this medication from patient's profile even when he is done with chemo)!!! Medical staff need to know that this patient has received this drug if he is to undergo anesthesia (drug starts on 06/09/12)!!!!         . VINBLASTINE CHEMO IV INFUSION   Intravenous   Inject into the vein every 28 (twenty-eight) days. Days 1, 15 every 28 days           Triage Vitals: BP 113/71  Pulse 91  Temp 98.6 F (37 C) (Oral)  Resp 22  Ht 5\' 9"  (1.753 m)  Wt 215 lb (97.523 kg)  BMI 31.75 kg/m2  SpO2 89%  Physical Exam  Nursing note and vitals reviewed. Constitutional: He is oriented to person, place, and time. He appears well-developed.  HENT:  Head: Normocephalic and atraumatic.  Eyes: Conjunctivae normal and EOM are normal. No scleral icterus.  Neck: Neck supple. No thyromegaly present.  Cardiovascular: Normal rate, regular rhythm and normal heart sounds.  Exam reveals no gallop and no friction rub.   No murmur heard. Pulmonary/Chest: Effort normal and breath sounds normal. No  stridor. No respiratory distress. He has no wheezes. He has no rales. He exhibits no tenderness.  Abdominal: Soft. Bowel sounds are normal. He exhibits no distension and no mass. There  is no tenderness. There is no rebound and no guarding.  Musculoskeletal: Normal range of motion. He exhibits no edema.  Lymphadenopathy:    He has no cervical adenopathy.  Neurological: He is oriented to person, place, and time. Coordination normal.  Skin: No rash noted. No erythema.  Psychiatric: He has a normal mood and affect. His behavior is normal.    ED Course  Procedures (including critical care time)  DIAGNOSTIC STUDIES: Oxygen Saturation is 89% on [place], low by my interpretation.    COORDINATION OF CARE:  7:33 AM: Discussed treatment plan which includes CXR, EKG, IV fluids, CMP and CBC with pt at bedside and pt agreed to plan.    Labs Reviewed - No data to display No results found.   No diagnosis found.    MDM      Date: 09/28/2012  Rate:102  Rhythm: sinus tachycardia  QRS Axis: normal  Intervals: normal  ST/T Wave abnormalities: normal  Conduction Disutrbances:nonspecific intraventricular conduction delay  Narrative Interpretation:   Old EKG Reviewed: none available    The chart was scribed for me under my direct supervision.  I personally performed the history, physical, and medical decision making and all procedures in the evaluation of this patient.Samuel Lennert, MD 09/28/12 1610  Samuel Lennert, MD 09/28/12 [phone]

## 2012-09-28 NOTE — ED Notes (Addendum)
Patient c/o shortness of breath x2 days. Per wife patient had fever of 100.7 on Friday in which he took aleve and fever resolved. Per wife patient has moments where he is gasping for air. Patient reports occasional productive cough. Patient Hodgkin cancer patient receiving chemo. Patient also c/o bilateral flank pain but states he has had this pain since he started chemo.

## 2012-09-28 NOTE — Progress Notes (Signed)
Patient has temp of 101.3,Dr Rito Ehrlich notified.

## 2012-09-28 NOTE — Consult Note (Signed)
[facility]  Name: Samuel [name]      MRN: [id number]    Location: N829/F621-30  Date: 09/28/2012 Time:5:05 PM   REFERRING PHYSICIAN:  Elliot Cousin, MD  REASON FOR CONSULT:  Presumed Pneumonia   DIAGNOSIS:  Hodgkin's Lymphoma, CR per PET scan Criteria  HISTORY OF PRESENT ILLNESS:   Samuel [name] is a 37 yo Caucasian man who is well-known to the [facility] where he undergoes chemotherapy for his STage IIA, Nodular Sclerosing Hodgkin's Disease, now Day 15 of cycle 4 of chemotherapy consisting of Bleomycin, Dacarbazine, Doxorubicin, and Vinblastine D1 and D15 every 28 days.    He reported to the ED today with weakness, fatigue, SOB, and fevers at home of 100.8 F.  He started to feel poor about 3 days ago. He has some sick contacts at home, namely his children.   An X-ray of chest performed in ED demonstrated a possible pneumonia.  I personally reviewed and went over radiographic studies with the patient.  Blood cultures were collected and pending.   I personally reviewed and went over laboratory results with the patient. Labs are noted and impressive for mild anemia and mild thrombocytopenia.  Likely chemotherapy induced and potential reactive to his acute infection. WBC count is adequate and ANC is solid at 5.2 today.   Xray is reviewed personally with Dr. Mariel Sleet.  He has B/L infiltrates.  PAST MEDICAL HISTORY:   Past Medical History  Diagnosis Date  . Mass of left side of neck 04/2012  . 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 Hodgkin's disease   . Hodgkin lymphoma   . Diastolic dysfunction     Grade 2    ALLERGIES: Allergies  Allergen Reactions  . Amoxicillin Rash      MEDICATIONS: I have reviewed the patient's current medications.     PAST SURGICAL HISTORY Past Surgical History  Procedure Date  . Eye surgery as a child  . Mass biopsy 05/05/2012    Procedure: NECK MASS BIOPSY;  Surgeon: [name],  MD;  Location: [facility];  Service: ENT;  Laterality: Left;  Excisional biopsy of left neck mass   . Portacath placement 06/03/2012    Procedure: INSERTION PORT-A-CATH;  Surgeon: [name], MD;  Location: [facility];  Service: General;  Laterality: Left;  Insertion of Port-A-Cath Left Subclavian  . Bone marrow biopsy   . Bone marrow aspiration     FAMILY HISTORY: No family history on file.  SOCIAL HISTORY:  reports that he quit smoking about 7 months ago. His smoking use included Cigarettes. He has a 1 pack-year smoking history. He has never used smokeless tobacco. He reports that he does not drink alcohol or use illicit drugs.  PERFORMANCE STATUS: The patient's performance status is 2 - Symptomatic, <50% confined to bed  PHYSICAL EXAM: Most Recent Vital Signs: Blood pressure 124/77, pulse 84, temperature 98.2 F (36.8 C), temperature source Oral, resp. rate 20, height 5\' 9"  (1.753 m), weight 204 lb 9.4 oz (92.8 kg), SpO2 96.00%. General appearance: alert, cooperative, appears stated age, mild distress and occasional labored breathing, nasal cannula in place, alopecia Head: Normocephalic, without obvious abnormality, atraumatic Neck: no adenopathy and supple, symmetrical, trachea midline Lungs: rhonchi bibasilar Heart: regular rate and rhythm, S1, S2 normal, no murmur, click, rub or gallop Abdomen: soft, non-tender; bowel sounds normal; no masses,  no organomegaly Extremities: extremities normal, atraumatic, no cyanosis  or edema Pulses: 2+ and symmetric Skin: Skin color, texture, turgor normal. No rashes or lesions Lymph nodes: Cervical, supraclavicular, and axillary nodes normal. Neurologic: Grossly normal  LABORATORY DATA:  Results for orders placed during the hospital encounter of 09/28/12 (from the past 48 hour(s))  CBC WITH DIFFERENTIAL     Status: Abnormal   Collection Time   09/28/12  7:39 AM      Component Value Range Comment   WBC 7.8  4.0 - 10.5 K/uL     RBC 3.60 (*) 4.22 - 5.81 MIL/uL    Hemoglobin 10.1 (*) 13.0 - 17.0 g/dL    HCT 78.2 (*) 95.6 - 52.0 %    MCV 83.3  78.0 - 100.0 fL    MCH 28.1  26.0 - 34.0 pg    MCHC 33.7  30.0 - 36.0 g/dL    RDW 21.3 (*) 08.6 - 15.5 %    Platelets 124 (*) 150 - 400 K/uL    Neutrophils Relative 67  43 - 77 %    Neutro Abs 5.2  1.7 - 7.7 K/uL    Lymphocytes Relative 19  12 - 46 %    Lymphs Abs 1.4  0.7 - 4.0 K/uL    Monocytes Relative 12  3 - 12 %    Monocytes Absolute 0.9  0.1 - 1.0 K/uL    Eosinophils Relative 1  0 - 5 %    Eosinophils Absolute 0.1  0.0 - 0.7 K/uL    Basophils Relative 2 (*) 0 - 1 %    Basophils Absolute 0.2 (*) 0.0 - 0.1 K/uL   COMPREHENSIVE METABOLIC PANEL     Status: Abnormal   Collection Time   09/28/12  7:39 AM      Component Value Range Comment   Sodium 135  135 - 145 mEq/L    Potassium 3.6  3.5 - 5.1 mEq/L    Chloride 99  96 - 112 mEq/L    CO2 27  19 - 32 mEq/L    Glucose, Bld 122 (*) 70 - 99 mg/dL    BUN 13  6 - 23 mg/dL    Creatinine, Ser 5.78  0.50 - 1.35 mg/dL    Calcium 8.9  8.4 - 46.9 mg/dL    Total Protein 6.3  6.0 - 8.3 g/dL    Albumin 3.2 (*) 3.5 - 5.2 g/dL    AST 25  0 - 37 U/L    ALT 24  0 - 53 U/L    Alkaline Phosphatase 64  39 - 117 U/L    Total Bilirubin 0.7  0.3 - 1.2 mg/dL    GFR calc non Af Amer >90  >90 mL/min    GFR calc Af Amer >90  >90 mL/min   PRO B NATRIURETIC PEPTIDE     Status: Normal   Collection Time   09/28/12  8:30 AM      Component Value Range Comment   Pro B Natriuretic peptide (BNP) 8.3  0 - 125 pg/mL   TROPONIN I     Status: Normal   Collection Time   09/28/12  8:30 AM      Component Value Range Comment   Troponin I <0.30  <0.30 ng/mL   CULTURE, BLOOD (ROUTINE X 2)     Status: Normal (Preliminary result)   Collection Time   09/28/12  8:48 AM      Component Value Range Comment   Specimen Description BLOOD LEFT ANTECUBITAL      Special  Requests        Value: BOTTLES DRAWN AEROBIC AND ANAEROBIC AEB=8CC ANA=6CC   Culture NO GROWTH  <24 HRS      Report Status PENDING     CULTURE, BLOOD (ROUTINE X 2)     Status: Normal (Preliminary result)   Collection Time   09/28/12  8:51 AM      Component Value Range Comment   Specimen Description BLOOD LEFT ANTECUBITAL      Special Requests BOTTLES DRAWN AEROBIC AND ANAEROBIC 6CC      Culture NO GROWTH <24 HRS      Report Status PENDING         RADIOGRAPHY: Dg Chest 2 View  09/28/2012  *RADIOLOGY REPORT*  Clinical Data: Shortness of breath.  Hodgkin lymphoma.  CHEST - 2 VIEW  Comparison: PET 09/10/2012 and chest radiograph 06/03/2012.  Findings: Trachea is midline.  Heart size is at the upper limits of normal.  Left subclavian power port tip projects over the SVC. Lungs are somewhat low in volume with bibasilar dependent air space disease.  Tiny right pleural effusion.  IMPRESSION: Bibasilar dependent air space disease with a tiny right pleural effusion.  Findings may be due to congestive heart failure. Pneumonia is not excluded.   Original Report Authenticated By: Leanna Battles, M.D.      ASSESSMENT:  1. Hodgkin's Lymphoma Stage IIA, Nodular Sclerosing, Day 15 of cycle 4 of chemotherapy consisting of Bleomycin, Dacarbazine, Doxorubicin, and Vinblastine D1 and D15 every 28 days.  2. B/L infiltrates in lung, preseumed to be pneumonia. Must keep Bleomycin lung toxicity in the differential.  If patient does not improve quickly, would not hesitate to consult Dr. Juanetta Gosling 3. SOB 4. Dyspnea  5. Anemia, stable 6. Thrombocytopenia, mild, stable   PLAN:  1. Continue antibiotics for presumed pneumonia. 2. Continue supportive care with O2. 3. I personally reviewed and went over laboratory results with the patient. 4. I personally reviewed and went over radiographic studies with the patient. 5. Will continue to follow as an inpatient.  Will defer chemotherapy for the time being.   All questions were answered. The patient knows to call the clinic with any problems, questions or concerns. We  can certainly see the patient much sooner if necessary.  The patient and plan discussed with Glenford Peers, MD and he is in agreement with the aforementioned.  KEFALAS,THOMAS

## 2012-09-29 ENCOUNTER — Encounter (HOSPITAL_COMMUNITY): Payer: Self-pay | Admitting: Internal Medicine

## 2012-09-29 ENCOUNTER — Ambulatory Visit (HOSPITAL_COMMUNITY): Payer: PRIVATE HEALTH INSURANCE

## 2012-09-29 ENCOUNTER — Inpatient Hospital Stay (HOSPITAL_COMMUNITY): Payer: PRIVATE HEALTH INSURANCE

## 2012-09-29 DIAGNOSIS — J9601 Acute respiratory failure with hypoxia: Secondary | ICD-10-CM

## 2012-09-29 DIAGNOSIS — J9600 Acute respiratory failure, unspecified whether with hypoxia or hypercapnia: Secondary | ICD-10-CM

## 2012-09-29 DIAGNOSIS — I2699 Other pulmonary embolism without acute cor pulmonale: Secondary | ICD-10-CM

## 2012-09-29 HISTORY — DX: Acute respiratory failure with hypoxia: J96.01

## 2012-09-29 HISTORY — DX: Other pulmonary embolism without acute cor pulmonale: I26.99

## 2012-09-29 LAB — BLOOD GAS, ARTERIAL
O2 Content: 6 L/min
O2 Saturation: 96.4 %
Patient temperature: 37

## 2012-09-29 LAB — CBC
Platelets: 159 10*3/uL (ref 150–400)
RDW: 18.9 % — ABNORMAL HIGH (ref 11.5–15.5)
WBC: 9.2 10*3/uL (ref 4.0–10.5)

## 2012-09-29 LAB — COMPREHENSIVE METABOLIC PANEL
Alkaline Phosphatase: 62 U/L (ref 39–117)
BUN: 8 mg/dL (ref 6–23)
Chloride: 104 mEq/L (ref 96–112)
GFR calc Af Amer: >90 mL/min (ref >90)
GFR calc non Af Amer: >90 mL/min (ref >90)
Glucose, Bld: 112 mg/dL — ABNORMAL HIGH (ref 70–99)
Potassium: 4.3 mEq/L (ref 3.5–5.1)
Total Bilirubin: 0.5 mg/dL (ref 0.3–1.2)

## 2012-09-29 MED ORDER — OXYMETAZOLINE HCL 0.05 % NA SOLN
1.0000 | Freq: Two times a day (BID) | NASAL | Status: DC
  Administered 2012-09-29 (×2): 1 via NASAL
  Filled 2012-09-29: qty 15

## 2012-09-29 MED ORDER — SULFAMETHOXAZOLE-TRIMETHOPRIM 400-80 MG/5ML IV SOLN
320.0000 mg | Freq: Four times a day (QID) | INTRAVENOUS | Status: DC
  Administered 2012-09-29 – 2012-10-04 (×17): 320 mg via INTRAVENOUS
  Filled 2012-09-29 (×33): qty 20

## 2012-09-29 MED ORDER — IOHEXOL 350 MG/ML SOLN
100.0000 mL | Freq: Once | INTRAVENOUS | Status: AC | PRN
  Administered 2012-09-29: 100 mL via INTRAVENOUS

## 2012-09-29 MED ORDER — VANCOMYCIN HCL 10 G IV SOLR
1250.0000 mg | Freq: Three times a day (TID) | INTRAVENOUS | Status: DC
  Administered 2012-09-30 – 2012-10-01 (×4): 1250 mg via INTRAVENOUS
  Filled 2012-09-29 (×10): qty 1250

## 2012-09-29 MED ORDER — ENOXAPARIN SODIUM 100 MG/ML ~~LOC~~ SOLN
90.0000 mg | Freq: Two times a day (BID) | SUBCUTANEOUS | Status: DC
  Administered 2012-09-29 – 2012-09-30 (×3): 90 mg via SUBCUTANEOUS
  Filled 2012-09-29 (×4): qty 1

## 2012-09-29 MED ORDER — SALINE SPRAY 0.65 % NA SOLN
1.0000 | NASAL | Status: DC | PRN
  Administered 2012-09-29: 1 via NASAL
  Filled 2012-09-29 (×2): qty 44

## 2012-09-29 NOTE — Progress Notes (Addendum)
ANTIBIOTIC CONSULT NOTE - INITIAL  Pharmacy Consult for Vancomycin and Levaquin Indication: pneumonia  Allergies  Allergen Reactions  . Amoxicillin Rash   Patient Measurements: Height: 5\' 9"  (175.3 cm) Weight: 204 lb 9.4 oz (92.8 kg) IBW/kg (Calculated) : 70.7   Vital Signs: Temp: 100.8 F (38.2 C) (02/04 0842) Temp src: Oral (02/04 0842) BP: 116/57 mmHg (02/04 0453) Pulse Rate: 101  (02/04 0453) Intake/Output from previous day: 02/03 0701 - 02/04 0700 In: 2086 [P.O.:720; I.V.:966; IV Piggyback:400] Out: 800 [Urine:800] Intake/Output from this shift:    Labs:  Basename 09/29/12 0537 09/28/12 0739  WBC 9.2 7.8  HGB 10.0* 10.1*  PLT 159 124*  LABCREA -- --  CREATININE 0.98 1.04   Estimated Creatinine Clearance: 117.2 ml/min (by C-G formula based on Cr of 0.98).  Basename 09/29/12 0848  VANCOTROUGH 13.1  VANCOPEAK --  VANCORANDOM --  GENTTROUGH --  GENTPEAK --  GENTRANDOM --  TOBRATROUGH --  TOBRAPEAK --  TOBRARND --  AMIKACINPEAK --  AMIKACINTROU --  AMIKACIN --     Microbiology: Recent Results (from the past 720 hour(s))  CULTURE, BLOOD (ROUTINE X 2)     Status: Normal (Preliminary result)   Collection Time   09/28/12  8:48 AM      Component Value Range Status Comment   Specimen Description BLOOD LEFT ANTECUBITAL   Final    Special Requests     Final    Value: BOTTLES DRAWN AEROBIC AND ANAEROBIC AEB=8CC ANA=6CC   Culture NO GROWTH 1 DAY   Final    Report Status PENDING   Incomplete   CULTURE, BLOOD (ROUTINE X 2)     Status: Normal (Preliminary result)   Collection Time   09/28/12  8:51 AM      Component Value Range Status Comment   Specimen Description BLOOD LEFT ANTECUBITAL   Final    Special Requests BOTTLES DRAWN AEROBIC AND ANAEROBIC 6CC   Final    Culture NO GROWTH 1 DAY   Final    Report Status PENDING   Incomplete     Medical History: Past Medical History  Diagnosis Date  . Mass of left side of neck 04/2012  . Bilateral hearing loss    due to numerous ear infections as a child  . Hodgkin's disease   . Hodgkin lymphoma   . Diastolic dysfunction     Grade 2   Medications:  Scheduled:     . benzonatate  100 mg Oral TID  . enoxaparin (LOVENOX) injection  90 mg Subcutaneous Q12H  . famotidine  10 mg Oral Daily  . levofloxacin  750 mg Intravenous Q24H  . potassium chloride  20 mEq Oral Daily  . sodium chloride  3 mL Intravenous Q12H  . vancomycin  1,000 mg Intravenous Q8H  . [COMPLETED] vancomycin  1,000 mg Intravenous Once   Assessment: 36yo immunocompromised male on day#2 Vancomycin and Levaquin for PNA.  Good renal fxn.  Still spiking fevers. Vancomycin trough slightly below desired goal range.  Goal of Therapy:  Vancomycin trough level 15-20 mcg/ml  Plan: Increase Vancomycin 1250mg  IV q8hrs Levaquin 750mg  IV q24hrs -change to oral as appropriate Weekly Vancomycin trough Monitor renal fxn and cultures  Duration of therapy per MD  Elson Clan 09/29/2012,10:25 AM   Also added Bactrim to cover PCP in immunocompromised patient.  1) Bactrim 320mg  (TMP component) IV q6h (~18mg /kg/day IBW)

## 2012-09-29 NOTE — Progress Notes (Signed)
[facility] PROGRESS NOTE  Samuel [name] ZOX:[id number] DOB: 11-27-1975 DOA: 09/28/2012 PCP: [name], MD  Assessment/Plan: CAP (community acquired pneumonia): CT angiogram scan of the chest ordered earlier to assess for PE and assessment of pneumonia. There is a focal pulmonary embolus in the right lower lobe pulmonary artery and widespread alveolar pneumonitis query ARDS. I doubt that the patient has ARDS, although he is requiring more oxygen. His ABG today on 6 L reveal a pH 7.4, PCO2 37, and PO2 72. The patient says that he has nasal congestion and may not be getting adequate oxygen through the nasal cannula. Will therefore discontinue nasal cannula and provide oxygen supplementation via the Ventimask. We'll broaden antibiotic therapy to include IV Septra. This was discussed with oncologist Dr. Mariel Sleet. We'll continue IV Levaquin and vancomycin, day #2. Continue supportive treatment with Tessalon Perles and as needed Tussionex and as needed albuterol nebulizer. Add Afrin nasal spray twice a day.  Pulmonary emboli: per CT. Will start lovenox per pharmacy. Will hold off on Coumadin for the next few days, per the recommendation of Dr. Mariel Sleet. The patient will need anticoagulation for at least 6 months. Monitor closely.  Acute respiratory failure with hypoxia: related to #1 and #2. Somewhat worse this am.  Sat 88% on 3L initially with HR 119. Breathing shallowly. When coached to slow respers down, breath deeply through nose and exhale deliberately, sats increased to 92% and HF 105. Pt complained of "nose being stopped up". As above, Afrin nasal spray was started. We'll continue when necessary saline nasal spray. Oxygen supplementation changed to the Ventimask.     Dyspnea: likely related to #1 and #2. Chest xray reveals a small right pleural effusion, less likely from CHF given normal proBNP. Recent echo yields EF of 55-60% with grade 2 diastolic dysfunction.  See #1 and #2. Volume status  +1.2. IV fluids will be changed to [facility] given that he is receiving fluids with his antibiotics.   Hodgkin's disease: Stable at baseline. His disease is in remission per Dr. Mariel Sleet. Appreciate oncs assistance.    Thrombocytopenia: most likely related to chemo. Now resolved. He is status post Neulasta a couple of weeks ago per oncology. His TSH is within normal limits at 1.6 and vitamin B 12 1789.   Anemia: most likely related to chemo. Stable at 10.0. No s/sx bleeding . Will monitor closely  Code Status: full Family Communication: wife at bedside Disposition Plan: home when ready    Consultants:  Dr. Mariel Sleet Oncology  Procedures:  none  Antibiotics: Levaquin 09/28/12>>>> Vancomycin 09/28/12>>>> HPI/Subjective: Lying in bed complains of nasal congestion and feeling short of breath.   Objective: Filed Vitals:   09/29/12 1059 09/29/12 1320 09/29/12 1410 09/29/12 1419  BP:    119/53  Pulse:    95  Temp: 100.1 F (37.8 C)   99.4 F (37.4 C)  TempSrc: Oral   Oral  Resp:    18  Height:      Weight:      SpO2:  95% 98% 97%    Intake/Output Summary (Last 24 hours) at 09/29/12 1515 Last data filed at 09/29/12 1400  Gross per 24 hour  Intake 2588.67 ml  Output    550 ml  Net 2038.67 ml   Filed Weights   09/28/12 0723 09/28/12 1034  Weight: 97.523 kg (215 lb) 92.8 kg (204 lb 9.4 oz)    Exam:   General:  Awake alert somewhat ill appearing.  Cardiovascular: Mildly tachycardic regular No MGR No  LEE  Respiratory: Less tachypneic and appears to be breathing more comfortably than yesterday. Fine crackles in the bases without audible wheezing or rhonchi.  Abdomen: soft +BS non-tender to palpation  Extremities: No pedal edema.  Neurologic: Cranial nerves II through XII are grossly intact. Speech is clear.  Data Reviewed: Basic Metabolic Panel:  Lab 09/29/12 2952 09/28/12 0739  NA 136 135  K 4.3 3.6  CL 104 99  CO2 27 27  GLUCOSE 112* 122*  BUN 8 13   CREATININE 0.98 1.04  CALCIUM 8.8 8.9  MG -- --  PHOS -- --   Liver Function Tests:  Lab 09/29/12 0537 09/28/12 0739  AST 24 25  ALT 20 24  ALKPHOS 62 64  BILITOT 0.5 0.7  PROT 5.8* 6.3  ALBUMIN 2.7* 3.2*   No results found for this basename: LIPASE:5,AMYLASE:5 in the last 168 hours No results found for this basename: AMMONIA:5 in the last 168 hours CBC:  Lab 09/29/12 0537 09/28/12 0739  WBC 9.2 7.8  NEUTROABS -- 5.2  HGB 10.0* 10.1*  HCT 30.6* 30.0*  MCV 84.8 83.3  PLT 159 124*   Cardiac Enzymes:  Lab 09/28/12 0830  CKTOTAL --  CKMB --  CKMBINDEX --  TROPONINI <0.30   BNP (last 3 results)  Basename 09/28/12 0830  PROBNP 8.3   CBG: No results found for this basename: GLUCAP:5 in the last 168 hours  Recent Results (from the past 240 hour(s))  CULTURE, BLOOD (ROUTINE X 2)     Status: Normal (Preliminary result)   Collection Time   09/28/12  8:48 AM      Component Value Range Status Comment   Specimen Description BLOOD LEFT ANTECUBITAL   Final    Special Requests     Final    Value: BOTTLES DRAWN AEROBIC AND ANAEROBIC AEB=8CC ANA=6CC   Culture NO GROWTH 1 DAY   Final    Report Status PENDING   Incomplete   CULTURE, BLOOD (ROUTINE X 2)     Status: Normal (Preliminary result)   Collection Time   09/28/12  8:51 AM      Component Value Range Status Comment   Specimen Description BLOOD LEFT ANTECUBITAL   Final    Special Requests BOTTLES DRAWN AEROBIC AND ANAEROBIC 6CC   Final    Culture NO GROWTH 1 DAY   Final    Report Status PENDING   Incomplete      Studies: Dg Chest 2 View  09/28/2012  *RADIOLOGY REPORT*  Clinical Data: Shortness of breath.  Hodgkin lymphoma.  CHEST - 2 VIEW  Comparison: PET 09/10/2012 and chest radiograph 06/03/2012.  Findings: Trachea is midline.  Heart size is at the upper limits of normal.  Left subclavian power port tip projects over the SVC. Lungs are somewhat low in volume with bibasilar dependent air space disease.  Tiny right  pleural effusion.  IMPRESSION: Bibasilar dependent air space disease with a tiny right pleural effusion.  Findings may be due to congestive heart failure. Pneumonia is not excluded.   Original Report Authenticated By: Leanna Battles, M.D.    Ct Angio Chest Pe W/cm &/or Wo Cm  09/29/2012  **ADDENDUM** CREATED: 09/29/2012 09:41:00  This report was communicated directly by phone to Toya Smothers, September 29, 2012,  939 AM.  **END ADDENDUM** SIGNED BY: Rutherford Guys. Margarita Grizzle, M.D.   09/29/2012  *RADIOLOGY REPORT*  Clinical Data: No Hodgkin's disease with shortness of breath and hypoxia  CT ANGIOGRAPHY CHEST  Technique:  Multidetector CT imaging  of the chest using the standard protocol during bolus administration of intravenous contrast. Multiplanar reconstructed images including MIPs were obtained and reviewed to evaluate the vascular anatomy.  Contrast: OMNIPAQUE IOHEXOL 350 MG/ML SOLN  Comparison: None.  Findings: There is a focal pulmonary embolus in the right lower lobe pulmonary artery.  No other pulmonary emboli identified. There is no thoracic aortic aneurysm or dissection.  There is widespread alveolar opacity, probably representing ARDS.  There is adenopathy throughout the mediastinum.  The largest individual lymph node is between the ascending aorta and superior vena cava, measuring 2.79 x 1.5 cm.  Somewhat similar lymph nodes are noted just to the left of the left main pulmonary artery. There is a right hilar lymph node measuring 1.8 x 2.0 cm.  There is a subcarinal lymph node measuring 2.3 x 1.6 cm.  Pericardium is not thickened.  The visualized upper abdominal structures appear grossly normal.  There are no blastic or lytic bone lesions.  IMPRESSION: There is a focal pulmonary embolus in the right lower lobe pulmonary artery region.  There is no central pulmonary embolus seen.  Adenopathy consistent with known Hodgkin's disease.  Widespread alveolar pneumonitis.  Suspect ARDS. An allergic type response.   cause this appearance as well.  Widespread infectious pneumonia is a third differential consideration.   Original Report Authenticated By: Bretta Bang, M.D.     Scheduled Meds:    . benzonatate  100 mg Oral TID  . enoxaparin (LOVENOX) injection  90 mg Subcutaneous Q12H  . famotidine  10 mg Oral Daily  . levofloxacin  750 mg Intravenous Q24H  . oxymetazoline  1 spray Each Nare BID  . potassium chloride  20 mEq Oral Daily  . sodium chloride  3 mL Intravenous Q12H  . vancomycin  1,250 mg Intravenous Q8H   Continuous Infusions:    . 0.9 % NaCl with KCl 20 mEq / L 50 mL/hr at 09/29/12 1434    Principal Problem:  *CAP (community acquired pneumonia) Active Problems:  Hodgkin's disease  Hypoxia  Thrombocytopenia  Anemia  Dyspnea  Pleural effusion on right  Pulmonary emboli  Acute respiratory failure with hypoxia    Time spent: 35 minutes    Monica Codd  [facility]  If 8PM-8AM, please contact night-coverage at [email or web address], password [facility] 09/29/2012, 3:15 PM  LOS: 1 day     Attending:  The patient was seen and examined. He was discussed with nurse practitioner, Ms. Vedia Coffer. The above note has been annotated and amended. I discussed the patient's clinical course and management with his wife, his mother, and his sister. Also discussed management with his oncologist Dr. Mariel Sleet. Per my conversation with Dr. Mariel Sleet, we are in agreement to broaden his antibiotic therapy to include Levaquin, vancomycin, and Septra intravenously. Lovenox has been started, but Dr. Mariel Sleet prefers holding off on Coumadin for now. Eventually, he will require treatment with an anticoagulant for at least 6 months. The patient's ABG is somewhat reassuring, although he is still hypoxic. Symptomatic measures have been taken to help relieve his nasal congestion. IV fluids will be titrated down given that he is receiving fluids with multiple antibiotics.      Elliot Cousin, M.D.

## 2012-09-29 NOTE — Progress Notes (Signed)
Subjective: Patient reports that he is feeling better and breathing better.  I personally reviewed and went over radiographic studies with the patient.  Patient noted to have small PE.  Patient's chart reviewed.  Fevers noted while in the hospital.  Objective: Vital signs in last 24 hours: Temp:  [98.5 F (36.9 C)-101.3 F (38.5 C)] 99.4 F (37.4 C) (02/04 1419) Pulse Rate:  [95-107] 95  (02/04 1419) Resp:  [18-20] 18  (02/04 1419) BP: (112-119)/(53-58) 119/53 mmHg (02/04 1419) SpO2:  [78 %-98 %] 97 % (02/04 1419) FiO2 (%):  [44 %] 44 % (02/04 1410)  Intake/Output from previous day: 02/03 0800 - 02/04 0759 In: 2236 [P.O.:720; I.V.:966; IV Piggyback:550] Out: 800 [Urine:800] Intake/Output this shift: Total I/O In: 1182.7 [P.O.:440; I.V.:592.7; IV Piggyback:150] Out: -   General appearance: alert, cooperative, appears stated age, no distress and nasal cannula in place.  Less labored breathing today.  Lab Results:   [facility] 09/29/12 0537 09/28/12 0739  WBC 9.2 7.8  HGB 10.0* 10.1*  HCT 30.6* 30.0*  PLT 159 124*   BMET  Basename 09/29/12 0537 09/28/12 0739  NA 136 135  K 4.3 3.6  CL 104 99  CO2 27 27  GLUCOSE 112* 122*  BUN 8 13  CREATININE 0.98 1.04  CALCIUM 8.8 8.9    Studies/Results: Dg Chest 2 View  09/28/2012  *RADIOLOGY REPORT*  Clinical Data: Shortness of breath.  Hodgkin lymphoma.  CHEST - 2 VIEW  Comparison: PET 09/10/2012 and chest radiograph 06/03/2012.  Findings: Trachea is midline.  Heart size is at the upper limits of normal.  Left subclavian power port tip projects over the SVC. Lungs are somewhat low in volume with bibasilar dependent air space disease.  Tiny right pleural effusion.  IMPRESSION: Bibasilar dependent air space disease with a tiny right pleural effusion.  Findings may be due to congestive heart failure. Pneumonia is not excluded.   Original Report Authenticated By: Leanna Battles, M.D.    Ct Angio Chest Pe W/cm &/or Wo Cm  09/29/2012   **ADDENDUM** CREATED: 09/29/2012 09:41:00  This report was communicated directly by phone to Toya Smothers, September 29, 2012,  939 AM.  **END ADDENDUM** SIGNED BY: Rutherford Guys. Margarita Grizzle, M.D.   09/29/2012  *RADIOLOGY REPORT*  Clinical Data: No Hodgkin's disease with shortness of breath and hypoxia  CT ANGIOGRAPHY CHEST  Technique:  Multidetector CT imaging of the chest using the standard protocol during bolus administration of intravenous contrast. Multiplanar reconstructed images including MIPs were obtained and reviewed to evaluate the vascular anatomy.  Contrast: OMNIPAQUE IOHEXOL 350 MG/ML SOLN  Comparison: None.  Findings: There is a focal pulmonary embolus in the right lower lobe pulmonary artery.  No other pulmonary emboli identified. There is no thoracic aortic aneurysm or dissection.  There is widespread alveolar opacity, probably representing ARDS.  There is adenopathy throughout the mediastinum.  The largest individual lymph node is between the ascending aorta and superior vena cava, measuring 2.79 x 1.5 cm.  Somewhat similar lymph nodes are noted just to the left of the left main pulmonary artery. There is a right hilar lymph node measuring 1.8 x 2.0 cm.  There is a subcarinal lymph node measuring 2.3 x 1.6 cm.  Pericardium is not thickened.  The visualized upper abdominal structures appear grossly normal.  There are no blastic or lytic bone lesions.  IMPRESSION: There is a focal pulmonary embolus in the right lower lobe pulmonary artery region.  There is no central pulmonary embolus seen.  Adenopathy consistent with known Hodgkin's disease.  Widespread alveolar pneumonitis.  Suspect ARDS. An allergic type response.  cause this appearance as well.  Widespread infectious pneumonia is a third differential consideration.   Original Report Authenticated By: Bretta Bang, M.D.     Medications: I have reviewed the patient's current medications.  Assessment/Plan: 1. Hodgkin's Lymphoma Stage IIA,  Nodular Sclerosing, Day 16 of cycle 4 of chemotherapy consisting of Bleomycin, Dacarbazine, Doxorubicin, and Vinblastine D1 and D15 every 28 days.  Complete CR by PET scan criteria. 2. B/L infiltrates in lung, preseumed to be pneumonia. Must keep Bleomycin lung toxicity in the differential. If patient does not improve quickly, would not hesitate to consult Dr. Juanetta Gosling.  Continue antibiotics and supportive care. 3. Small PE noted, patient started on Lovenox.  Patient education regarding PE and the increased risk of thrombus associated with malignancy and associated treatment. 4. Anemia, stable.    LOS: 1 day    KEFALAS,THOMAS 09/29/2012   Day 15 of cycle #4 not given due to this illness.  Am not sure this is not Bleomycin related vs viral-induced ARDS vs bacterial, although I doubt the latter.  The small PE in my opinion is only contributing a small amount to this acute illness with significant x-ray changes.

## 2012-09-29 NOTE — Progress Notes (Signed)
ANTICOAGULATION CONSULT NOTE - Initial Consult  Pharmacy Consult for Lovenox Indication: pulmonary embolus  Allergies  Allergen Reactions  . Amoxicillin Rash    Patient Measurements: Height: 5\' 9"  (175.3 cm) Weight: 204 lb 9.4 oz (92.8 kg) IBW/kg (Calculated) : 70.7   Vital Signs: Temp: 100.8 F (38.2 C) (02/04 0842) Temp src: Oral (02/04 0842) BP: 116/57 mmHg (02/04 0453) Pulse Rate: 101  (02/04 0453)  Labs:  Basename 09/29/12 0537 09/28/12 0830 09/28/12 0739  HGB 10.0* -- 10.1*  HCT 30.6* -- 30.0*  PLT 159 -- 124*  APTT -- -- --  LABPROT -- -- --  INR -- -- --  HEPARINUNFRC -- -- --  CREATININE 0.98 -- 1.04  CKTOTAL -- -- --  CKMB -- -- --  TROPONINI -- <0.30 --    Estimated Creatinine Clearance: 117.2 ml/min (by C-G formula based on Cr of 0.98).   Medical History: Past Medical History  Diagnosis Date  . Mass of left side of neck 04/2012  . 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 Hodgkin's disease   . Hodgkin lymphoma   . Diastolic dysfunction     Grade 2    Medications:  Scheduled:    . benzonatate  100 mg Oral TID  . enoxaparin (LOVENOX) injection  90 mg Subcutaneous Q12H  . famotidine  10 mg Oral Daily  . levofloxacin  750 mg Intravenous Q24H  . potassium chloride  20 mEq Oral Daily  . sodium chloride  3 mL Intravenous Q12H  . vancomycin  1,000 mg Intravenous Q8H  . [COMPLETED] vancomycin  1,000 mg Intravenous Once    Assessment: 37 yo M with new PE to start on full-dose Lovenox.  He is currently receiving chemotherapy for Hodgkin's Lymphoma.  Mild anemia & thrombocytopenia noted. No active bleed.   Goal of Therapy:  Anti-Xa level 0.6-1.2 units/ml 4hrs after LMWH dose given Monitor platelets by anticoagulation protocol: Yes   Plan:  Lovenox 90mg  sq q12h CBC on MWF Follow-up long-term anticoagulation plan  Elson Clan 09/29/2012,10:28 AM

## 2012-09-30 ENCOUNTER — Inpatient Hospital Stay (HOSPITAL_COMMUNITY): Payer: PRIVATE HEALTH INSURANCE

## 2012-09-30 ENCOUNTER — Other Ambulatory Visit (HOSPITAL_COMMUNITY): Payer: Self-pay | Admitting: Oncology

## 2012-09-30 ENCOUNTER — Encounter (HOSPITAL_COMMUNITY): Payer: Self-pay

## 2012-09-30 DIAGNOSIS — R739 Hyperglycemia, unspecified: Secondary | ICD-10-CM | POA: Diagnosis present

## 2012-09-30 DIAGNOSIS — R0989 Other specified symptoms and signs involving the circulatory and respiratory systems: Secondary | ICD-10-CM

## 2012-09-30 DIAGNOSIS — J189 Pneumonia, unspecified organism: Secondary | ICD-10-CM

## 2012-09-30 DIAGNOSIS — Z87891 Personal history of nicotine dependence: Secondary | ICD-10-CM

## 2012-09-30 DIAGNOSIS — D649 Anemia, unspecified: Secondary | ICD-10-CM

## 2012-09-30 DIAGNOSIS — T50905A Adverse effect of unspecified drugs, medicaments and biological substances, initial encounter: Secondary | ICD-10-CM | POA: Diagnosis present

## 2012-09-30 DIAGNOSIS — R635 Abnormal weight gain: Secondary | ICD-10-CM | POA: Diagnosis present

## 2012-09-30 DIAGNOSIS — R0609 Other forms of dyspnea: Secondary | ICD-10-CM

## 2012-09-30 DIAGNOSIS — I2699 Other pulmonary embolism without acute cor pulmonale: Secondary | ICD-10-CM

## 2012-09-30 LAB — COMPREHENSIVE METABOLIC PANEL
ALT: 15 U/L (ref 0–53)
AST: 25 U/L (ref 0–37)
Alkaline Phosphatase: 61 U/L (ref 39–117)
Calcium: 8.8 mg/dL (ref 8.4–10.5)
Potassium: 4.1 mEq/L (ref 3.5–5.1)
Sodium: 134 mEq/L — ABNORMAL LOW (ref 135–145)
Total Protein: 6.1 g/dL (ref 6.0–8.3)

## 2012-09-30 LAB — CBC WITH DIFFERENTIAL/PLATELET
Basophils Absolute: 0 10*3/uL (ref 0.0–0.1)
Basophils Relative: 0 % (ref 0–1)
Eosinophils Relative: 0 % (ref 0–5)
HCT: 28.6 % — ABNORMAL LOW (ref 39.0–52.0)
MCHC: 33.6 g/dL (ref 30.0–36.0)
MCV: 82.2 fL (ref 78.0–100.0)
Monocytes Absolute: 0.1 10*3/uL (ref 0.1–1.0)
Monocytes Relative: 2 % — ABNORMAL LOW (ref 3–12)
RDW: 18.1 % — ABNORMAL HIGH (ref 11.5–15.5)

## 2012-09-30 LAB — BLOOD GAS, ARTERIAL
Acid-base deficit: 1.4 mmol/L (ref 0.0–2.0)
Acid-base deficit: 0.6 mmol/L (ref 0.0–2.0)
Delivery systems: POSITIVE
Drawn by: 21694
Drawn by: 205171
FIO2: 1 %
Inspiratory PAP: 12
O2 Content: 100 L/min
O2 Saturation: 91.9 %
Patient temperature: 97.9
TCO2: 20 mmol/L (ref 0–100)
pCO2 arterial: 33.4 mmHg — ABNORMAL LOW (ref 35.0–45.0)
pH, Arterial: 7.45 (ref 7.350–7.450)
pO2, Arterial: 64.1 mmHg — ABNORMAL LOW (ref 80.0–100.0)

## 2012-09-30 LAB — CBC
Hemoglobin: 9.8 g/dL — ABNORMAL LOW (ref 13.0–17.0)
MCH: 27.8 pg (ref 26.0–34.0)
MCHC: 33.2 g/dL (ref 30.0–36.0)
Platelets: 183 10*3/uL (ref 150–400)

## 2012-09-30 LAB — MRSA PCR SCREENING: MRSA by PCR: NEGATIVE

## 2012-09-30 LAB — PRO B NATRIURETIC PEPTIDE: Pro B Natriuretic peptide (BNP): 82.6 pg/mL (ref 0–125)

## 2012-09-30 LAB — GLUCOSE, CAPILLARY
Glucose-Capillary: 175 mg/dL — ABNORMAL HIGH (ref 70–99)
Glucose-Capillary: 112 mg/dL — ABNORMAL HIGH (ref 70–99)

## 2012-09-30 LAB — PROCALCITONIN: Procalcitonin: 0.51 ng/mL

## 2012-09-30 MED ORDER — ALBUTEROL SULFATE (5 MG/ML) 0.5% IN NEBU
2.5000 mg | INHALATION_SOLUTION | RESPIRATORY_TRACT | Status: DC
  Administered 2012-09-30 – 2012-10-05 (×24): 2.5 mg via RESPIRATORY_TRACT
  Filled 2012-09-30 (×27): qty 0.5

## 2012-09-30 MED ORDER — SODIUM CHLORIDE 0.9 % IJ SOLN
10.0000 mL | Freq: Two times a day (BID) | INTRAMUSCULAR | Status: DC
  Administered 2012-10-01 – 2012-10-06 (×3): 10 mL

## 2012-09-30 MED ORDER — METHYLPREDNISOLONE SODIUM SUCC 125 MG IJ SOLR
125.0000 mg | Freq: Four times a day (QID) | INTRAMUSCULAR | Status: DC
  Administered 2012-09-30: 125 mg via INTRAVENOUS
  Filled 2012-09-30 (×5): qty 2

## 2012-09-30 MED ORDER — METHYLPREDNISOLONE SODIUM SUCC 125 MG IJ SOLR
80.0000 mg | Freq: Three times a day (TID) | INTRAMUSCULAR | Status: DC
  Administered 2012-09-30 – 2012-10-03 (×9): 80 mg via INTRAVENOUS
  Filled 2012-09-30 (×12): qty 1.28

## 2012-09-30 MED ORDER — OSELTAMIVIR PHOSPHATE 75 MG PO CAPS
75.0000 mg | ORAL_CAPSULE | Freq: Two times a day (BID) | ORAL | Status: DC
  Administered 2012-09-30 – 2012-10-02 (×5): 75 mg via ORAL
  Filled 2012-09-30 (×6): qty 1

## 2012-09-30 MED ORDER — SODIUM CHLORIDE 0.9 % IV SOLN: 250.0000 mL | INTRAVENOUS | Status: DC | PRN

## 2012-09-30 MED ORDER — INSULIN ASPART 100 UNIT/ML ~~LOC~~ SOLN
0.0000 [IU] | SUBCUTANEOUS | Status: DC
  Administered 2012-09-30 – 2012-10-01 (×2): 2 [IU] via SUBCUTANEOUS
  Administered 2012-10-01: 1 [IU] via SUBCUTANEOUS
  Administered 2012-10-01: 2 [IU] via SUBCUTANEOUS
  Administered 2012-10-01 – 2012-10-02 (×6): 1 [IU] via SUBCUTANEOUS
  Administered 2012-10-03 (×3): 2 [IU] via SUBCUTANEOUS
  Administered 2012-10-03 (×3): 1 [IU] via SUBCUTANEOUS
  Administered 2012-10-03 – 2012-10-04 (×2): 2 [IU] via SUBCUTANEOUS
  Administered 2012-10-04: 3 [IU] via SUBCUTANEOUS
  Administered 2012-10-04 (×3): 2 [IU] via SUBCUTANEOUS
  Administered 2012-10-05: 1 [IU] via SUBCUTANEOUS
  Administered 2012-10-05: 2 [IU] via SUBCUTANEOUS

## 2012-09-30 MED ORDER — CHLORHEXIDINE GLUCONATE 0.12 % MT SOLN
15.0000 mL | Freq: Two times a day (BID) | OROMUCOSAL | Status: DC
  Administered 2012-09-30 – 2012-10-06 (×11): 15 mL via OROMUCOSAL
  Filled 2012-09-30 (×13): qty 15

## 2012-09-30 MED ORDER — HEPARIN (PORCINE) IN NACL 100-0.45 UNIT/ML-% IJ SOLN
1650.0000 [IU]/h | INTRAMUSCULAR | Status: DC
  Administered 2012-09-30 – 2012-10-01 (×2): 1350 [IU]/h via INTRAVENOUS
  Administered 2012-10-02 (×2): 1650 [IU]/h via INTRAVENOUS
  Filled 2012-09-30 (×5): qty 250

## 2012-09-30 MED ORDER — SODIUM CHLORIDE 0.9 % IJ SOLN
10.0000 mL | INTRAMUSCULAR | Status: DC | PRN
  Administered 2012-10-06: 10 mL

## 2012-09-30 MED ORDER — BIOTENE DRY MOUTH MT LIQD
15.0000 mL | Freq: Two times a day (BID) | OROMUCOSAL | Status: DC
  Administered 2012-09-30 – 2012-10-06 (×11): 15 mL via OROMUCOSAL

## 2012-09-30 MED ORDER — SODIUM CHLORIDE 0.9 % IV SOLN
500.0000 mg | Freq: Four times a day (QID) | INTRAVENOUS | Status: DC
  Administered 2012-09-30 – 2012-10-02 (×9): 500 mg via INTRAVENOUS
  Filled 2012-09-30 (×10): qty 500

## 2012-09-30 MED ORDER — PANTOPRAZOLE SODIUM 40 MG IV SOLR
40.0000 mg | Freq: Every day | INTRAVENOUS | Status: DC
  Administered 2012-09-30 – 2012-10-02 (×3): 40 mg via INTRAVENOUS
  Filled 2012-09-30 (×4): qty 40

## 2012-09-30 NOTE — Consult Note (Signed)
[facility]    Date of Admission:  09/28/2012           Day 3 vancomycin        Day 3 levofloxacin        Day 2 trimethoprim sulfamethoxazole        Day 1 imipenem        Day 1 oseltamivir  Reason for Consult: Fever, diffuse pulmonary infiltrates and hypoxia in the setting of lymphoma chemotherapy    Referring Physician: Dr. Micah Flesher  Principal Problem:  *HCAP (healthcare-associated pneumonia) Active Problems:  Acute respiratory failure with hypoxia  Hodgkin's disease  Thrombocytopenia  Anemia  Pleural effusion on right  Pulmonary emboli  Hyperglycemia  Former cigarette smoker  Weight gain due to medication      . albuterol  2.5 mg Nebulization Q4H WA  . antiseptic oral rinse  15 mL Mouth Rinse q12n4p  . benzonatate  100 mg Oral TID  . chlorhexidine  15 mL Mouth Rinse BID  . imipenem-cilastatin  500 mg Intravenous Q6H  . insulin aspart  0-9 Units Subcutaneous Q4H  . levofloxacin  750 mg Intravenous Q24H  . methylPREDNISolone (SOLU-MEDROL) injection  80 mg Intravenous Q8H  . oseltamivir  75 mg Oral BID  . pantoprazole (PROTONIX) IV  40 mg Intravenous QHS  . sulfamethoxazole-trimethoprim  320 mg Intravenous Q6H  . vancomycin  1,250 mg Intravenous Q8H    Recommendations: 1. Continue current antimicrobial therapy 2. Agree with droplet isolation and PCR for influenza 3. Attempted to obtain expectorated sputum for analyses 4. Serum LDH   Assessment: I would assume that Mr. Lanum has acute healthcare associated pneumonia given his recent history and current clinical findings. I agree with broad empiric therapy including treatment for possible influenza and common bacterial pathogens. He has extensive bilateral infiltrates. I suspect that if this were do to pneumocystis he would be much sicker, but with recent chemotherapy and steroid therapy he is certainly at risk for pneumocystis. Also he appears to have not been on any prophylaxis so I  will continue empiric trimethoprim sulfamethoxazole for now. I will obtain a serum LDH and consider stopping trimethoprim sulfamethoxazole if it is normal. It is possible that his acute illness is due to interstitial lung injury or hypersensitivity pneumonitis do to bleomycin therapy, but this is a diagnosis of exclusion.   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 He is currently in his fourth cycle of chemotherapy with doxyrubicin, vinblastine, dacarbazine, bleomycin and dexamethasone. He receives chemotherapy every 2 weeks through his [facility]-A-[facility]. He had to miss one cycle of his chemotherapy early 1-2 cytopenias but does not recall any episodes of fever infection during his treatment. When reevaluated late last month he was felt to be in complete remission. He has not started radiation therapy yet.  About 5-6 days ago he began to feel extremely weak, started having low-grade fevers, dry cough and shortness of breath. Prior to that he had not noticed any shortness of breath or other respiratory symptoms and was able to continue his regular work routine. He was admitted to [facility] on February 3. He had fever, hypoxia and diffuse bilateral pulmonary infiltrates. He was unable to produce any sputum for examination. Blood cultures have been negative. He was treated with empiric vancomycin and levofloxacin but had worsening hypoxia. Trimethoprim sulfamethoxazole was added last night and he was  transferred here today. He says that he is feeling much better now than he was when he went into the hospital. His wife, who is at the bedside, agrees that he is doing better now.  He does not believe that he has ever received an influenza or pneumococcal vaccine. He recalls being told that he had pneumonia as a young child. He has had no unusual travel or animal exposure. He has no known history of  exposure to tuberculosis and has never been tested. His 2 children have recently had upper respiratory infections of unknown cause. He knows of no other sick contacts. He does not believe that he was receiving prophylactic therapy for pneumocystis during his chemotherapy.  He states his gained about 30 pounds unintentionally during his chemotherapy, presumably due to the steroids. He underwent a routine dental cleaning last week. He quit smoking cigarettes about 7 months ago.   Review of Systems: Pertinent items are noted in HPI.  Past Medical History  Diagnosis Date  . Mass of left side of neck 04/2012  . 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 Hodgkin's disease   . Hodgkin lymphoma   . Diastolic dysfunction     Grade 2  . Acute respiratory failure with hypoxia 09/29/2012  . Pulmonary emboli 09/29/2012    History  Substance Use Topics  . Smoking status: Former Smoker -- 0.1 packs/day for 10 years    Types: Cigarettes    Quit date: 02/26/2012  . Smokeless tobacco: Never Used  . Alcohol Use: No    No family history on file. Allergies  Allergen Reactions  . Amoxicillin Rash    OBJECTIVE: Blood pressure 108/58, pulse 68, temperature 98.2 F (36.8 C), temperature source Oral, resp. rate 27, height 5\' 6"  (1.676 m), weight 90.8 kg (200 lb 2.8 oz), SpO2 99.00%. General: He is pale, alert and relatively comfortable. He appears to limit conversation so as not to worsen his respiratory symptoms. He has no increased work of breathing. Skin: No rash, splinter or conjunctival hemorrhages Oral: He has one small scab on his lower lip where he said he had an outbreak of fever blisters last week. He has no thrush or other oropharyngeal lesions Lungs: Clear anteriorly Cor: Regular S1 and S2 no murmurs or gallops heard Abdomen: Soft and nontender Extremities: No edema or other acute abnormalities  Microbiology: Recent Results (from the past 240 hour(s))    CULTURE, BLOOD (ROUTINE X 2)     Status: Normal (Preliminary result)   Collection Time   09/28/12  8:48 AM      Component Value Range Status Comment   Specimen Description BLOOD LEFT ANTECUBITAL   Final    Special Requests     Final    Value: BOTTLES DRAWN AEROBIC AND ANAEROBIC AEB=8CC ANA=6CC   Culture NO GROWTH 2 DAYS   Final    Report Status PENDING   Incomplete   CULTURE, BLOOD (ROUTINE X 2)     Status: Normal (Preliminary result)   Collection Time   09/28/12  8:51 AM      Component Value Range Status Comment   Specimen Description BLOOD LEFT ANTECUBITAL   Final    Special Requests BOTTLES DRAWN AEROBIC AND ANAEROBIC 6CC   Final    Culture NO GROWTH 2 DAYS   Final    Report Status PENDING   Incomplete   MRSA PCR SCREENING     Status: Normal   Collection Time  09/30/12  4:30 AM      Component Value Range Status Comment   MRSA by PCR NEGATIVE  NEGATIVE Final     [name], MD [facility] [phone] pager   [phone] cell 09/30/2012, 3:30 PM

## 2012-09-30 NOTE — Progress Notes (Signed)
ANTICOAGULATION & ANTIBIOTIC CONSULT NOTE - Follow Up Consult  Pharmacy Consult for Heparin Indication: pulmonary embolus Pharmacy Consult for Primaxin, Vancomycin, Septra, Levaquin Indication: rule out pneumonia and rule out sepsis  Allergies  Allergen Reactions  . Amoxicillin Rash    Patient Measurements: Height: 5\' 6"  (167.6 cm) Weight: 200 lb 2.8 oz (90.8 kg) IBW/kg (Calculated) : 63.8  Heparin Dosing Weight: 83 Kg  Vital Signs: Temp: 98.2 F (36.8 C) (02/05 0801) Temp src: Oral (02/05 0801) BP: 123/67 mmHg (02/05 1200) Pulse Rate: 89  (02/05 1200)  Labs:  Basename 09/30/12 0506 09/29/12 0537 09/28/12 0830 09/28/12 0739  HGB 9.8* 10.0* -- --  HCT 29.5* 30.6* -- 30.0*  PLT 183 159 -- 124*  APTT -- -- -- --  LABPROT -- -- -- --  INR -- -- -- --  HEPARINUNFRC -- -- -- --  CREATININE -- 0.98 -- 1.04  CKTOTAL -- -- -- --  CKMB -- -- -- --  TROPONINI -- -- <0.30 --    Estimated Creatinine Clearance: 110 ml/min (by C-G formula based on Cr of 0.98).   Medications:  Prescriptions prior to admission  Medication Sig Dispense Refill  . DACARBAZINE IV Inject into the vein every 28 (twenty-eight) days. Days 1, 15 every 28 days      . dexamethasone (DECADRON) 4 MG tablet Take 8 mg by mouth. Starting the day after chemo, take 2 tablets in the am and 2 tablets in the pm for 3 days.      Marland Kitchen DOXOrubicin HCl (ADRIAMYCIN IV) Inject into the vein every 28 (twenty-eight) days. Days 1, 15 every 28 days      . lidocaine-prilocaine (EMLA) cream Apply topically as needed. Apply a quarter sized amount to port site 1 hour prior to chemo. Do not rub in. Cover with plastic.      . Multiple Vitamin (MULTIVITAMIN) tablet Take 1 tablet by mouth daily.      . ondansetron (ZOFRAN) 8 MG tablet Take 8 mg by mouth. Starting the day after chemo, take 1 tablet in the am and 1 tablet in the pm for 3 days. Then may take 1 tablet two times a day IF needed for nausea/vomiting. Side Effect: constipation       . potassium chloride SA (K-DUR,KLOR-CON) 20 MEQ tablet Take 20 mEq by mouth daily.      . Ranitidine HCl (ZANTAC PO) Take 1 capsule by mouth as needed.      . simethicone (MYLICON) 80 MG chewable tablet Chew 80 mg by mouth every 6 (six) hours as needed. For gas      . sodium chloride 0.9 % SOLN 50 mL with bleomycin 30 UNITS SOLR Inject into the vein every 28 (twenty-eight) days. Days 1,15 every 28 days  (do not remove this medication from patient's profile even when he is done with chemo)!!! Medical staff need to know that this patient has received this drug if he is to undergo anesthesia (drug starts on 06/09/12)!!!!      . sodium chloride 0.9 % SOLN 50 mL with vinBLAStine 1 MG/ML SOLN Inject into the vein every 28 (twenty-eight) days. Days 1, 15 every 28 days          Labs:  [facility] 09/30/12 0506 09/29/12 0537 09/28/12 0739  WBC 13.7* 9.2 7.8  HGB 9.8* 10.0* 10.1*  PLT 183 159 124*  LABCREA -- -- --  CREATININE -- 0.98 1.04   Estimated Creatinine Clearance: 110 ml/min (by C-G formula based on Cr of  0.98).  Basename 09/29/12 0848  VANCOTROUGH 13.1  VANCOPEAK --  VANCORANDOM --  GENTTROUGH --  GENTPEAK --  GENTRANDOM --  TOBRATROUGH --  TOBRAPEAK --  TOBRARND --  AMIKACINPEAK --  AMIKACINTROU --  AMIKACIN --     Microbiology: Recent Results (from the past 720 hour(s))  CULTURE, BLOOD (ROUTINE X 2)     Status: Normal (Preliminary result)   Collection Time   09/28/12  8:48 AM      Component Value Range Status Comment   Specimen Description BLOOD LEFT ANTECUBITAL   Final    Special Requests     Final    Value: BOTTLES DRAWN AEROBIC AND ANAEROBIC AEB=8CC ANA=6CC   Culture NO GROWTH 2 DAYS   Final    Report Status PENDING   Incomplete   CULTURE, BLOOD (ROUTINE X 2)     Status: Normal (Preliminary result)   Collection Time   09/28/12  8:51 AM      Component Value Range Status Comment   Specimen Description BLOOD LEFT ANTECUBITAL   Final    Special Requests BOTTLES DRAWN  AEROBIC AND ANAEROBIC 6CC   Final    Culture NO GROWTH 2 DAYS   Final    Report Status PENDING   Incomplete   MRSA PCR SCREENING     Status: Normal   Collection Time   09/30/12  4:30 AM      Component Value Range Status Comment   MRSA by PCR NEGATIVE  NEGATIVE Final     Anti-infectives     Start     Dose/Rate Route Frequency Ordered Stop   09/30/12 1300   imipenem-cilastatin (PRIMAXIN) 500 mg in sodium chloride 0.9 % 100 mL IVPB        500 mg 200 mL/hr over 30 Minutes Intravenous 4 times per day 09/30/12 1244     09/30/12 1215   oseltamivir (TAMIFLU) capsule 75 mg        75 mg Oral 2 times daily 09/30/12 1215 10/05/12 0959   09/29/12 1800   vancomycin (VANCOCIN) 1,250 mg in sodium chloride 0.9 % 250 mL IVPB        1,250 mg 166.7 mL/hr over 90 Minutes Intravenous Every 8 hours 09/29/12 1321     09/29/12 1800  sulfamethoxazole-trimethoprim (BACTRIM) 320 mg in dextrose 5 % 500 mL IVPB       320 mg 346.7 mL/hr over 90 Minutes Intravenous 4 times per day 09/29/12 1559     09/28/12 1800   vancomycin (VANCOCIN) IVPB 1000 mg/200 mL premix  Status:  Discontinued        1,000 mg 200 mL/hr over 60 Minutes Intravenous Every 8 hours 09/28/12 1121 09/29/12 1321   09/28/12 0930   vancomycin (VANCOCIN) IVPB 1000 mg/200 mL premix  Status:  Discontinued        1,000 mg 200 mL/hr over 60 Minutes Intravenous  Once 09/28/12 0924 09/28/12 1121   09/28/12 0915   levofloxacin (LEVAQUIN) IVPB 750 mg        750 mg 100 mL/hr over 90 Minutes Intravenous Every 24 hours 09/28/12 0908            Assessment: Samuel [name] was transferred from [facility] for CCM management. He was admitted there for 3-d hx of SOB and weakness. Notably, he has Hodgkin's Lymphoma and is currently undergoing chemotherapy Q 28 days. He was  Due for chemo today. At [facility], he was initiated on broad spectrum abx given immune suppression, including TMP/SMX for suspected PCP.  His Tm was 102, WBC are elevated at 13.7. Scr is normal, but UOP has  been low (0.4). Noted Vanc dose was adjusted to keep trough within goal. Received orders to add Primaxin to double cover GNR including pseudomonal coverage. Additionally at [facility], he was initiated on full dose LMWH for RLL PE found on chest CT on 09/29/12. Noted he received a 90mg  dose this morning at 9 AM. Noted H/H decreased slightly, Plts are stable. No overt bleeding is reported.  Abx: 2/3: Vanc>> 2/3: Levaquin>> 2/4: TMP/SMX>> 2/5: Primaxin>>  2/3: Resp virus panel: 2/3: Blood: ngtd 2/5: MRSA PCR: neg  Goal of Therapy:  Vancomycin trough level 15-20 mcg/ml Heparin level 0.3-0.7 units/ml Monitor platelets by anticoagulation protocol: Yes  Plan:  - Continue Vancomycin 1250mg  IV q 8h- will plan to recheck trough in 1-2 days - Continue Levaquin 750mg  IV q 24h - Continue TMP/SMX at current dose (~ 15 mg/kg/day) - Add Primaxin 500mg  IV q 6h, start ASAP - At 6pm this evening, will start IV heparin infusion at 1350 units/hr. - At [place], check heparin level. - Daily heparin level and CBC - Will monitor cx/spec/sens, renal fn and clinical status daily.   Thanks, Hailea Eaglin K. Allena Katz, PharmD, BCPS.  Clinical Pharmacist Pager [phone]. 09/30/2012 1:50 PM

## 2012-09-30 NOTE — Consult Note (Signed)
PULMONARY  / CRITICAL CARE MEDICINE  Name: Samuel [name] MRN: [id number] DOB: Oct 21, 1975    ADMISSION DATE:  09/28/2012 CONSULTATION DATE:  09/30/12  REFERRING MD :  Elliot Cousin  CHIEF COMPLAINT:  Respiratory distress  BRIEF PATIENT DESCRIPTION: 37 YoM  With a history of hodgkin lymphoma and is currently undergoing chemotherapy presented to 2/3 to [facility] ED with a 3 day history of fever/chills, and weakness and dyspnea. Dyspnea continued to worsened with increased hypoxia, CXR showed diffuse & progressive bilateral infiltrates, also found to have a sm. RLL PE. Condition continued to deteriorate. Transferred to [facility] 2/5, is in acute respiratory failure.   SIGNIFICANT EVENTS / STUDIES:  2/5 - Transferred to [facility] MICU 2/5 LEUS: Right popliteal DVT  LINES / TUBES: Power Port - Left Chest Wall  CULTURES: Blood CX 2/3 >>> resp viral panel  2/5 >>> CMV 2/5>>> EPV 2/5>>> RSV 2/5>>> Ustrep 2/5>>> u legionella 2/5>>> MRSA 2/5 - NEG  ANTIBIOTICS: Bactrim 2/4 >>> Vanc 2/5 >>> Imipenem 2/5 >>> levaquin 2/5 >>> Tamilflu 2/5 >>>  HISTORY OF PRESENT ILLNESS: 37 y.o. male with pmhx including Hodgin lymphoma and is currently undergoing chemotherapy every 28 days (due today) who presents to [place] ED with CC weakness, sob. Pt reports beginning to "feel bad" on 1/31. States at that time he felt generalized weakness with chills and fever 100.8. On 2/1 developed headache and worsening weakness. Denied CP/palpitation, nausea/vomiting, diarrhea, constipation. Denied dizziness, lightheadedness, visual disturbances. 2/2 developed worsening sob with exertions and dry cough. 3/3 felt no better so went to [facility] ED. Symptoms came on suddenly have persisted and worsened. Was diagnosed with pneumonia and admitted. He was found to havediffuse & progressive bilateral infiltrates and a PE, treated with bactrim and Lovenox, respectively. 2/4 his bilateral infiltrates worsened with questionable ARDS and he  had a temp of 102F. He was started on BiPAP and steroids with concern for allergic alveolitis from bleomycin. 2/5 He was transferred to [facility] for the possible need of bronchoscopic evaluation.   PAST MEDICAL HISTORY :  Past Medical History  Diagnosis Date  . Mass of left side of neck 04/2012  . 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 Hodgkin's disease   . Hodgkin lymphoma   . Diastolic dysfunction     Grade 2  . Acute respiratory failure with hypoxia 09/29/2012  . Pulmonary emboli 09/29/2012   Past Surgical History  Procedure Date  . Eye surgery as a child  . Mass biopsy 05/05/2012    Procedure: NECK MASS BIOPSY;  Surgeon: [name], MD;  Location: [facility];  Service: ENT;  Laterality: Left;  Excisional biopsy of left neck mass   . Portacath placement 06/03/2012    Procedure: INSERTION PORT-A-CATH;  Surgeon: [name], MD;  Location: [facility];  Service: General;  Laterality: Left;  Insertion of Port-A-Cath Left Subclavian  . Bone marrow biopsy   . Bone marrow aspiration    Prior to Admission medications   Medication Sig Start Date End Date Taking? Authorizing Provider  DACARBAZINE IV Inject into the vein every 28 (twenty-eight) days. Days 1, 15 every 28 days   Yes Historical Provider, MD  dexamethasone (DECADRON) 4 MG tablet Take 8 mg by mouth. Starting the day after chemo, take 2 tablets in the am and 2 tablets in the pm for 3 days.   Yes Historical Provider, MD  DOXOrubicin HCl (ADRIAMYCIN IV) Inject  into the vein every 28 (twenty-eight) days. Days 1, 15 every 28 days   Yes Historical Provider, MD  lidocaine-prilocaine (EMLA) cream Apply topically as needed. Apply a quarter sized amount to port site 1 hour prior to chemo. Do not rub in. Cover with plastic.   Yes Historical Provider, MD  Multiple Vitamin (MULTIVITAMIN) tablet Take 1 tablet by mouth daily.   Yes Historical Provider, MD  ondansetron (ZOFRAN) 8 MG tablet Take 8 mg by mouth.  Starting the day after chemo, take 1 tablet in the am and 1 tablet in the pm for 3 days. Then may take 1 tablet two times a day IF needed for nausea/vomiting. Side Effect: constipation   Yes Historical Provider, MD  potassium chloride SA (K-DUR,KLOR-CON) 20 MEQ tablet Take 20 mEq by mouth daily.   Yes Historical Provider, MD  Ranitidine HCl (ZANTAC PO) Take 1 capsule by mouth as needed.   Yes Historical Provider, MD  simethicone (MYLICON) 80 MG chewable tablet Chew 80 mg by mouth every 6 (six) hours as needed. For gas   Yes Historical Provider, MD  sodium chloride 0.9 % SOLN 50 mL with bleomycin 30 UNITS SOLR Inject into the vein every 28 (twenty-eight) days. Days 1,15 every 28 days  (do not remove this medication from patient's profile even when he is done with chemo)!!! Medical staff need to know that this patient has received this drug if he is to undergo anesthesia (drug starts on 06/09/12)!!!!   Yes Historical Provider, MD  sodium chloride 0.9 % SOLN 50 mL with vinBLAStine 1 MG/ML SOLN Inject into the vein every 28 (twenty-eight) days. Days 1, 15 every 28 days   Yes Historical Provider, MD   Allergies  Allergen Reactions  . Amoxicillin Rash    FAMILY HISTORY:  No family history on file. SOCIAL HISTORY:  reports that he quit smoking about 7 months ago. His smoking use included Cigarettes. He has a 1 pack-year smoking history. He has never used smokeless tobacco. He reports that he does not drink alcohol or use illicit drugs.  REVIEW OF SYSTEMS:    General: +Fever, Denies weight loss. HEENT: Denies visual changes Cardio: Denies Chest pain, palpitations.  Pulmonary: +dyspnea Abdominal: Denies pain, melena, BRBPR Skin: Denies rash/lesions   SUBJECTIVE:  Feels comfortable on NIPPV VITAL SIGNS: Temp:  [97.4 F (36.3 C)-102 F (38.9 C)] 98.2 F (36.8 C) (02/05 0801) Pulse Rate:  [64-103] 89  (02/05 1200) Resp:  [18-31] 28  (02/05 1200) BP: (90-178)/(46-67) 123/67 mmHg (02/05  1200) SpO2:  [86 %-100 %] 100 % (02/05 1200) FiO2 (%):  [44 %-100 %] 100 % (02/05 1200) Weight:  [90.8 kg (200 lb 2.8 oz)-91.8 kg (202 lb 6.1 oz)] 90.8 kg (200 lb 2.8 oz) (02/05 1140) HEMODYNAMICS:   VENTILATOR SETTINGS: Vent Mode:  [-]  FiO2 (%):  [44 %-100 %] 100 % INTAKE / OUTPUT: Intake/Output      02/04 0701 - 02/05 0700 02/05 0701 - 02/06 0700   P.O. 680    I.V. (mL/kg) 1354.5 (14.8) 20 (0.2)   IV Piggyback 1440 150   Total Intake(mL/kg) 3474.5 (37.8) 170 (1.9)   Urine (mL/kg/hr) 900 (0.4) 1175 (2.2)   Total Output 900 1175   Net +2574.5 -1005        Urine Occurrence 4 x      PHYSICAL EXAMINATION: General:  Ill appearing, male on NIPPV with mild respiratory distress.  Neuro: Awake alert and oriented x4.  HEENT: [place], PERRL Cardiovascular:  S1, S2  intact, no rubs, gallops, murmurs.  Lungs: Occasional bilateral rales.  Abdomen:  Soft, non-tender, non-distended Musculoskeletal: Full ROM Skin:  Intact  LABS:  Lab 09/30/12 1225 09/30/12 0506 09/30/12 0258 09/29/12 1314 09/29/12 0537 09/28/12 0830 09/28/12 0739  HGB -- 9.8* -- -- 10.0* -- 10.1*  WBC -- 13.7* -- -- 9.2 -- 7.8  PLT -- 183 -- -- 159 -- 124*  NA -- -- -- -- 136 -- 135  K -- -- -- -- 4.3 -- 3.6  CL -- -- -- -- 104 -- 99  CO2 -- -- -- -- 27 -- 27  GLUCOSE -- -- -- -- 112* -- 122*  BUN -- -- -- -- 8 -- 13  CREATININE -- -- -- -- 0.98 -- 1.04  CALCIUM -- -- -- -- 8.8 -- 8.9  MG -- -- -- -- -- -- --  PHOS -- -- -- -- -- -- --  AST -- -- -- -- 24 -- 25  ALT -- -- -- -- 20 -- 24  ALKPHOS -- -- -- -- 62 -- 64  BILITOT -- -- -- -- 0.5 -- 0.7  PROT -- -- -- -- 5.8* -- 6.3  ALBUMIN -- -- -- -- 2.7* -- 3.2*  APTT -- -- -- -- -- -- --  INR -- -- -- -- -- -- --  LATICACIDVEN -- -- -- -- -- -- --  TROPONINI -- -- -- -- -- <0.30 --  PROCALCITON -- -- -- -- -- -- --  PROBNP -- 41.5 -- -- -- 8.3 --  O2SATVEN -- -- -- -- -- -- --  PHART 7.450 -- 7.390 7.427 -- -- --  PCO2ART 33.4* -- 38.6 37.3 -- -- --  PO2ART  316.0* -- 64.1* 72.4* -- -- --    Lab 09/30/12 1137  GLUCAP 136*     CXR: Diffuse airspace and interstitial infiltration throughout both lungs. Infiltrative changes demonstrates some progression since the previous study. Central venous catheter is unchanged.  ASSESSMENT / PLAN:  PULMONARY A: 1. Acute Respiratory Failure in setting diffuse pulmonary infiltrates. likely infectious,? PNA, ? Influenza ? ARDS ? Drug related pneumonitis.  2. PE  Seems to be tolerating NIPPV. Have asked ID to see re: keeping bactrim, although don't think this is PCP. Have sent extensive viral work-up. Will cont broad spec abx, and NIPPV. If he worsens and requires ETT will need FOB.  P:   1. Continue NIPPV - BiPAP 4 hours on, 30 mins off.  2. Daily CXR, ABG 3. Diurese with lasix as tolerated.  4. Decrease solumedrol to 80 mg q8, remain empiric 5. See ID  6. Continue anticoagulation , see heme -if ett then immediate BAL bronch  CARDIOVASCULAR A: Diastolic Dysfunction P:  -Strict I & O -Supportive Care -may need echo assessment  RENAL A:  ARDS P:   -Supportive Care, keep dry, appears can tolerate neg balance  GASTROINTESTINAL A:  distress P:   -Supportive Care -ppi -LFT  HEMATOLOGIC A: 1. PE/ RLL DVT 2. Chronic Anemia: Likely related to chemotherapy 3. hodgkin lymphoma and is currently undergoing chemotherapy.  P:  - heparin gtt, as may need procedures, if no procedures, then would like Lovenox in setting malignancy - Daily Coags - Daily CBC and transfuse for hgb < 7 Doppler legs  INFECTIOUS A:  CAP (NOS): Worsening bilateral infiltrates on CXR with acute respiratory failure.  P:   -Empiric antibiotic coverage including atypical coverage due to immunocompromised state.  -Start: Imipenem, Vancomycin, Levaquin -Start Tamiflu -ID  involved. Will defer cont septra to them   ENDOCRINE A:  At risk hyperglycemia P:   Supportive Care Add ssi  NEUROLOGIC A:  No Acute Issue P:    -Supportive Care -may need fent prn for NIMV   Updated wife and patient  TODAY'S SUMMARY:   I have personally obtained a history, examined the patient, evaluated laboratory and imaging results, formulated the assessment and plan and placed orders. CRITICAL CARE: The patient is critically ill with multiple organ systems failure and requires high complexity decision making for assessment and support, frequent evaluation and titration of therapies, application of advanced monitoring technologies and extensive interpretation of multiple databases. Critical Care Time devoted to patient care services described in this note is 40 minutes.    Joneen Roach NP-S  Pulmonary and Critical Care Medicine [facility] Pager: [phone]  09/30/2012, 12:50 PM  Mcarthur Rossetti. Tyson Alias, MD, FACP Pgr: [phone] [place] [facility]

## 2012-09-30 NOTE — Progress Notes (Signed)
Right:  DVT noted in the popliteal vein.  No evidence of superficial thrombosis.  No Baker's cyst.  Left:  No evidence of DVT, superficial thrombosis, or Baker's cyst.

## 2012-09-30 NOTE — Progress Notes (Signed)
D- Report of low BP and oxygen saturation from NT.  Upon going to the room to assess patient his BP was improved 101/58 and Oxygen saturation was 87% on  6 LPM [place].  Pt denies complaints including chest pain and SOB.  Upon assessment Pt remains A&O x 4 and  lung sounds are more diminished than on previous assessment in lung bases.  A- Pt placed on a VM with saturations between 83-87% on 50%.  Dr. Rito Ehrlich notified of the Pt condition and decrease in oxygen saturations and new orders were placed in computer and carried out.  Respiratory called to assess patient and RT came to patient bedside and administered patient an albuterol respiratory treatment and placed patient on a 100% NRB.  Dr. Rito Ehrlich came to pt bedside to assess patient and gave orders for patient to be transferred to ICU for BIPAP.  Pt made aware of the need for transfer to ICU.  R- Report called to Bob,RN in ICU.  Pt transported to room ICU 4 via wheelchair with 100% NRB and saturations 97-100%.  Pt placed in ICU bed and in care of ICU staff.  No distress noted and patient denied complaint.  Nursing staff to continue to monitor.

## 2012-09-30 NOTE — Consult Note (Signed)
Consult requested by: Dr. Rito Ehrlich  Consult requested for respiratory failure:  HPI: This is a 37 year old who has a history of Hodgkin's lymphoma and who has been being treated with chemotherapy including bleomycin. He also has been receiving other forms of chemotherapy as noted in the oncology note. He came to the emergency because of weakness fatigue shortness of breath and fever to 100.8. He said this had been going on for about 3 days. Chest x-ray done in the emergency room showed bilateral infiltrates and he has been treated for community-acquired pneumonia. Last night he developed increasing shortness of breath some hypotension and hypoxia. He was placed on a nonrebreather mask and he improved . He was transferred to the intensive care unit and started on BiPAP. CT scan showed what looks like extensive bilateral infiltrates questionably ARDS and a small pulmonary embolus that is probably a incidental finding  Past Medical History  Diagnosis Date  . Mass of left side of neck 04/2012  . 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 Hodgkin's disease   . Hodgkin lymphoma   . Diastolic dysfunction     Grade 2  . Acute respiratory failure with hypoxia 09/29/2012  . Pulmonary emboli 09/29/2012     No family history on file.   History   Social History  . Marital Status: Married    Spouse Name: N/A    Number of Children: N/A  . Years of Education: N/A   Social History Main Topics  . Smoking status: Former Smoker -- 0.1 packs/day for 10 years    Types: Cigarettes    Quit date: 02/26/2012  . Smokeless tobacco: Never Used  . Alcohol Use: No  . Drug Use: No  . Sexually Active:    Other Topics Concern  . None   Social History Narrative  . None     ROS: No history of any sort of lung problems asthma COPD et Karie Soda. He does have a short smoking history. He denies chest pain. He has not had hemoptysis.    Objective: Vital signs in last 24 hours: Temp:   [97.4 F (36.3 C)-102 F (38.9 C)] 98.2 F (36.8 C) (02/05 0801) Pulse Rate:  [64-103] 64  (02/05 0801) Resp:  [18-31] 21  (02/05 0801) BP: (90-178)/(46-67) 178/67 mmHg (02/05 0801) SpO2:  [86 %-100 %] 100 % (02/05 0800) FiO2 (%):  [44 %-100 %] 96 % (02/05 0801) Weight:  [91.3 kg (201 lb 4.5 oz)-91.8 kg (202 lb 6.1 oz)] 91.3 kg (201 lb 4.5 oz) (02/05 0801) Weight change: -5.723 kg (-12 lb 9.9 oz) Last BM Date: 09/29/12  Intake/Output from previous day: 02/04 0701 - 02/05 0700 In: 3474.5 [P.O.:680; I.V.:1354.5; IV Piggyback:1440] Out: 900 [Urine:900]  PHYSICAL EXAM He is awake and alert and able to talk. He is on BiPAP. He is hard of hearing. His nose and throat clear. Mucous membranes are moist. His neck is supple. His chest shows fine rales in the bases bilaterally. His heart is regular with a tachycardia. I do not hear an S3 gallop. His abdomen is soft without masses. Bowel sounds present and active. Extremities show no edema. Central nervous system examination is grossly intact he has mild respiratory distress  Lab Results: Basic Metabolic Panel:  Basename 09/29/12 0537 09/28/12 0739  NA 136 135  K 4.3 3.6  CL 104 99  CO2 27 27  GLUCOSE 112* 122*  BUN 8 13  CREATININE 0.98 1.04  CALCIUM  8.8 8.9  MG -- --  PHOS -- --   Liver Function Tests:  Basename 09/29/12 0537 09/28/12 0739  AST 24 25  ALT 20 24  ALKPHOS 62 64  BILITOT 0.5 0.7  PROT 5.8* 6.3  ALBUMIN 2.7* 3.2*   No results found for this basename: LIPASE:2,AMYLASE:2 in the last 72 hours No results found for this basename: AMMONIA:2 in the last 72 hours CBC:  Basename 09/30/12 0506 09/29/12 0537 09/28/12 0739  WBC 13.7* 9.2 --  NEUTROABS -- -- 5.2  HGB 9.8* 10.0* --  HCT 29.5* 30.6* --  MCV 83.8 84.8 --  PLT 183 159 --   Cardiac Enzymes:  Basename 09/28/12 0830  CKTOTAL --  CKMB --  CKMBINDEX --  TROPONINI <0.30   BNP:  Basename 09/30/12 0506 09/28/12 0830  PROBNP 41.5 8.3   D-Dimer: No  results found for this basename: DDIMER:2 in the last 72 hours CBG: No results found for this basename: GLUCAP:6 in the last 72 hours Hemoglobin A1C: No results found for this basename: HGBA1C in the last 72 hours Fasting Lipid Panel: No results found for this basename: CHOL,HDL,LDLCALC,TRIG,CHOLHDL,LDLDIRECT in the last 72 hours Thyroid Function Tests:  [facility] 09/28/12 0739  TSH 1.635  T4TOTAL --  Domingo Cocking --  T3FREE --  THYROIDAB --   Anemia Panel:  Alvira Philips 09/28/12 0739  VITAMINB12 1789*  FOLATE --  FERRITIN --  TIBC --  IRON --  RETICCTPCT --   Coagulation: No results found for this basename: LABPROT:2,INR:2 in the last 72 hours Urine Drug Screen: Drugs of Abuse  No results found for this basename: labopia, cocainscrnur, labbenz, amphetmu, thcu, labbarb    Alcohol Level: No results found for this basename: ETH:2 in the last 72 hours Urinalysis: No results found for this basename: COLORURINE:2,APPERANCEUR:2,LABSPEC:2,PHURINE:2,GLUCOSEU:2,HGBUR:2,BILIRUBINUR:2,KETONESUR:2,PROTEINUR:2,UROBILINOGEN:2,NITRITE:2,LEUKOCYTESUR:2 in the last 72 hours Misc. Labs:   ABGS:  Basename 09/30/12 0258  PHART 7.390  PO2ART 64.1*  TCO2 20.0  HCO3 22.8     MICROBIOLOGY: Recent Results (from the past 240 hour(s))  CULTURE, BLOOD (ROUTINE X 2)     Status: Normal (Preliminary result)   Collection Time   09/28/12  8:48 AM      Component Value Range Status Comment   Specimen Description BLOOD LEFT ANTECUBITAL   Final    Special Requests     Final    Value: BOTTLES DRAWN AEROBIC AND ANAEROBIC AEB=8CC ANA=6CC   Culture NO GROWTH 1 DAY   Final    Report Status PENDING   Incomplete   CULTURE, BLOOD (ROUTINE X 2)     Status: Normal (Preliminary result)   Collection Time   09/28/12  8:51 AM      Component Value Range Status Comment   Specimen Description BLOOD LEFT ANTECUBITAL   Final    Special Requests BOTTLES DRAWN AEROBIC AND ANAEROBIC 6CC   Final    Culture NO GROWTH 1 DAY    Final    Report Status PENDING   Incomplete   MRSA PCR SCREENING     Status: Normal   Collection Time   09/30/12  4:30 AM      Component Value Range Status Comment   MRSA by PCR NEGATIVE  NEGATIVE Final     Studies/Results: Ct Angio Chest Pe W/cm &/or Wo Cm  09/29/2012  **ADDENDUM** CREATED: 09/29/2012 09:41:00  This report was communicated directly by phone to Toya Smothers, September 29, 2012,  939 AM.  **END ADDENDUM** SIGNED BY: Rutherford Guys. Margarita Grizzle, M.D.   09/29/2012  *  RADIOLOGY REPORT*  Clinical Data: No Hodgkin's disease with shortness of breath and hypoxia  CT ANGIOGRAPHY CHEST  Technique:  Multidetector CT imaging of the chest using the standard protocol during bolus administration of intravenous contrast. Multiplanar reconstructed images including MIPs were obtained and reviewed to evaluate the vascular anatomy.  Contrast: OMNIPAQUE IOHEXOL 350 MG/ML SOLN  Comparison: None.  Findings: There is a focal pulmonary embolus in the right lower lobe pulmonary artery.  No other pulmonary emboli identified. There is no thoracic aortic aneurysm or dissection.  There is widespread alveolar opacity, probably representing ARDS.  There is adenopathy throughout the mediastinum.  The largest individual lymph node is between the ascending aorta and superior vena cava, measuring 2.79 x 1.5 cm.  Somewhat similar lymph nodes are noted just to the left of the left main pulmonary artery. There is a right hilar lymph node measuring 1.8 x 2.0 cm.  There is a subcarinal lymph node measuring 2.3 x 1.6 cm.  Pericardium is not thickened.  The visualized upper abdominal structures appear grossly normal.  There are no blastic or lytic bone lesions.  IMPRESSION: There is a focal pulmonary embolus in the right lower lobe pulmonary artery region.  There is no central pulmonary embolus seen.  Adenopathy consistent with known Hodgkin's disease.  Widespread alveolar pneumonitis.  Suspect ARDS. An allergic type response.  cause this  appearance as well.  Widespread infectious pneumonia is a third differential consideration.   Original Report Authenticated By: Bretta Bang, M.D.    Dg Chest Port 1 View  09/30/2012  *RADIOLOGY REPORT*  Clinical Data: Hypoxia.  Pulmonary embolus.  ARDS.  Hodgkin's lymphoma.  PORTABLE CHEST - 1 VIEW  Comparison: 09/28/2012  Findings: Shallow inspiration.  Cardiac enlargement with pulmonary vascular congestion.  Diffuse airspace and interstitial infiltration throughout both lungs.  Infiltrative changes demonstrates some progression since the previous study.  Central venous catheter is unchanged.  No blunting of costophrenic angles. No pneumothorax.  IMPRESSION: Cardiac enlargement and pulmonary vascular congestion.  Progression of diffuse airspace and interstitial disease since previous study.   Original Report Authenticated By: Burman Nieves, M.D.     Medications:  Prior to Admission:  Prescriptions prior to admission  Medication Sig Dispense Refill  . DACARBAZINE IV Inject into the vein every 28 (twenty-eight) days. Days 1, 15 every 28 days      . dexamethasone (DECADRON) 4 MG tablet Take 8 mg by mouth. Starting the day after chemo, take 2 tablets in the am and 2 tablets in the pm for 3 days.      Marland Kitchen DOXOrubicin HCl (ADRIAMYCIN IV) Inject into the vein every 28 (twenty-eight) days. Days 1, 15 every 28 days      . lidocaine-prilocaine (EMLA) cream Apply topically as needed. Apply a quarter sized amount to port site 1 hour prior to chemo. Do not rub in. Cover with plastic.      . Multiple Vitamin (MULTIVITAMIN) tablet Take 1 tablet by mouth daily.      . ondansetron (ZOFRAN) 8 MG tablet Take 8 mg by mouth. Starting the day after chemo, take 1 tablet in the am and 1 tablet in the pm for 3 days. Then may take 1 tablet two times a day IF needed for nausea/vomiting. Side Effect: constipation      . potassium chloride SA (K-DUR,KLOR-CON) 20 MEQ tablet Take 20 mEq by mouth daily.      . Ranitidine HCl  (ZANTAC PO) Take 1 capsule by mouth as needed.      Marland Kitchen  simethicone (MYLICON) 80 MG chewable tablet Chew 80 mg by mouth every 6 (six) hours as needed. For gas      . sodium chloride 0.9 % SOLN 50 mL with bleomycin 30 UNITS SOLR Inject into the vein every 28 (twenty-eight) days. Days 1,15 every 28 days  (do not remove this medication from patient's profile even when he is done with chemo)!!! Medical staff need to know that this patient has received this drug if he is to undergo anesthesia (drug starts on 06/09/12)!!!!      . sodium chloride 0.9 % SOLN 50 mL with vinBLAStine 1 MG/ML SOLN Inject into the vein every 28 (twenty-eight) days. Days 1, 15 every 28 days       Scheduled:   . albuterol  2.5 mg Nebulization Q4H WA  . benzonatate  100 mg Oral TID  . enoxaparin (LOVENOX) injection  90 mg Subcutaneous Q12H  . famotidine  10 mg Oral Daily  . levofloxacin  750 mg Intravenous Q24H  . methylPREDNISolone (SOLU-MEDROL) injection  125 mg Intravenous Q6H  . oxymetazoline  1 spray Each Nare BID  . potassium chloride  20 mEq Oral Daily  . sodium chloride  3 mL Intravenous Q12H  . sulfamethoxazole-trimethoprim  320 mg Intravenous Q6H  . vancomycin  1,250 mg Intravenous Q8H   Continuous:   . 0.9 % NaCl with KCl 20 mEq / L 20 mL/hr at 09/30/12 0800   ZOX:[id number], acetaminophen, alum & mag hydroxide-simeth, chlorpheniramine-HYDROcodone, HYDROcodone-acetaminophen, ibuprofen, ondansetron (ZOFRAN) IV, ondansetron, simethicone, sodium chloride  Assesment: He has bilateral infiltrates which may be an allergic alveolitis from his bleomycin, ARDS pneumonia including possibility of pneumocystis. He has acute hypoxic respiratory failure and is doing okay on BiPAP but clearly is not improving. I discussed this with his family at bedside and they requested he be transferred to a higher level of care which I think is appropriate. I discussed his situation with Dr. Kerry Hough hospitalist attending and with Dr.  Mickel Fuchs his oncologist. Principal Problem:  *CAP (community acquired pneumonia) Active Problems:  Hodgkin's disease  Hypoxia  Thrombocytopenia  Anemia  Dyspnea  Pleural effusion on right  Pulmonary emboli  Acute respiratory failure with hypoxia    Plan: He is going to be transferred to the intensivist at [facility]. I went ahead and gave him Solu-Cortef in case he has the bleomycin allergic alveolitis. I told his family that I think he may end up needing to be intubated and placed on ventilator but that that actually may help as far as being sure what's going on. I don't think he needs intubation right now.    LOS: 2 days   Samuel [name] L 09/30/2012, 8:47 AM

## 2012-09-30 NOTE — Progress Notes (Signed)
  Echocardiogram 2D Echocardiogram has been performed.  Samuel [name] 09/30/2012, 3:38 PM

## 2012-09-30 NOTE — Progress Notes (Addendum)
Called by Rn for hypoxia on VM.  Patient in no distress. Denies any chest pain. Has minimal shortness of breath and cough.   Temp was 102F at 9:30P Lungs reveal coarse breath sounds bilaterally. Very scattered wheezing. Decreased air entry at bases. No definite crackles. Cardiac examination is benign. He is alert and oriented x 3. No focal deficits.  Repeat CXR was ordered and it shows worsening bilateral infiltrates. Repeat ABg is pending.  Etiology for his presentation not entirely clear. Patient has Hodgkin's Lymphoma on chemo and is therefore immunosuppressed. Could be atypical infection. Unlikely to be pulm edema based on examination, fever and normal BNP. EF was normal though he did have diastolic dysfunction on recent ECHO. Hypoxia is partly from PE as well. He may need a bronchoscopy if he doesn't improve.  Will continue current antibiotics. Will transfer to ICU for closer monitoring. Bipap. Consult Pulmonology in AM. Repeat pro BNP.  Critical care time: 30 mins  Vlasta Baskin 3:41 AM

## 2012-09-30 NOTE — Progress Notes (Signed)
[facility]             Progress Note   Subjective: Chart reviewed, patient seen and examined.  He was transferred to the ICU last night for worsening hypoxia and shortness of breath. Patient required Bipap therapy to maintain oxygen saturations.  He is able to communicate at this time.   Objective: Vital signs in last 24 hours: Temp:  [97.4 F (36.3 C)-102 F (38.9 C)] 98.2 F (36.8 C) (02/05 0801) Pulse Rate:  [64-103] 64  (02/05 0801) Resp:  [18-31] 21  (02/05 0801) BP: (90-178)/(46-67) 178/67 mmHg (02/05 0801) SpO2:  [86 %-100 %] 100 % (02/05 0800) FiO2 (%):  [44 %-100 %] 96 % (02/05 0801) Weight:  [91.3 kg (201 lb 4.5 oz)-91.8 kg (202 lb 6.1 oz)] 91.3 kg (201 lb 4.5 oz) (02/05 0801) Weight change: -5.723 kg (-12 lb 9.9 oz) Last BM Date: 09/29/12  Intake/Output from previous day: 02/04 0701 - 02/05 0700 In: 3474.5 [P.O.:680; I.V.:1354.5; IV Piggyback:1440] Out: 900 [Urine:900] Total I/O In: 20 [I.V.:20] Out: 500 [Urine:500]   Physical Exam: General: Alert, awake, oriented x3, on bipap mask with mild respiratory distress. He is able to communicate HEENT: No bruits, no goiter. Heart: s1, s2, tachycardic Lungs: fine crackles at bases. Abdomen: Soft, nontender, nondistended, positive bowel sounds. Extremities: No clubbing cyanosis or edema with positive pedal pulses. Neuro: Grossly intact, nonfocal.    Lab Results: Basic Metabolic Panel:  Basename 09/29/12 0537 09/28/12 0739  NA 136 135  K 4.3 3.6  CL 104 99  CO2 27 27  GLUCOSE 112* 122*  BUN 8 13  CREATININE 0.98 1.04  CALCIUM 8.8 8.9  MG -- --  PHOS -- --   Liver Function Tests:  Basename 09/29/12 0537 09/28/12 0739  AST 24 25  ALT 20 24  ALKPHOS 62 64  BILITOT 0.5 0.7  PROT 5.8* 6.3  ALBUMIN 2.7* 3.2*   No results found for this basename: LIPASE:2,AMYLASE:2 in the last 72 hours No results found for this basename: AMMONIA:2 in the last 72 hours CBC:  Basename 09/30/12 0506  09/29/12 0537 09/28/12 0739  WBC 13.7* 9.2 --  NEUTROABS -- -- 5.2  HGB 9.8* 10.0* --  HCT 29.5* 30.6* --  MCV 83.8 84.8 --  PLT 183 159 --   Cardiac Enzymes:  Basename 09/28/12 0830  CKTOTAL --  CKMB --  CKMBINDEX --  TROPONINI <0.30   BNP:  Basename 09/30/12 0506 09/28/12 0830  PROBNP 41.5 8.3   D-Dimer: No results found for this basename: DDIMER:2 in the last 72 hours CBG: No results found for this basename: GLUCAP:6 in the last 72 hours Hemoglobin A1C: No results found for this basename: HGBA1C in the last 72 hours Fasting Lipid Panel: No results found for this basename: CHOL,HDL,LDLCALC,TRIG,CHOLHDL,LDLDIRECT in the last 72 hours Thyroid Function Tests:  Basename 09/28/12 0739  TSH 1.635  T4TOTAL --  Domingo Cocking --  T3FREE --  THYROIDAB --   Anemia Panel:  Alvira Philips 09/28/12 0739  VITAMINB12 1789*  FOLATE --  FERRITIN --  TIBC --  IRON --  RETICCTPCT --   Coagulation: No results found for this basename: LABPROT:2,INR:2 in the last 72 hours Urine Drug Screen: Drugs of Abuse  No results found for this basename: labopia, cocainscrnur, labbenz, amphetmu, thcu, labbarb    Alcohol Level: No results found for this basename: ETH:2 in the last 72 hours Urinalysis: No results found for this basename: COLORURINE:2,APPERANCEUR:2,LABSPEC:2,PHURINE:2,GLUCOSEU:2,HGBUR:2,BILIRUBINUR:2,KETONESUR:2,PROTEINUR:2,UROBILINOGEN:2,NITRITE:2,LEUKOCYTESUR:2 in the last 72 hours  Recent Results (from the  past 240 hour(s))  CULTURE, BLOOD (ROUTINE X 2)     Status: Normal (Preliminary result)   Collection Time   09/28/12  8:48 AM      Component Value Range Status Comment   Specimen Description BLOOD LEFT ANTECUBITAL   Final    Special Requests     Final    Value: BOTTLES DRAWN AEROBIC AND ANAEROBIC AEB=8CC ANA=6CC   Culture NO GROWTH 1 DAY   Final    Report Status PENDING   Incomplete   CULTURE, BLOOD (ROUTINE X 2)     Status: Normal (Preliminary result)   Collection Time    09/28/12  8:51 AM      Component Value Range Status Comment   Specimen Description BLOOD LEFT ANTECUBITAL   Final    Special Requests BOTTLES DRAWN AEROBIC AND ANAEROBIC 6CC   Final    Culture NO GROWTH 1 DAY   Final    Report Status PENDING   Incomplete   MRSA PCR SCREENING     Status: Normal   Collection Time   09/30/12  4:30 AM      Component Value Range Status Comment   MRSA by PCR NEGATIVE  NEGATIVE Final     Studies/Results: Ct Angio Chest Pe W/cm &/or Wo Cm  09/29/2012  **ADDENDUM** CREATED: 09/29/2012 09:41:00  This report was communicated directly by phone to Toya Smothers, September 29, 2012,  939 AM.  **END ADDENDUM** SIGNED BY: Rutherford Guys. Margarita Grizzle, M.D.   09/29/2012  *RADIOLOGY REPORT*  Clinical Data: No Hodgkin's disease with shortness of breath and hypoxia  CT ANGIOGRAPHY CHEST  Technique:  Multidetector CT imaging of the chest using the standard protocol during bolus administration of intravenous contrast. Multiplanar reconstructed images including MIPs were obtained and reviewed to evaluate the vascular anatomy.  Contrast: OMNIPAQUE IOHEXOL 350 MG/ML SOLN  Comparison: None.  Findings: There is a focal pulmonary embolus in the right lower lobe pulmonary artery.  No other pulmonary emboli identified. There is no thoracic aortic aneurysm or dissection.  There is widespread alveolar opacity, probably representing ARDS.  There is adenopathy throughout the mediastinum.  The largest individual lymph node is between the ascending aorta and superior vena cava, measuring 2.79 x 1.5 cm.  Somewhat similar lymph nodes are noted just to the left of the left main pulmonary artery. There is a right hilar lymph node measuring 1.8 x 2.0 cm.  There is a subcarinal lymph node measuring 2.3 x 1.6 cm.  Pericardium is not thickened.  The visualized upper abdominal structures appear grossly normal.  There are no blastic or lytic bone lesions.  IMPRESSION: There is a focal pulmonary embolus in the right lower lobe  pulmonary artery region.  There is no central pulmonary embolus seen.  Adenopathy consistent with known Hodgkin's disease.  Widespread alveolar pneumonitis.  Suspect ARDS. An allergic type response.  cause this appearance as well.  Widespread infectious pneumonia is a third differential consideration.   Original Report Authenticated By: Bretta Bang, M.D.    Dg Chest Port 1 View  09/30/2012  *RADIOLOGY REPORT*  Clinical Data: Hypoxia.  Pulmonary embolus.  ARDS.  Hodgkin's lymphoma.  PORTABLE CHEST - 1 VIEW  Comparison: 09/28/2012  Findings: Shallow inspiration.  Cardiac enlargement with pulmonary vascular congestion.  Diffuse airspace and interstitial infiltration throughout both lungs.  Infiltrative changes demonstrates some progression since the previous study.  Central venous catheter is unchanged.  No blunting of costophrenic angles. No pneumothorax.  IMPRESSION: Cardiac enlargement and pulmonary vascular  congestion.  Progression of diffuse airspace and interstitial disease since previous study.   Original Report Authenticated By: Burman Nieves, M.D.     Medications: Scheduled Meds:   . albuterol  2.5 mg Nebulization Q4H WA  . benzonatate  100 mg Oral TID  . enoxaparin (LOVENOX) injection  90 mg Subcutaneous Q12H  . famotidine  10 mg Oral Daily  . levofloxacin  750 mg Intravenous Q24H  . methylPREDNISolone (SOLU-MEDROL) injection  125 mg Intravenous Q6H  . oxymetazoline  1 spray Each Nare BID  . potassium chloride  20 mEq Oral Daily  . sodium chloride  3 mL Intravenous Q12H  . sulfamethoxazole-trimethoprim  320 mg Intravenous Q6H  . vancomycin  1,250 mg Intravenous Q8H   Continuous Infusions:   . 0.9 % NaCl with KCl 20 mEq / L 20 mL/hr at 09/30/12 0800   PRN Meds:.acetaminophen, acetaminophen, alum & mag hydroxide-simeth, chlorpheniramine-HYDROcodone, HYDROcodone-acetaminophen, ibuprofen, ondansetron (ZOFRAN) IV, ondansetron, simethicone, sodium  chloride  Assessment/Plan:  Principal Problem:  *CAP (community acquired pneumonia) Active Problems:  Hodgkin's disease  Hypoxia  Thrombocytopenia  Anemia  Dyspnea  Pleural effusion on right  Pulmonary emboli  Acute respiratory failure with hypoxia  Plan:  1. Acute respiratory failure.  Possible etiologies include bleomycin associated allergic alveolitis, pneumonia and ARDS. Patient is currently on bipap mask to maintain saturations. Patient was evaluated by Dr. Juanetta Gosling and felt that he would be better served by transferring to a higher level of care.  Dr. Juanetta Gosling discussed case with Dr. Tyson Alias, critical care medicine at [facility].  It was agreed that patient would benefit from transfer to [facility] and would likely require bronchoscopic evaluation. Greatly appreciate Dr. Juanetta Gosling assistance in this matter.  Once arrangements for transfer were made, patient's family requested transfer to [facility]. I did speak to Dr. Sharmon Revere, critical care medicine at [facility]. Unfortunately, there are no ICU beds available to accept transfers.  I discussed the case again with Dr. Tyson Alias, who has kindly accepted the patient in transfer again. [facility] transport services have been called to arrange patient transport  For now, we will continue antibiotics and supportive treatment.  He has been started on IV steroids for any element of allergic alveolitis.  2. Pulmonary embolus.  Small PE found on CT scan.  He has been started on lovenox  3. Hodgkin's lymphoma.  Clinically in remission by PET scan.  He is currently undergoing chemotherapy and is immunosuppressed.  Time spent coordinating care:   LOS: 2 days   MEMON,JEHANZEB [facility] Pager: [phone] 09/30/2012, 9:27 AM

## 2012-10-01 DIAGNOSIS — J189 Pneumonia, unspecified organism: Secondary | ICD-10-CM

## 2012-10-01 LAB — GLUCOSE, CAPILLARY
Glucose-Capillary: 115 mg/dL — ABNORMAL HIGH (ref 70–99)
Glucose-Capillary: 167 mg/dL — ABNORMAL HIGH (ref 70–99)
Glucose-Capillary: 123 mg/dL — ABNORMAL HIGH (ref 70–99)
Glucose-Capillary: 144 mg/dL — ABNORMAL HIGH (ref 70–99)

## 2012-10-01 LAB — CBC
MCV: 81.2 fL (ref 78.0–100.0)
Platelets: 226 10*3/uL (ref 150–400)
RBC: 3.41 MIL/uL — ABNORMAL LOW (ref 4.22–5.81)
RDW: 18.1 % — ABNORMAL HIGH (ref 11.5–15.5)
WBC: 7 10*3/uL (ref 4.0–10.5)

## 2012-10-01 LAB — RESPIRATORY VIRUS PANEL
Adenovirus: NOT DETECTED
Influenza A H1: NOT DETECTED
Influenza A H3: NOT DETECTED
Influenza A: NOT DETECTED
Influenza B: NOT DETECTED
Parainfluenza 3: NOT DETECTED
Respiratory Syncytial Virus B: NOT DETECTED

## 2012-10-01 LAB — VANCOMYCIN, TROUGH: Vancomycin Tr: 11.7 ug/mL (ref 10.0–20.0)

## 2012-10-01 LAB — LEGIONELLA ANTIGEN, URINE: Legionella Antigen, Urine: NEGATIVE

## 2012-10-01 LAB — EPSTEIN-BARR VIRUS VCA, IGG: EBV VCA IgG: 120 U/mL — ABNORMAL HIGH (ref <18.0)

## 2012-10-01 LAB — CORTISOL: Cortisol, Plasma: 31.4 ug/dL

## 2012-10-01 LAB — LACTATE DEHYDROGENASE: LDH: 328 U/L — ABNORMAL HIGH (ref 94–250)

## 2012-10-01 MED ORDER — BENZONATATE 100 MG PO CAPS
100.0000 mg | ORAL_CAPSULE | Freq: Three times a day (TID) | ORAL | Status: DC | PRN
  Filled 2012-10-01: qty 1

## 2012-10-01 MED ORDER — VANCOMYCIN HCL 10 G IV SOLR
1500.0000 mg | Freq: Three times a day (TID) | INTRAVENOUS | Status: DC
  Administered 2012-10-01 – 2012-10-05 (×12): 1500 mg via INTRAVENOUS
  Filled 2012-10-01 (×18): qty 1500

## 2012-10-01 NOTE — Progress Notes (Addendum)
ANTICOAGULATION CONSULT NOTE - Follow Up Consult  Pharmacy Consult for Heparin Indication: pulmonary embolus  Allergies  Allergen Reactions  . Amoxicillin Rash    Patient Measurements: Height: 5\' 6"  (167.6 cm) Weight: 200 lb 2.8 oz (90.8 kg) IBW/kg (Calculated) : 63.8  Heparin Dosing Weight: 83 Kg  Vital Signs: Temp: 98.6 F (37 C) (02/05 2348) Temp src: Axillary (02/05 2348) BP: 114/62 mmHg (02/06 0200) Pulse Rate: 78  (02/06 0200)  Labs:  Basename 10/01/12 0230 10/01/12 0151 09/30/12 1206 09/30/12 0506 09/29/12 0537 09/28/12 0830 09/28/12 0739  HGB 9.5* -- 9.6* -- -- -- --  HCT 27.7* -- 28.6* 29.5* -- -- --  PLT 226 -- 208 183 -- -- --  APTT -- -- -- -- -- -- --  LABPROT -- -- -- -- -- -- --  INR -- -- -- -- -- -- --  HEPARINUNFRC -- 0.46 -- -- -- -- --  CREATININE -- -- 0.79 -- 0.98 -- 1.04  CKTOTAL -- -- -- -- -- -- --  CKMB -- -- -- -- -- -- --  TROPONINI -- -- -- -- -- <0.30 --    Estimated Creatinine Clearance: 134.7 ml/min (by C-G formula based on Cr of 0.79).   Medications:  Prescriptions prior to admission  Medication Sig Dispense Refill  . DACARBAZINE IV Inject into the vein every 28 (twenty-eight) days. Days 1, 15 every 28 days      . dexamethasone (DECADRON) 4 MG tablet Take 8 mg by mouth. Starting the day after chemo, take 2 tablets in the am and 2 tablets in the pm for 3 days.      Marland Kitchen DOXOrubicin HCl (ADRIAMYCIN IV) Inject into the vein every 28 (twenty-eight) days. Days 1, 15 every 28 days      . lidocaine-prilocaine (EMLA) cream Apply topically as needed. Apply a quarter sized amount to port site 1 hour prior to chemo. Do not rub in. Cover with plastic.      . Multiple Vitamin (MULTIVITAMIN) tablet Take 1 tablet by mouth daily.      . ondansetron (ZOFRAN) 8 MG tablet Take 8 mg by mouth. Starting the day after chemo, take 1 tablet in the am and 1 tablet in the pm for 3 days. Then may take 1 tablet two times a day IF needed for nausea/vomiting. Side  Effect: constipation      . potassium chloride SA (K-DUR,KLOR-CON) 20 MEQ tablet Take 20 mEq by mouth daily.      . Ranitidine HCl (ZANTAC PO) Take 1 capsule by mouth as needed.      . simethicone (MYLICON) 80 MG chewable tablet Chew 80 mg by mouth every 6 (six) hours as needed. For gas      . sodium chloride 0.9 % SOLN 50 mL with bleomycin 30 UNITS SOLR Inject into the vein every 28 (twenty-eight) days. Days 1,15 every 28 days  (do not remove this medication from patient's profile even when he is done with chemo)!!! Medical staff need to know that this patient has received this drug if he is to undergo anesthesia (drug starts on 06/09/12)!!!!      . sodium chloride 0.9 % SOLN 50 mL with vinBLAStine 1 MG/ML SOLN Inject into the vein every 28 (twenty-eight) days. Days 1, 15 every 28 days          Labs:  [facility] 10/01/12 0230 09/30/12 1206 09/30/12 0506 09/29/12 0537 09/28/12 0739  WBC 7.0 6.4 13.7* -- --  HGB 9.5* 9.6* 9.8* -- --  PLT 226 208 183 -- --  LABCREA -- -- -- -- --  CREATININE -- 0.79 -- 0.98 1.04   Estimated Creatinine Clearance: 134.7 ml/min (by C-G formula based on Cr of 0.79).   Assessment: 37 yo [name] with Hodgkin's Lymphoma currently undergoing chemotherapy on IV heparin for RLL PE.   Heparin level (0.46) is at-goal on 1350 units/hr.   Goal of Therapy:  Heparin level 0.3-0.7 units/ml Monitor platelets by anticoagulation protocol: Yes  Plan: . 1. Continue IV heparin at 1350 units/hr.  2. Heparin level in 6 hours to confirm dosing.   Samuel [name], PharmD 10/01/2012 3:29 AM

## 2012-10-01 NOTE — Progress Notes (Signed)
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  Transferred to [facility] 2/05 for further therapy.  Significant PMHx of Diastolic CHF, b/l hearing loss  SIGNIFICANT EVENTS:  2/5 - Transferred to [facility] MICU  TESTS: 09/07/12 PFT >> FEV1 3.64 (87%), FEV1% 84, TLC 6.47 (96%), DLCO 77% 09/29/12 CT chest >> RLL PE, diffuse b/l GGO and alveolar ASD, decreased LAN 09/30/12 Doppler legs >> acute Rt popliteal vein DVT 09/30/12 Echo >> EF 55 to 60%, PAS 35 mmHg  LINES / TUBES: Power Port - Left Chest Wall  CULTURES: Blood CX 2/3 >>> resp viral panel  2/5 >>> CMV 2/5>>> EPV 2/5>>> RSV 2/5>>> Ustrep 2/5>>>negative u legionella 2/5>>>negative MRSA 2/5 - NEG  ANTIBIOTICS: Bactrim 2/4 >>> Vanc 2/5 >>> Imipenem 2/5 >>> levaquin 2/5 >>> Tamilflu 2/5 >>>  SUBJECTIVE:  Breathing better.  Denies chest pain.  Feels BiPAP is helping.  VITAL SIGNS: Temp:  [97.4 F (36.3 C)-99.1 F (37.3 C)] 97.7 F (36.5 C) (02/06 1300) Pulse Rate:  [64-99] 78  (02/06 1400) Resp:  [15-31] 16  (02/06 1400) BP: (89-119)/(45-72) 106/59 mmHg (02/06 1400) SpO2:  [92 %-100 %] 99 % (02/06 1400) FiO2 (%):  [50 %-80 %] 50 % (02/06 1240) Weight:  [199 lb 8.3 oz (90.5 kg)] 199 lb 8.3 oz (90.5 kg) (02/06 0500) HEMODYNAMICS:   VENTILATOR SETTINGS: Vent Mode:  [-]  FiO2 (%):  [50 %-80 %] 50 % INTAKE / OUTPUT: Intake/Output      02/05 0701 - 02/06 0700 02/06 0701 - 02/07 0700   P.O.     I.V. (mL/kg) 504.7 (5.6) 194.5 (2.1)   IV Piggyback 1493 500   Total Intake(mL/kg) 1997.7 (22.1) 694.5  (7.7)   Urine (mL/kg/hr) 3625 (1.7) 900 (1.3)   Total Output 3625 900   Net -1627.3 -205.5        Stool Occurrence 1 x      PHYSICAL EXAMINATION: General: Wearing BiPAP, speak in full sentences Neuro: Normal strength HEENT: No JVP Cardiovascular: s1s2 regular, no murmur Lungs: b/l rhonchi Abdomen:  Soft, non-tender, non-distended Musculoskeletal: no edema Skin: no rashes  LABS:  Lab 10/01/12 0230 09/30/12 1850 09/30/12 1225 09/30/12 1206 09/30/12 0506 09/30/12 0258 09/29/12 1314 09/29/12 0537 09/28/12 0830 09/28/12 0739  HGB 9.5* -- -- 9.6* 9.8* -- -- -- -- --  WBC 7.0 -- -- 6.4 13.7* -- -- -- -- --  PLT 226 -- -- 208 183 -- -- -- -- --  NA -- -- -- 134* -- -- -- 136 -- 135  K -- -- -- 4.1 -- -- -- 4.3 -- --  CL -- -- -- 99 -- -- -- 104 -- 99  CO2 -- -- -- 25 -- -- -- 27 -- 27  GLUCOSE -- -- -- 123* -- -- -- 112* -- 122*  BUN -- -- -- 8 -- -- -- 8 -- 13  CREATININE -- -- -- 0.79 -- -- -- 0.98 -- 1.04  CALCIUM -- -- -- 8.8 -- -- -- 8.8 -- 8.9  MG -- -- -- -- -- -- -- -- -- --  PHOS -- -- -- -- -- -- -- -- -- --  AST -- -- -- 25 -- -- -- 24 -- 25  ALT -- -- -- 15 -- -- -- 20 -- 24  ALKPHOS -- -- -- 61 -- -- -- 62 -- 64  BILITOT -- -- -- 0.3 -- -- -- 0.5 -- 0.7  PROT -- -- -- 6.1 -- -- -- 5.8* -- 6.3  ALBUMIN -- -- -- 2.3* -- -- -- 2.7* -- 3.2*  APTT -- -- -- -- -- -- -- -- -- --  INR -- -- -- -- -- -- -- -- -- --  LATICACIDVEN -- -- -- 1.1 -- -- -- -- -- --  TROPONINI -- -- -- -- -- -- -- -- <0.30 --  PROCALCITON -- 0.51 -- -- -- -- -- -- -- --  PROBNP -- -- -- 82.6 41.5 -- -- -- 8.3 --  O2SATVEN -- -- -- -- -- -- -- -- -- --  PHART -- -- 7.450 -- -- 7.390 7.427 -- -- --  PCO2ART -- -- 33.4* -- -- 38.6 37.3 -- -- --  PO2ART -- -- 316.0* -- -- 64.1* 72.4* -- -- --    Lab 10/01/12 1240 10/01/12 0745 10/01/12 0404 09/30/12 2347 09/30/12 1959  GLUCAP 115* 154* 132* 129* 112*     Imaging: Dg Chest Port 1 View  09/30/2012  *RADIOLOGY REPORT*  Clinical Data: Hypoxia.   Pulmonary embolus.  ARDS.  Hodgkin's lymphoma.  PORTABLE CHEST - 1 VIEW  Comparison: 09/28/2012  Findings: Shallow inspiration.  Cardiac enlargement with pulmonary vascular congestion.  Diffuse airspace and interstitial infiltration throughout both lungs.  Infiltrative changes demonstrates some progression since the previous study.  Central venous catheter is unchanged.  No blunting of costophrenic angles. No pneumothorax.  IMPRESSION: Cardiac enlargement and pulmonary vascular congestion.  Progression of diffuse airspace and interstitial disease since previous study.   Original Report Authenticated By: Burman Nieves, M.D.      ASSESSMENT / PLAN:  PULMONARY A: Acute respiratory failure 2nd to pulmonary infiltrates >> ?infection vs drug reaction (bleomycin) vs inflammatory. P:   Empiric solumedrol Need to minimize FiO2 as tolerate with concern for bleomycin toxicity F/u CXR BiPAP as needed >> hopefully can avoid intubation Defer bronchoscopy unless intubated  CARDIOVASCULAR A: Acute RLL PE and Rt leg DVT. Chronic Diastolic Dysfunction. P:  Heparin gtt until more stable Monitor hemodynamics  RENAL A: No issues. P:   Monitor renal fx, urine outpt F/u and replace electrolytes as needed  GASTROINTESTINAL A: Nutrition. P:   NPO until more stable respiratory status  HEMATOLOGIC A: Anemia of chronic disease and critical illness. Hodgkin's lymphoma. P:  F/u CBC Transfuse for Hb < 7  INFECTIOUS A:  B/l diffuse pulmonary infiltrates with fever >> concern for pneumonia in immunosuppressed pt (Lymphoma s/p chemo). P:   Abx, anti-virals per ID pending cx results   ENDOCRINE A:  Steroid induced hyperglycemia. P:   SSI  NEUROLOGIC A:  No Acute Issue P:   Monitor  Updated wife at bedside.  CC time 35 minutes.  Coralyn Helling, MD [facility] Pulmonary/Critical Care 10/01/2012, 3:11 PM Pager:  [phone] After 3pm call: [phone]

## 2012-10-01 NOTE — Progress Notes (Signed)
UR Completed.  Samuel [name] [phone] 10/01/2012  

## 2012-10-01 NOTE — Progress Notes (Signed)
ANTICOAGULATION & ANTIBIOTIC CONSULT NOTE - Follow Up Consult  Pharmacy Consult for Heparin Indication: pulmonary embolus Pharmacy Consult for Vancomycin Indication: rule out pneumonia and rule out sepsis  Allergies  Allergen Reactions  . Amoxicillin Rash    Patient Measurements: Height: 5\' 6"  (167.6 cm) Weight: 199 lb 8.3 oz (90.5 kg) IBW/kg (Calculated) : 63.8  Heparin Dosing Weight: 83 Kg  Vital Signs: Temp: 97.6 F (36.4 C) (02/06 0833) Temp src: Oral (02/06 0833) BP: 107/62 mmHg (02/06 1000) Pulse Rate: 84  (02/06 1000)  Labs:  Basename 10/01/12 0800 10/01/12 0230 10/01/12 0151 09/30/12 1206 09/30/12 0506 09/29/12 0537  HGB -- 9.5* -- 9.6* -- --  HCT -- 27.7* -- 28.6* 29.5* --  PLT -- 226 -- 208 183 --  APTT -- -- -- -- -- --  LABPROT -- -- -- -- -- --  INR -- -- -- -- -- --  HEPARINUNFRC 0.36 -- 0.46 -- -- --  CREATININE -- -- -- 0.79 -- 0.98  CKTOTAL -- -- -- -- -- --  CKMB -- -- -- -- -- --  TROPONINI -- -- -- -- -- --    Estimated Creatinine Clearance: 134.5 ml/min (by C-G formula based on Cr of 0.79).   Medications:  Prescriptions prior to admission  Medication Sig Dispense Refill  . DACARBAZINE IV Inject into the vein every 28 (twenty-eight) days. Days 1, 15 every 28 days      . dexamethasone (DECADRON) 4 MG tablet Take 8 mg by mouth. Starting the day after chemo, take 2 tablets in the am and 2 tablets in the pm for 3 days.      Marland Kitchen DOXOrubicin HCl (ADRIAMYCIN IV) Inject into the vein every 28 (twenty-eight) days. Days 1, 15 every 28 days      . lidocaine-prilocaine (EMLA) cream Apply topically as needed. Apply a quarter sized amount to port site 1 hour prior to chemo. Do not rub in. Cover with plastic.      . Multiple Vitamin (MULTIVITAMIN) tablet Take 1 tablet by mouth daily.      . ondansetron (ZOFRAN) 8 MG tablet Take 8 mg by mouth. Starting the day after chemo, take 1 tablet in the am and 1 tablet in the pm for 3 days. Then may take 1 tablet two times  a day IF needed for nausea/vomiting. Side Effect: constipation      . potassium chloride SA (K-DUR,KLOR-CON) 20 MEQ tablet Take 20 mEq by mouth daily.      . Ranitidine HCl (ZANTAC PO) Take 1 capsule by mouth as needed.      . simethicone (MYLICON) 80 MG chewable tablet Chew 80 mg by mouth every 6 (six) hours as needed. For gas      . sodium chloride 0.9 % SOLN 50 mL with bleomycin 30 UNITS SOLR Inject into the vein every 28 (twenty-eight) days. Days 1,15 every 28 days  (do not remove this medication from patient's profile even when he is done with chemo)!!! Medical staff need to know that this patient has received this drug if he is to undergo anesthesia (drug starts on 06/09/12)!!!!      . sodium chloride 0.9 % SOLN 50 mL with vinBLAStine 1 MG/ML SOLN Inject into the vein every 28 (twenty-eight) days. Days 1, 15 every 28 days          Labs:  [facility] 10/01/12 0230 09/30/12 1206 09/30/12 0506 09/29/12 0537  WBC 7.0 6.4 13.7* --  HGB 9.5* 9.6* 9.8* --  PLT  226 208 183 --  LABCREA -- -- -- --  CREATININE -- 0.79 -- 0.98   Estimated Creatinine Clearance: 134.5 ml/min (by C-G formula based on Cr of 0.79).  Basename 10/01/12 0950 09/29/12 0848  VANCOTROUGH 11.7 13.1  VANCOPEAK -- --  VANCORANDOM -- --  GENTTROUGH -- --  GENTPEAK -- --  GENTRANDOM -- --  TOBRATROUGH -- --  TOBRAPEAK -- --  TOBRARND -- --  AMIKACINPEAK -- --  AMIKACINTROU -- --  AMIKACIN -- --     Microbiology: Recent Results (from the past 720 hour(s))  CULTURE, BLOOD (ROUTINE X 2)     Status: Normal (Preliminary result)   Collection Time   09/28/12  8:48 AM      Component Value Range Status Comment   Specimen Description BLOOD LEFT ANTECUBITAL   Final    Special Requests     Final    Value: BOTTLES DRAWN AEROBIC AND ANAEROBIC AEB=8CC ANA=6CC   Culture NO GROWTH 3 DAYS   Final    Report Status PENDING   Incomplete   CULTURE, BLOOD (ROUTINE X 2)     Status: Normal (Preliminary result)   Collection Time    09/28/12  8:51 AM      Component Value Range Status Comment   Specimen Description BLOOD LEFT ANTECUBITAL   Final    Special Requests BOTTLES DRAWN AEROBIC AND ANAEROBIC 6CC   Final    Culture NO GROWTH 3 DAYS   Final    Report Status PENDING   Incomplete   MRSA PCR SCREENING     Status: Normal   Collection Time   09/30/12  4:30 AM      Component Value Range Status Comment   MRSA by PCR NEGATIVE  NEGATIVE Final     Anti-infectives     Start     Dose/Rate Route Frequency Ordered Stop   09/30/12 1300   imipenem-cilastatin (PRIMAXIN) 500 mg in sodium chloride 0.9 % 100 mL IVPB        500 mg 200 mL/hr over 30 Minutes Intravenous 4 times per day 09/30/12 1244     09/30/12 1215   oseltamivir (TAMIFLU) capsule 75 mg        75 mg Oral 2 times daily 09/30/12 1215 10/05/12 0959   09/29/12 1800   vancomycin (VANCOCIN) 1,250 mg in sodium chloride 0.9 % 250 mL IVPB        1,250 mg 166.7 mL/hr over 90 Minutes Intravenous Every 8 hours 09/29/12 1321     09/29/12 1800  sulfamethoxazole-trimethoprim (BACTRIM) 320 mg in dextrose 5 % 500 mL IVPB       320 mg 346.7 mL/hr over 90 Minutes Intravenous 4 times per day 09/29/12 1559     09/28/12 1800   vancomycin (VANCOCIN) IVPB 1000 mg/200 mL premix  Status:  Discontinued        1,000 mg 200 mL/hr over 60 Minutes Intravenous Every 8 hours 09/28/12 1121 09/29/12 1321   09/28/12 0930   vancomycin (VANCOCIN) IVPB 1000 mg/200 mL premix  Status:  Discontinued        1,000 mg 200 mL/hr over 60 Minutes Intravenous  Once 09/28/12 0924 09/28/12 1121   09/28/12 0915   levofloxacin (LEVAQUIN) IVPB 750 mg        750 mg 100 mL/hr over 90 Minutes Intravenous Every 24 hours 09/28/12 0908            Assessment: Samuel [name] was transferred from [facility] for CCM management. He was admitted there  for 3-d hx of SOB and weakness. Notably, he has Hodgkin's Lymphoma and is currently undergoing chemotherapy Q 28 days. He was  Due for chemo 2/5. He continues on broad spectrum  abx given immune suppression, including TMP/SMX for suspected PCP. He is now afebrile and WBC is nml. PCT is 0.5. Scr is normal and  UOP has improved (1.7). Vanc dose was adjusted 2/4 to keep trough within goal, repeat trough is still sub-therapeutic (11.7). Samuel [name] also continues on IV heparin PE found on chest CT on 09/29/12 (converted from full dose Lovenox). Heparin level is therapeutic. Noted H/H decreased slightly, Plts are stable. No overt bleeding is reported.  Abx: 2/3: Vanc>> 2/3: Levaquin>> 2/4: TMP/SMX>> 2/5: Primaxin>>  2/3: Resp virus panel: 2/3: Blood: ngtd 2/5: MRSA PCR: neg  Goal of Therapy:  Vancomycin trough level 15-20 mcg/ml Heparin level 0.3-0.7 units/ml Monitor platelets by anticoagulation protocol: Yes  Plan:  - Increase Vancomycin to 1500mg  IV q 8h- will plan to recheck trough in 1-2 days - Continue IV heparin infusion at 1350 units/hr. - Daily heparin level and CBC - Will monitor cx/spec/sens, renal fn and clinical status daily.   Thanks, Renda Pohlman K. Allena Katz, PharmD, BCPS.  Clinical Pharmacist Pager [phone]. 10/01/2012 11:43 AM

## 2012-10-01 NOTE — Progress Notes (Signed)
Took off for break. Target time is 0900.

## 2012-10-01 NOTE — Progress Notes (Signed)
Off bipap for a break

## 2012-10-01 NOTE — Progress Notes (Addendum)
PULMONARY  / CRITICAL CARE MEDICINE  Name: Samuel [name] MRN: [id number] DOB: Sep 01, 1975    ADMISSION DATE:  09/28/2012  REFERRING MD : Elliot Cousin   BRIEF PATIENT DESCRIPTION: 76 YoM With a history of hodgkin lymphoma and is currently undergoing chemotherapy presented to 2/3 to [facility] ED with a 3 day history of fever/chills, and weakness and dyspnea. Dyspnea continued to worsened with increased hypoxia, CXR showed diffuse & progressive bilateral infiltrates, also found to have a sm. RLL PE. Condition continued to deteriorate. Transferred to [facility] 2/5, is in acute respiratory failure.   SIGNIFICANT EVENTS / STUDIES:  2/5 - Transferred to [facility] MICU  2/5 LEUS: Right popliteal DVT   LINES / TUBES:  Power Port - Left Chest Wall   CULTURES:  Blood CX 2/3 >>> neg to date resp viral panel 2/5 >>> pending CMV 2/5>>> in process EPV 2/5>>> in process RSV 2/5>>>  Ustrep 2/5>>> neg u legionella 2/5>>> neg MRSA 2/5 - NEG   ANTIBIOTICS:  Bactrim 2/4 >>> ID will d/c pending on LDH results Vanc 2/5 >>>  Imipenem 2/5 >>>  levaquin 2/5 >>>  Tamilflu 2/5 >>>   VITAL SIGNS: Temp:  [97.4 F (36.3 C)-99.1 F (37.3 C)] 97.7 F (36.5 C) (02/06 1300) Pulse Rate:  [64-99] 78  (02/06 1400) Resp:  [15-31] 16  (02/06 1400) BP: (89-119)/(45-72) 106/59 mmHg (02/06 1400) SpO2:  [92 %-100 %] 99 % (02/06 1400) FiO2 (%):  [50 %-80 %] 50 % (02/06 1240) Weight:  [199 lb 8.3 oz (90.5 kg)] 199 lb 8.3 oz (90.5 kg) (02/06 0500)    VENTILATOR SETTINGS: Vent Mode:  [-]  FiO2 (%):  [50 %-80 %] 50 %  INTAKE / OUTPUT: Intake/Output      02/05 0701 - 02/06 0700 02/06 0701 - 02/07 0700   P.O.     I.V. (mL/kg) 504.7 (5.6) 194.5 (2.1)   IV Piggyback 1493 500   Total Intake(mL/kg) 1997.7 (22.1) 694.5 (7.7)   Urine (mL/kg/hr) 3625 (1.7) 900 (1.3)   Total Output 3625 900   Net -1627.3 -205.5        Stool Occurrence 1 x      PHYSICAL EXAMINATION:  General: Ill appearing male with mild respiratory  distress.  Neuro: Awake alert and oriented x4.  HEENT: , PERRL  Cardiovascular: S1, S2 intact, no rubs, gallops, murmurs.  Lungs: Occasional bilateral rales.  Abdomen: Soft, non-tender, non-distended  Musculoskeletal: Full ROM  Skin: Intact  LABS:  Lab 10/01/12 0230 09/30/12 1850 09/30/12 1225 09/30/12 1206 09/30/12 0506 09/30/12 0258 09/29/12 1314 09/29/12 0537 09/28/12 0830 09/28/12 0739  HGB 9.5* -- -- 9.6* 9.8* -- -- -- -- --  WBC 7.0 -- -- 6.4 13.7* -- -- -- -- --  PLT 226 -- -- 208 183 -- -- -- -- --  NA -- -- -- 134* -- -- -- 136 -- 135  K -- -- -- 4.1 -- -- -- 4.3 -- --  CL -- -- -- 99 -- -- -- 104 -- 99  CO2 -- -- -- 25 -- -- -- 27 -- 27  GLUCOSE -- -- -- 123* -- -- -- 112* -- 122*  BUN -- -- -- 8 -- -- -- 8 -- 13  CREATININE -- -- -- 0.79 -- -- -- 0.98 -- 1.04  CALCIUM -- -- -- 8.8 -- -- -- 8.8 -- 8.9  MG -- -- -- -- -- -- -- -- -- --  PHOS -- -- -- -- -- -- -- -- -- --  AST -- -- -- 25 -- -- -- 24 -- 25  ALT -- -- -- 15 -- -- -- 20 -- 24  ALKPHOS -- -- -- 61 -- -- -- 62 -- 64  BILITOT -- -- -- 0.3 -- -- -- 0.5 -- 0.7  PROT -- -- -- 6.1 -- -- -- 5.8* -- 6.3  ALBUMIN -- -- -- 2.3* -- -- -- 2.7* -- 3.2*  APTT -- -- -- -- -- -- -- -- -- --  INR -- -- -- -- -- -- -- -- -- --  LATICACIDVEN -- -- -- 1.1 -- -- -- -- -- --  TROPONINI -- -- -- -- -- -- -- -- <0.30 --  PROCALCITON -- 0.51 -- -- -- -- -- -- -- --  PROBNP -- -- -- 82.6 41.5 -- -- -- 8.3 --  O2SATVEN -- -- -- -- -- -- -- -- -- --  PHART -- -- 7.450 -- -- 7.390 7.427 -- -- --  PCO2ART -- -- 33.4* -- -- 38.6 37.3 -- -- --  PO2ART -- -- 316.0* -- -- 64.1* 72.4* -- -- --    Lab 10/01/12 1240 10/01/12 0745 10/01/12 0404 09/30/12 2347 09/30/12 1959  GLUCAP 115* 154* 132* 129* 112*    CXR: Diffuse airspace and interstitial infiltration throughout both lungs. Infiltrative changes demonstrates some progression since the previous study. Central venous catheter is unchanged.  ASSESSMENT / PLAN:  PULMONARY  A:   1. Acute Respiratory Failure in setting diffuse pulmonary infiltrates. likely infectious,? PNA, ? Influenza ? ARDS ? Drug related pneumonitis.  2. PE  P:  1. On BiPAP 2. CXR tomorrow 3. solumedrol to 80 mg q8, remain empiric  4. See ID  6. Continue anticoagulation    CARDIOVASCULAR  A: Diastolic Dysfunction  P:  -Strict I & O  -Supportive Care  -may need echo assessment   RENAL  A: ARDS  P:  -Supportive Care, keep dry, appears can tolerate neg balance   GASTROINTESTINAL  A: distress  P:  -Supportive Care  -ppi  -LFT   HEMATOLOGIC  A:  1. PE/ RLL DVT  2. Chronic Anemia: Likely related to chemotherapy  3. hodgkin lymphoma and is currently undergoing chemotherapy.  P:  - heparin gtt, as may need procedures, if no procedures, then would like Lovenox in setting malignancy  - Daily Coags  - Daily CBC and transfuse for hgb < 7   INFECTIOUS  A: CAP (NOS): Worsening bilateral infiltrates on CXR with acute respiratory failure.  P:  -Empiric antibiotic coverage including atypical coverage due to immunocompromised state.  -Start: Imipenem, Vancomycin, Levaquin  -Start Tamiflu  -ID involved.   ENDOCRINE  A: At risk hyperglycemia  P:  Supportive Care  Add ssi   NEUROLOGIC  A: No Acute Issue  P:  -Supportive Care    I have personally obtained a history, examined the patient, evaluated laboratory and imaging results, formulated the assessment and plan and placed orders. CRITICAL CARE: The patient is critically ill with multiple organ systems failure and requires high complexity decision making for assessment and support, frequent evaluation and titration of therapies, application of advanced monitoring technologies and extensive interpretation of multiple databases. Critical Care Time devoted to patient care services described in this note is  minutes.    Pulmonary and Critical Care Medicine [facility] Pager: [phone]  10/01/2012, 2:52 PM

## 2012-10-01 NOTE — Progress Notes (Signed)
Patient currently on 5LNC with 02 saturations at 92%. Patient not yet ready to go on BIPAP, RT will come back and place patient on BIPAP. Will continue to monitor

## 2012-10-01 NOTE — Progress Notes (Signed)
Patient ID: Samuel [name], male   DOB: 24-Jul-1976, 37 y.o.   MRN: [id number]    [facility]    Date of Admission:  09/28/2012           Day 4 vancomycin        Day 4 levofloxacin        Day 3 trimethoprim sulfamethoxazole        Day 2 imipenem        Day 2 oseltamivir Principal Problem:  *HCAP (healthcare-associated pneumonia) Active Problems:  Acute respiratory failure with hypoxia  Hodgkin's disease  Thrombocytopenia  Anemia  Pleural effusion on right  Pulmonary emboli  Hyperglycemia  Former cigarette smoker  Weight gain due to medication      . albuterol  2.5 mg Nebulization Q4H WA  . antiseptic oral rinse  15 mL Mouth Rinse q12n4p  . benzonatate  100 mg Oral TID  . chlorhexidine  15 mL Mouth Rinse BID  . imipenem-cilastatin  500 mg Intravenous Q6H  . insulin aspart  0-9 Units Subcutaneous Q4H  . levofloxacin  750 mg Intravenous Q24H  . methylPREDNISolone (SOLU-MEDROL) injection  80 mg Intravenous Q8H  . oseltamivir  75 mg Oral BID  . pantoprazole (PROTONIX) IV  40 mg Intravenous QHS  . sodium chloride  10-40 mL Intracatheter Q12H  . sulfamethoxazole-trimethoprim  320 mg Intravenous Q6H  . vancomycin  1,250 mg Intravenous Q8H    Subjective: He states that he is feeling better. His cough has improved. He states that he feels like he needs to cough something up but can't. He is hungry. He felt comfortable off of his BiPAP for about an hour and a half this morning.  Objective: Temp:  [97.4 F (36.3 C)-99.1 F (37.3 C)] 97.6 F (36.4 C) (02/06 0833) Pulse Rate:  [64-99] 84  (02/06 1000) Resp:  [15-31] 21  (02/06 1000) BP: (97-123)/(45-67) 107/62 mmHg (02/06 1000) SpO2:  [92 %-100 %] 93 % (02/06 1000) FiO2 (%):  [60 %-100 %] 60 % (02/06 1000) Weight:  [90.5 kg (199 lb 8.3 oz)-90.8 kg (200 lb 2.8 oz)] 90.5 kg (199 lb 8.3 oz) (02/06 0500)  General: Alert and comfortable with BiPAP mask on Skin: No rash or palpable adenopathy Lungs: Clear  anteriorly Cor: Distant but regular S1 and S2 and no murmur Port-A-Cath: Site appears normal Abdomen: Soft and nontender  Lab Results Lab Results  Component Value Date   WBC 7.0 10/01/2012   HGB 9.5* 10/01/2012   HCT 27.7* 10/01/2012   MCV 81.2 10/01/2012   PLT 226 10/01/2012     Microbiology: Recent Results (from the past 240 hour(s))  CULTURE, BLOOD (ROUTINE X 2)     Status: Normal (Preliminary result)   Collection Time   09/28/12  8:48 AM      Component Value Range Status Comment   Specimen Description BLOOD LEFT ANTECUBITAL   Final    Special Requests     Final    Value: BOTTLES DRAWN AEROBIC AND ANAEROBIC AEB=8CC ANA=6CC   Culture NO GROWTH 3 DAYS   Final    Report Status PENDING   Incomplete   CULTURE, BLOOD (ROUTINE X 2)     Status: Normal (Preliminary result)   Collection Time   09/28/12  8:51 AM      Component Value Range Status Comment   Specimen Description BLOOD LEFT ANTECUBITAL   Final    Special Requests BOTTLES DRAWN AEROBIC AND ANAEROBIC 6CC   Final  Culture NO GROWTH 3 DAYS   Final    Report Status PENDING   Incomplete   MRSA PCR SCREENING     Status: Normal   Collection Time   09/30/12  4:30 AM      Component Value Range Status Comment   MRSA by PCR NEGATIVE  NEGATIVE Final     Studies/Results: Dg Chest Port 1 View  09/30/2012  *RADIOLOGY REPORT*  Clinical Data: Hypoxia.  Pulmonary embolus.  ARDS.  Hodgkin's lymphoma.  PORTABLE CHEST - 1 VIEW  Comparison: 09/28/2012  Findings: Shallow inspiration.  Cardiac enlargement with pulmonary vascular congestion.  Diffuse airspace and interstitial infiltration throughout both lungs.  Infiltrative changes demonstrates some progression since the previous study.  Central venous catheter is unchanged.  No blunting of costophrenic angles. No pneumothorax.  IMPRESSION: Cardiac enlargement and pulmonary vascular congestion.  Progression of diffuse airspace and interstitial disease since previous study.   Original Report Authenticated By:  Burman Nieves, M.D.     Assessment: He has defervesced since steroids were started and his antibiotics were broadened yesterday. His had no further deterioration in his respiratory status. His blood cultures are negative and so far we have very limited data on which to determine the etiology of his fever and pulmonary infiltrates. His viral respiratory panel results should be available later this evening. His serum LDH is pending. He is on very broad empiric antimicrobial therapy but I favor continuing his current regimen for now.  Plan: 1. Continue current antimicrobial regimen 2. Await results of viral respiratory panel and serum LDH 3. Continue droplet precautions  [name], MD [facility] [phone] pager   [phone] cell 10/01/2012, 11:33 AM

## 2012-10-02 ENCOUNTER — Inpatient Hospital Stay (HOSPITAL_COMMUNITY): Payer: PRIVATE HEALTH INSURANCE

## 2012-10-02 ENCOUNTER — Ambulatory Visit (HOSPITAL_COMMUNITY): Payer: PRIVATE HEALTH INSURANCE | Admitting: Oncology

## 2012-10-02 LAB — CBC
Platelets: 272 10*3/uL (ref 150–400)
RDW: 18.2 % — ABNORMAL HIGH (ref 11.5–15.5)
WBC: 17.4 10*3/uL — ABNORMAL HIGH (ref 4.0–10.5)

## 2012-10-02 LAB — BASIC METABOLIC PANEL
Calcium: 8.5 mg/dL (ref 8.4–10.5)
Creatinine, Ser: 0.83 mg/dL (ref 0.50–1.35)
GFR calc Af Amer: >90 mL/min (ref >90)

## 2012-10-02 LAB — HEPARIN LEVEL (UNFRACTIONATED): Heparin Unfractionated: 0.16 IU/mL — ABNORMAL LOW (ref 0.30–0.70)

## 2012-10-02 LAB — CMV IGM: CMV IgM: <8 AU/mL (ref <30.00)

## 2012-10-02 LAB — GLUCOSE, CAPILLARY: Glucose-Capillary: 137 mg/dL — ABNORMAL HIGH (ref 70–99)

## 2012-10-02 MED ORDER — CIPROFLOXACIN HCL 750 MG PO TABS
750.0000 mg | ORAL_TABLET | Freq: Two times a day (BID) | ORAL | Status: DC
  Administered 2012-10-02 – 2012-10-05 (×7): 750 mg via ORAL
  Filled 2012-10-02 (×9): qty 1

## 2012-10-02 MED ORDER — HEPARIN BOLUS VIA INFUSION
1500.0000 [IU] | Freq: Once | INTRAVENOUS | Status: AC
  Administered 2012-10-02: 1500 [IU] via INTRAVENOUS
  Filled 2012-10-02: qty 1500

## 2012-10-02 NOTE — Progress Notes (Signed)
ANTICOAGULATION / ANTIBIOTIC CONSULT NOTE - Follow Up Consult  Pharmacy Consult:  Heparin + Vancomycin / Septra  Indication:  Pulmonary embolus + r/o PNA/PCP  Allergies  Allergen Reactions  . Amoxicillin Rash    Patient Measurements: Height: 5\' 6"  (167.6 cm) Weight: 206 lb 12.7 oz (93.8 kg) IBW/kg (Calculated) : 63.8  Heparin Dosing Weight: 83 kg   Vital Signs: Temp: 98.2 F (36.8 C) (02/07 1209) Temp src: Oral (02/07 1209) BP: 101/54 mmHg (02/07 1300) Pulse Rate: 88  (02/07 1300)  Labs:  Basename 10/02/12 1130 10/02/12 0430 10/01/12 0800 10/01/12 0230 09/30/12 1206  HGB -- 8.8* -- 9.5* --  HCT -- 25.9* -- 27.7* 28.6*  PLT -- 272 -- 226 208  APTT -- -- -- -- --  LABPROT -- -- -- -- --  INR -- -- -- -- --  HEPARINUNFRC 0.46 0.16* 0.36 -- --  CREATININE -- 0.83 -- -- 0.79  CKTOTAL -- -- -- -- --  CKMB -- -- -- -- --  TROPONINI -- -- -- -- --    Estimated Creatinine Clearance: 131.9 ml/min (by C-G formula based on Cr of 0.83).      Assessment: 50 YOM admitted to [facility] with chief complaint of a 3-day history of SOB and weakness, and was transferred to [facility] on 09/30/12 for further management.  Notably, he has Hodgkin's Lymphoma and is currently undergoing chemotherapy Q 28 days.  Pharmacy consulted to manage IV heparin for RLL PE.  Heparin level is therapeutic post rate increase; no bleeding reported.  Pharmacy also managing vancomycin for r/o PNA/sepsis, and Septra for rule out PCP in setting of immunosuppression.  Patient's renal function is stable and her vancomycin trough is now therapeutic.   2/3: Vanc >>  2/3: Levaquin >> 2/7 2/4: TMP/SMX >>  2/5: Primaxin >> 2/7 2/5 Tamiflu >> 2/7 2/7 Cipro >>  2/3: Resp virus panel: negative 2/3: Blood - NGTD   Goal of Therapy:  Heparin level 0.3-0.7 units/ml Monitor platelets by anticoagulation protocol: Yes    Plan:  - Continue heparin gtt at 1650 units/hr - Daily HL and CBC - Continue TMP/SMX at 320mg  IV Q8H (~15  mg/kg/day)  - Continue Vanc 1500mg  IV Q8H - Cipro 750mg  PO BID as ordered - Will monitor C/S, renal fxn, clinical status, recheck trough in 3-5 days to r/o accumulation     Samuel [name] D. Samuel [name], PharmD, BCPS Pager:  [phone] 10/02/2012, 1:23 PM

## 2012-10-02 NOTE — Progress Notes (Signed)
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  Transferred to [facility] 2/05 for further therapy.  Significant PMHx of Diastolic CHF, b/l hearing loss  SIGNIFICANT EVENTS:  2/5 - Transferred to [facility] MICU 2/07 Transfer to SDU  TESTS: 09/07/12 PFT >> FEV1 3.64 (87%), FEV1% 84, TLC 6.47 (96%), DLCO 77% 09/29/12 CT chest >> RLL PE, diffuse b/l GGO and alveolar ASD, decreased LAN 09/30/12 Doppler legs >> acute Rt popliteal vein DVT 09/30/12 Echo >> EF 55 to 60%, PAS 35 mmHg  LINES / TUBES: Power Port - Left Chest Wall  CULTURES: Blood CX 2/3 >>> resp viral panel  2/5 >>> CMV 2/5>>> EPV 2/5>>> RSV 2/5>>>negative Ustrep 2/5>>>negative u legionella 2/5>>>negative MRSA 2/5 - NEG  ANTIBIOTICS: Bactrim 2/4 >>> Vanc 2/5 >>> Imipenem 2/5 >>> levaquin 2/5 >>> Tamilflu 2/5 >>>  SUBJECTIVE:  Transitioned to nasal cannula.  Feels hungry.  VITAL SIGNS: Temp:  [97.2 F (36.2 C)-98.3 F (36.8 C)] 97.2 F (36.2 C) (02/07 0840) Pulse Rate:  [60-93] 77  (02/07 0800) Resp:  [16-29] 22  (02/07 0800) BP: (89-116)/(40-72) 93/50 mmHg (02/07 0800) SpO2:  [88 %-100 %] 98 % (02/07 0800) FiO2 (%):  [50 %-60 %] 50 % (02/07 0755) Weight:  [206 lb 12.7 oz (93.8 kg)] 206 lb 12.7 oz (93.8 kg) (02/07 0500) HEMODYNAMICS:   VENTILATOR SETTINGS: Vent Mode:  [-]  FiO2 (%):  [50 %-60 %] 50 % INTAKE / OUTPUT: Intake/Output      02/06 0701 - 02/07 0700 02/07 0701 - 02/08 0700   I.V. (mL/kg) 768.1 (8.2) 36.5 (0.4)   IV Piggyback 1500 150   Total Intake(mL/kg) 2268.1 (24.2) 186.5  (2)   Urine (mL/kg/hr) 2725 (1.2) 400   Total Output 2725 400   Net -456.9 -213.5          PHYSICAL EXAMINATION: General: No distress Neuro: Normal strength HEENT: No JVP Cardiovascular: s1s2 regular, no murmur Lungs: b/l rhonchi, basilar rales Abdomen:  Soft, non-tender, non-distended Musculoskeletal: no edema Skin: no rashes  LABS:  Lab 10/02/12 0430 10/01/12 0230 09/30/12 1850 09/30/12 1225 09/30/12 1206 09/30/12 0506 09/30/12 0258 09/29/12 1314 09/29/12 0537 09/28/12 0830 09/28/12 0739  HGB 8.8* 9.5* -- -- 9.6* -- -- -- -- -- --  WBC 17.4* 7.0 -- -- 6.4 -- -- -- -- -- --  PLT 272 226 -- -- 208 -- -- -- -- -- --  NA 136 -- -- -- 134* -- -- -- 136 -- --  K 4.4 -- -- -- 4.1 -- -- -- -- -- --  CL 104 -- -- -- 99 -- -- -- 104 -- --  CO2 24 -- -- -- 25 -- -- -- 27 -- --  GLUCOSE 135* -- -- -- 123* -- -- -- 112* -- --  BUN 15 -- -- -- 8 -- -- -- 8 -- --  CREATININE 0.83 -- -- -- 0.79 -- -- -- 0.98 -- --  CALCIUM 8.5 -- -- -- 8.8 -- -- -- 8.8 -- --  MG -- -- -- -- -- -- -- -- -- -- --  PHOS -- -- -- -- -- -- -- -- -- -- --  AST -- -- -- -- 25 -- -- -- 24 -- 25  ALT -- -- -- -- 15 -- -- -- 20 -- 24  ALKPHOS -- -- -- -- 61 -- -- -- 62 -- 64  BILITOT -- -- -- -- 0.3 -- -- -- 0.5 -- 0.7  PROT -- -- -- -- 6.1 -- -- -- 5.8* -- 6.3  ALBUMIN -- -- -- -- 2.3* -- -- -- 2.7* -- 3.2*  APTT -- -- -- -- -- -- -- -- -- -- --  INR -- -- -- -- -- -- -- -- -- -- --  LATICACIDVEN -- -- -- -- 1.1 -- -- -- -- -- --  TROPONINI -- -- -- -- -- -- -- -- -- <0.30 --  PROCALCITON -- -- 0.51 -- -- -- -- -- -- -- --  PROBNP -- -- -- -- 82.6 41.5 -- -- -- 8.3 --  O2SATVEN -- -- -- -- -- -- -- -- -- -- --  PHART -- -- -- 7.450 -- -- 7.390 7.427 -- -- --  PCO2ART -- -- -- 33.4* -- -- 38.6 37.3 -- -- --  PO2ART -- -- -- 316.0* -- -- 64.1* 72.4* -- -- --    Lab 10/02/12 0806 10/02/12 0302 10/01/12 2318 10/01/12 2016 10/01/12 1548  GLUCAP 137* 133* 144* 123* 167*     Imaging: Dg Chest Port 1  View  10/02/2012  *RADIOLOGY REPORT*  Clinical Data: Follow up pulmonary infiltrates  PORTABLE CHEST - 1 VIEW  Comparison: Prior chest x-ray 09/30/2012  Findings: Similar to incrementally improved appearance of bilateral central interstitial and airspace opacities worse on the left than the right.  May be slightly improved aeration in the right mid lung.  Stable cardiomegaly.  Left subclavian approach portacatheter in unchanged position with catheter tip projecting over the distal SVC.  No new acute abnormality.  IMPRESSION:  Similar to incrementally improved bilateral interstitial and airspace opacities consistent with ARDS, atypical infection, pulmonary alveolar hemorrhage, or pneumonitis.   Original Report Authenticated By: Malachy Moan, M.D.      ASSESSMENT / PLAN:  PULMONARY A: Acute respiratory failure 2nd to pulmonary infiltrates >> ?infection vs drug reaction (bleomycin) vs inflammatory. Improved 2/07. P:   Continue solumedrol 80 mg q8h for now Need to minimize FiO2 as tolerate with concern for bleomycin toxicity F/u CXR BiPAP as needed  Defer bronchoscopy unless intubated  CARDIOVASCULAR A: Acute RLL PE and Rt leg DVT. Chronic Diastolic Dysfunction. P:  Heparin gtt until more stable Monitor hemodynamics Goal even to negative fluid balance  RENAL A: No issues. P:   Monitor renal fx, urine outpt F/u and replace electrolytes as needed  GASTROINTESTINAL A: Nutrition. P:   Start CHO modified diet   HEMATOLOGIC A: Anemia of chronic disease and critical illness. Hodgkin's lymphoma. P:  F/u CBC Transfuse for Hb < 7  INFECTIOUS A:  B/l diffuse pulmonary infiltrates with fever >> concern for pneumonia in immunosuppressed pt (Lymphoma s/p chemo). P:   Abx, anti-virals per ID pending cx results   ENDOCRINE A:  Steroid induced hyperglycemia. P:   SSI  NEUROLOGIC A:  No Acute Issue P:   Monitor  Updated wife at bedside.  Transfer to SDU 2/07 >> keep on PCCM  service.  Coralyn Helling, MD [facility] Pulmonary/Critical Care 10/02/2012, 9:58 AM Pager:  [phone] After 3pm call: [phone]

## 2012-10-02 NOTE — Consult Note (Signed)
[facility]    Date of Admission:  09/28/2012    Day 5 vancomycin        Day 5 levofloxacin        Day 4 trimethoprim sulfamethoxazole        Day 3 imipenem        Day 3 oseltamivir Principal Problem:  *HCAP (healthcare-associated pneumonia) Active Problems:  Acute respiratory failure with hypoxia  Hodgkin's disease  Thrombocytopenia  Anemia  Pleural effusion on right  Pulmonary emboli  Hyperglycemia  Former cigarette smoker  Weight gain due to medication      . albuterol  2.5 mg Nebulization Q4H WA  . antiseptic oral rinse  15 mL Mouth Rinse q12n4p  . chlorhexidine  15 mL Mouth Rinse BID  . imipenem-cilastatin  500 mg Intravenous Q6H  . insulin aspart  0-9 Units Subcutaneous Q4H  . levofloxacin  750 mg Intravenous Q24H  . methylPREDNISolone (SOLU-MEDROL) injection  80 mg Intravenous Q8H  . oseltamivir  75 mg Oral BID  . pantoprazole (PROTONIX) IV  40 mg Intravenous QHS  . sodium chloride  10-40 mL Intracatheter Q12H  . sulfamethoxazole-trimethoprim  320 mg Intravenous Q6H  . vancomycin  1,500 mg Intravenous Q8H    Subjective: He is feeling better with less shortness of breath and cough.  Objective: Temp:  [97.2 F (36.2 C)-98.3 F (36.8 C)] 97.2 F (36.2 C) (02/07 0840) Pulse Rate:  [60-93] 77  (02/07 0800) Resp:  [16-29] 22  (02/07 0800) BP: (89-116)/(40-72) 93/50 mmHg (02/07 0800) SpO2:  [88 %-100 %] 98 % (02/07 0800) FiO2 (%):  [50 %] 50 % (02/07 0755) Weight:  [93.8 kg (206 lb 12.7 oz)] 93.8 kg (206 lb 12.7 oz) (02/07 0500)  General: He is comfortable with no increased work of breathing when talking. He is on 5 L nasal cannula O2 Skin: No rash Lungs: Clear Cor: Regular S1 and S2 no murmurs  Lab Results Lab Results  Component Value Date   WBC 17.4* 10/02/2012   HGB 8.8* 10/02/2012   HCT 25.9* 10/02/2012   MCV 82.2 10/02/2012   PLT 272 10/02/2012    Serum LDH 328  Microbiology: Recent Results (from the past 240 hour(s))    CULTURE, BLOOD (ROUTINE X 2)     Status: Normal (Preliminary result)   Collection Time   09/28/12  8:48 AM      Component Value Range Status Comment   Specimen Description BLOOD LEFT ANTECUBITAL   Final    Special Requests     Final    Value: BOTTLES DRAWN AEROBIC AND ANAEROBIC AEB=8CC ANA=6CC   Culture NO GROWTH 4 DAYS   Final    Report Status PENDING   Incomplete   CULTURE, BLOOD (ROUTINE X 2)     Status: Normal (Preliminary result)   Collection Time   09/28/12  8:51 AM      Component Value Range Status Comment   Specimen Description BLOOD LEFT ANTECUBITAL   Final    Special Requests BOTTLES DRAWN AEROBIC AND ANAEROBIC 6CC   Final    Culture NO GROWTH 4 DAYS   Final    Report Status PENDING   Incomplete   MRSA PCR SCREENING     Status: Normal   Collection Time   09/30/12  4:30 AM      Component Value Range Status Comment   MRSA by PCR NEGATIVE  NEGATIVE Final   RESPIRATORY VIRUS PANEL     Status: Normal  Collection Time   09/30/12  3:14 PM      Component Value Range Status Comment   Source - RVPAN NASAL SWAB   Corrected CORRECTED ON 02/06 AT 2219: PREVIOUSLY REPORTED AS NASAL SWAB   Respiratory Syncytial Virus A NOT DETECTED   Final    Respiratory Syncytial Virus B NOT DETECTED   Final    Influenza A NOT DETECTED   Final    Influenza B NOT DETECTED   Final    Parainfluenza 1 NOT DETECTED   Final    Parainfluenza 2 NOT DETECTED   Final    Parainfluenza 3 NOT DETECTED   Final    Metapneumovirus NOT DETECTED   Final    Rhinovirus NOT DETECTED   Final    Adenovirus NOT DETECTED   Final    Influenza A H1 NOT DETECTED   Final    Influenza A H3 NOT DETECTED   Final     Studies/Results: Dg Chest Port 1 View  10/02/2012  *RADIOLOGY REPORT*  Clinical Data: Follow up pulmonary infiltrates  PORTABLE CHEST - 1 VIEW  Comparison: Prior chest x-ray 09/30/2012  Findings: Similar to incrementally improved appearance of bilateral central interstitial and airspace opacities worse on the left than  the right.  May be slightly improved aeration in the right mid lung.  Stable cardiomegaly.  Left subclavian approach portacatheter in unchanged position with catheter tip projecting over the distal SVC.  No new acute abnormality.  IMPRESSION:  Similar to incrementally improved bilateral interstitial and airspace opacities consistent with ARDS, atypical infection, pulmonary alveolar hemorrhage, or pneumonitis.   Original Report Authenticated By: Malachy Moan, M.D.     Assessment: They're still no definite etiology for his fever or and pulmonary infiltrates. All of his viral respiratory panel PCR's are negative so we can discontinue oseltamivir and take him out of droplet isolation for possible influenza. Also, I do not think he needs the broad anaerobic activity of imipenem. I will consolidate imipenem and levofloxacin to oral ciprofloxacin. I cannot rule out the possibility of pneumocystis and since his serum LDH is elevated (it had been normal last month when he was reevaluated for his lymphoma) I will continue trimethoprim sulfamethoxazole. I will also continue empiric vancomycin.  Plan: 1. Continue vancomycin and trimethoprim sulfamethoxazole 2. Start oral ciprofloxacin 3. Discontinue levofloxacin, imipenem and oseltamivir 4. Discontinue droplet precautions 5. Continue steroids  [name], MD [facility] for Infectious Disease [facility] [phone] pager   [phone] cell 10/02/2012, 11:40 AM

## 2012-10-02 NOTE — Progress Notes (Signed)
ANTICOAGULATION CONSULT NOTE - Follow Up Consult  Pharmacy Consult for Heparin Indication: pulmonary embolus  Allergies  Allergen Reactions  . Amoxicillin Rash    Patient Measurements: Height: 5\' 6"  (167.6 cm) Weight: 199 lb 8.3 oz (90.5 kg) IBW/kg (Calculated) : 63.8  Heparin Dosing Weight: 83 Kg  Vital Signs: Temp: 97.2 F (36.2 C) (02/07 0405) Temp src: Oral (02/07 0405) BP: 106/58 mmHg (02/07 0323) Pulse Rate: 67  (02/07 0323)  Labs:  Basename 10/02/12 0430 10/01/12 0800 10/01/12 0230 10/01/12 0151 09/30/12 1206 09/29/12 0537  HGB 8.8* -- 9.5* -- -- --  HCT 25.9* -- 27.7* -- 28.6* --  PLT 272 -- 226 -- 208 --  APTT -- -- -- -- -- --  LABPROT -- -- -- -- -- --  INR -- -- -- -- -- --  HEPARINUNFRC 0.16* 0.36 -- 0.46 -- --  CREATININE -- -- -- -- 0.79 0.98  CKTOTAL -- -- -- -- -- --  CKMB -- -- -- -- -- --  TROPONINI -- -- -- -- -- --    Estimated Creatinine Clearance: 134.5 ml/min (by C-G formula based on Cr of 0.79).   Medications:  Prescriptions prior to admission  Medication Sig Dispense Refill  . DACARBAZINE IV Inject into the vein every 28 (twenty-eight) days. Days 1, 15 every 28 days      . dexamethasone (DECADRON) 4 MG tablet Take 8 mg by mouth. Starting the day after chemo, take 2 tablets in the am and 2 tablets in the pm for 3 days.      Marland Kitchen DOXOrubicin HCl (ADRIAMYCIN IV) Inject into the vein every 28 (twenty-eight) days. Days 1, 15 every 28 days      . lidocaine-prilocaine (EMLA) cream Apply topically as needed. Apply a quarter sized amount to port site 1 hour prior to chemo. Do not rub in. Cover with plastic.      . Multiple Vitamin (MULTIVITAMIN) tablet Take 1 tablet by mouth daily.      . ondansetron (ZOFRAN) 8 MG tablet Take 8 mg by mouth. Starting the day after chemo, take 1 tablet in the am and 1 tablet in the pm for 3 days. Then may take 1 tablet two times a day IF needed for nausea/vomiting. Side Effect: constipation      . potassium chloride SA  (K-DUR,KLOR-CON) 20 MEQ tablet Take 20 mEq by mouth daily.      . Ranitidine HCl (ZANTAC PO) Take 1 capsule by mouth as needed.      . simethicone (MYLICON) 80 MG chewable tablet Chew 80 mg by mouth every 6 (six) hours as needed. For gas      . sodium chloride 0.9 % SOLN 50 mL with bleomycin 30 UNITS SOLR Inject into the vein every 28 (twenty-eight) days. Days 1,15 every 28 days  (do not remove this medication from patient's profile even when he is done with chemo)!!! Medical staff need to know that this patient has received this drug if he is to undergo anesthesia (drug starts on 06/09/12)!!!!      . sodium chloride 0.9 % SOLN 50 mL with vinBLAStine 1 MG/ML SOLN Inject into the vein every 28 (twenty-eight) days. Days 1, 15 every 28 days          Labs:  [facility] 10/02/12 0430 10/01/12 0230 09/30/12 1206 09/29/12 0537  WBC 17.4* 7.0 6.4 --  HGB 8.8* 9.5* 9.6* --  PLT 272 226 208 --  LABCREA -- -- -- --  CREATININE -- --  0.79 0.98   Estimated Creatinine Clearance: 134.5 ml/min (by C-G formula based on Cr of 0.79).   Assessment: 37 yo male with Hodgkin's Lymphoma currently undergoing chemotherapy on IV heparin for RLL PE.   Heparin level (0.16) is below-goal on 1350 units/hr. No problem with line / infusion and no bleeding per RN.  Goal of Therapy:  Heparin level 0.3-0.7 units/ml Monitor platelets by anticoagulation protocol: Yes  Plan: . 1. Heparin IV bolus 1500 units x 1, then increase IV heparin to 1650 units/hr.  2. Heparin level in 6 hours to confirm dosing.   Lorre Munroe, PharmD 10/02/2012 5:21 AM

## 2012-10-02 NOTE — Progress Notes (Signed)
Pt removed from BIPAP for breathing treatment and placed back on 5L nasal cannula after breathing treatment.  Pt tolerating well at this time.  RT will continue to monitor.

## 2012-10-02 NOTE — Progress Notes (Signed)
Call to Clydie Braun in Infection Prevention; reviewed labwork together and Clydie Braun determined pt does not need to be on droplet precautions.  She stated nobody should be kissing pt due to EBV; wife & mother notified.

## 2012-10-03 ENCOUNTER — Ambulatory Visit (HOSPITAL_COMMUNITY): Payer: PRIVATE HEALTH INSURANCE

## 2012-10-03 DIAGNOSIS — C8589 Other specified types of non-Hodgkin lymphoma, extranodal and solid organ sites: Secondary | ICD-10-CM

## 2012-10-03 LAB — BASIC METABOLIC PANEL
CO2: 24 mEq/L (ref 19–32)
Chloride: 100 mEq/L (ref 96–112)
Creatinine, Ser: 0.84 mg/dL (ref 0.50–1.35)
Sodium: 134 mEq/L — ABNORMAL LOW (ref 135–145)

## 2012-10-03 LAB — GLUCOSE, CAPILLARY
Glucose-Capillary: 140 mg/dL — ABNORMAL HIGH (ref 70–99)
Glucose-Capillary: 192 mg/dL — ABNORMAL HIGH (ref 70–99)

## 2012-10-03 LAB — CBC
HCT: 26 % — ABNORMAL LOW (ref 39.0–52.0)
Hemoglobin: 9 g/dL — ABNORMAL LOW (ref 13.0–17.0)
MCV: 82.3 fL (ref 78.0–100.0)
RBC: 3.16 MIL/uL — ABNORMAL LOW (ref 4.22–5.81)
WBC: 15.5 10*3/uL — ABNORMAL HIGH (ref 4.0–10.5)

## 2012-10-03 LAB — HEPARIN LEVEL (UNFRACTIONATED): Heparin Unfractionated: 0.63 IU/mL (ref 0.30–0.70)

## 2012-10-03 MED ORDER — HEPARIN (PORCINE) IN NACL 100-0.45 UNIT/ML-% IJ SOLN
1550.0000 [IU]/h | INTRAMUSCULAR | Status: DC
  Administered 2012-10-05: 1550 [IU]/h via INTRAVENOUS
  Filled 2012-10-03 (×5): qty 250

## 2012-10-03 MED ORDER — PANTOPRAZOLE SODIUM 40 MG PO TBEC
40.0000 mg | DELAYED_RELEASE_TABLET | Freq: Every day | ORAL | Status: DC
  Administered 2012-10-03 – 2012-10-05 (×3): 40 mg via ORAL
  Filled 2012-10-03 (×2): qty 1

## 2012-10-03 MED ORDER — METHYLPREDNISOLONE SODIUM SUCC 125 MG IJ SOLR
80.0000 mg | Freq: Two times a day (BID) | INTRAMUSCULAR | Status: DC
  Administered 2012-10-03 – 2012-10-05 (×4): 80 mg via INTRAVENOUS
  Filled 2012-10-03 (×7): qty 1.28

## 2012-10-03 NOTE — Progress Notes (Addendum)
INFECTIOUS DISEASE PROGRESS NOTE  ID: Samuel [name] is a 37 y.o. male with  Principal Problem:   HCAP (healthcare-associated pneumonia) Active Problems:   Hodgkin's disease   Thrombocytopenia   Anemia   Pleural effusion on right   Pulmonary emboli   Acute respiratory failure with hypoxia   Hyperglycemia   Former cigarette smoker   Weight gain due to medication  Subjective: Without complaints, awakens easily.   Abtx:  Anti-infectives   Start     Dose/Rate Route Frequency Ordered Stop   10/02/12 1200  ciprofloxacin (CIPRO) tablet 750 mg     750 mg Oral 2 times daily 10/02/12 1147     10/01/12 1200  vancomycin (VANCOCIN) 1,500 mg in sodium chloride 0.9 % 500 mL IVPB     1,500 mg 250 mL/hr over 120 Minutes Intravenous Every 8 hours 10/01/12 1153     09/30/12 1300  imipenem-cilastatin (PRIMAXIN) 500 mg in sodium chloride 0.9 % 100 mL IVPB  Status:  Discontinued     500 mg 200 mL/hr over 30 Minutes Intravenous 4 times per day 09/30/12 1244 10/02/12 1147   09/30/12 1215  oseltamivir (TAMIFLU) capsule 75 mg  Status:  Discontinued     75 mg Oral 2 times daily 09/30/12 1215 10/02/12 1147   09/29/12 1800  vancomycin (VANCOCIN) 1,250 mg in sodium chloride 0.9 % 250 mL IVPB  Status:  Discontinued     1,250 mg 166.7 mL/hr over 90 Minutes Intravenous Every 8 hours 09/29/12 1321 10/01/12 1153   09/29/12 1800  sulfamethoxazole-trimethoprim (BACTRIM) 320 mg in dextrose 5 % 500 mL IVPB     320 mg 346.7 mL/hr over 90 Minutes Intravenous 4 times per day 09/29/12 1559     09/28/12 1800  vancomycin (VANCOCIN) IVPB 1000 mg/200 mL premix  Status:  Discontinued     1,000 mg 200 mL/hr over 60 Minutes Intravenous Every 8 hours 09/28/12 1121 09/29/12 1321   09/28/12 0930  vancomycin (VANCOCIN) IVPB 1000 mg/200 mL premix  Status:  Discontinued     1,000 mg 200 mL/hr over 60 Minutes Intravenous  Once 09/28/12 0924 09/28/12 1121   09/28/12 0915  levofloxacin (LEVAQUIN) IVPB 750 mg  Status:   Discontinued     750 mg 100 mL/hr over 90 Minutes Intravenous Every 24 hours 09/28/12 0908 10/02/12 1147      Medications:  Scheduled: . albuterol  2.5 mg Nebulization Q4H WA  . antiseptic oral rinse  15 mL Mouth Rinse q12n4p  . chlorhexidine  15 mL Mouth Rinse BID  . ciprofloxacin  750 mg Oral BID  . insulin aspart  0-9 Units Subcutaneous Q4H  . methylPREDNISolone (SOLU-MEDROL) injection  80 mg Intravenous Q12H  . pantoprazole  40 mg Oral QHS  . sodium chloride  10-40 mL Intracatheter Q12H  . sulfamethoxazole-trimethoprim  320 mg Intravenous Q6H  . vancomycin  1,500 mg Intravenous Q8H    Objective: Vital signs in last 24 hours: Temp:  [97.6 F (36.4 C)-98.2 F (36.8 C)] 98 F (36.7 C) (02/08 0749) Pulse Rate:  [56-96] 76 (02/08 1000) Resp:  [18-28] 19 (02/08 1000) BP: (94-109)/(42-58) 95/47 mmHg (02/08 1000) SpO2:  [89 %-100 %] 95 % (02/08 1000) FiO2 (%):  [50 %] 50 % (02/08 0400) Weight:  [93.1 kg (205 lb 4 oz)] 93.1 kg (205 lb 4 oz) (02/08 0500)   General appearance: alert, cooperative, fatigued and no distress Resp: diminished breath sounds bilaterally and mild Cardio: regular rate and rhythm GI: normal findings: bowel sounds  normal and soft, non-tender Extremities: edema none  Lab Results  Recent Labs  10/02/12 0430 10/03/12 0433  WBC 17.4* 15.5*  HGB 8.8* 9.0*  HCT 25.9* 26.0*  NA 136 134*  K 4.4 4.3  CL 104 100  CO2 24 24  BUN 15 17  CREATININE 0.83 0.84   Liver Panel  Recent Labs  09/30/12 1206  PROT 6.1  ALBUMIN 2.3*  AST 25  ALT 15  ALKPHOS 61  BILITOT 0.3   Sedimentation Rate No results found for this basename: ESRSEDRATE,  in the last 72 hours C-Reactive Protein No results found for this basename: CRP,  in the last 72 hours  Microbiology:   Studies/Results: Dg Chest Port 1 View  10/02/2012  *RADIOLOGY REPORT*  Clinical Data: Follow up pulmonary infiltrates  PORTABLE CHEST - 1 VIEW  Comparison: Prior chest x-ray 09/30/2012   Findings: Similar to incrementally improved appearance of bilateral central interstitial and airspace opacities worse on the left than the right.  May be slightly improved aeration in the right mid lung.  Stable cardiomegaly.  Left subclavian approach portacatheter in unchanged position with catheter tip projecting over the distal SVC.  No new acute abnormality.  IMPRESSION:  Similar to incrementally improved bilateral interstitial and airspace opacities consistent with ARDS, atypical infection, pulmonary alveolar hemorrhage, or pneumonitis.   Original Report Authenticated By: Malachy Moan, M.D.      Assessment/Plan: NHL Fever, Pulmonary Infiltrates Imipenem and levaquin consolidated to cipro yesterday He appears to be doing ok. Better per his family.  He is comfortable on [place] (although when sleeping his SpO2 was 90% with his mouth open. This quickly improved to 97% when he woke up). He continues on 5L.  Nasal influenza testing has been leading to false negatives this year (sampling error?) available if questions on 2-9  Day 6 vancomycin  Day 6 flouroquinolone (day 2 cipro) Day 5 trimethoprim sulfamethoxazole  Day 4 oseltamivir steroids   Johny Sax Infectious Diseases [phone] 10/03/2012, 10:23 AM   LOS: 5 days

## 2012-10-03 NOTE — Progress Notes (Signed)
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 Transferred to [facility] 2/05 for further therapy.  Significant PMHx of Diastolic CHF, b/l hearing loss   SIGNIFICANT EVENTS:  2/5 - Transferred to [facility] MICU  2/07 Transfer to SDU   TESTS:  09/07/12 PFT >> FEV1 3.64 (87%), FEV1% 84, TLC 6.47 (96%), DLCO 77%  09/29/12 CT chest >> RLL PE, diffuse b/l GGO and alveolar ASD, decreased LAN  09/30/12 Doppler legs >> acute Rt popliteal vein DVT  09/30/12 Echo >> EF 55 to 60%, PAS 35 mmHg   LINES / TUBES:  Power Port - Left Chest Wall  Peripheral IV  CULTURES:  Blood CX 2/3 >>> no growth  resp viral panel 2/5 >>> negative CMV 2/5>>> neg EPV 2/5>>> positive  RSV 2/5>>>negative  Ustrep 2/5>>>negative  u legionella 2/5>>>negative MRSA 2/5 - NEG   ANTIBIOTICS:  Bactrim 2/4 >>>  Vanc 2/5 >>>  Imipenem 2/5 >>> 2/7 levaquin 2/5 >>> 2/7 Tamilflu 2/5 >>> 2/7 Cipro 2/7 >>>  SUBJECTIVE:  On nasal cannula. No events overnight. Afebrile   VITAL SIGNS: Temp:  [97.2 F (36.2 C)-98.2 F (36.8 C)] 97.6 F (36.4 C) (02/08 0359) Pulse Rate:  [59-96] 59 (02/08 0500) Resp:  [18-28] 24 (02/08 0500) BP: (93-109)/(43-55) 108/52 mmHg (02/08 0500) SpO2:  [89 %-100 %] 99 % (02/08 0500) FiO2 (%):  [50 %] 50 % (02/08 0400)  HEMODYNAMICS:   VENTILATOR SETTINGS: Vent Mode:  [-]  FiO2 (%):  [50 %] 50 %  INTAKE / OUTPUT: Intake/Output     02/07 0701 - 02/08 0700   P.O. 620   I.V. (mL/kg) 726.5 (7.7)   IV Piggyback 1900   Total Intake(mL/kg) 3246.5 (34.6)   Urine (mL/kg/hr) 3550 (1.6)   Total Output 3550   Net -303.5        PHYSICAL EXAMINATION:  General: No distress   Neuro: Normal strength  HEENT: No JVP  Cardiovascular: s1s2 regular, no murmur  Lungs: b/l rhonchi, basilar rales  Abdomen: Soft, non-tender, non-distended  Musculoskeletal: no edema  Skin: no rashes   LABS:  Recent Labs Lab 09/28/12 0739 09/28/12 0830 09/29/12 0537 09/29/12 1314 09/30/12 0258 09/30/12 0506 09/30/12 1206 09/30/12 1225 09/30/12 1850 10/01/12 0230 10/02/12 0430 10/03/12 0433  HGB 10.1*  --  10.0*  --   --  9.8* 9.6*  --   --  9.5* 8.8* 9.0*  WBC 7.8  --  9.2  --   --  13.7* 6.4  --   --  7.0 17.4* 15.5*  PLT 124*  --  159  --   --  183 208  --   --  226 272 259  NA 135  --  136  --   --   --  134*  --   --   --  136 134*  K 3.6  --  4.3  --   --   --  4.1  --   --   --  4.4 4.3  CL 99  --  104  --   --   --  99  --   --   --  104 100  CO2 27  --  27  --   --   --  25  --   --   --  24 24  GLUCOSE 122*  --  112*  --   --   --  123*  --   --   --  135* 140*  BUN 13  --  8  --   --   --  8  --   --   --  15 17  CREATININE 1.04  --  0.98  --   --   --  0.79  --   --   --  0.83 0.84  CALCIUM 8.9  --  8.8  --   --   --  8.8  --   --   --  8.5 8.9  AST 25  --  24  --   --   --  25  --   --   --   --   --   ALT 24  --  20  --   --   --  15  --   --   --   --   --   ALKPHOS 64  --  62  --   --   --  61  --   --   --   --   --   BILITOT 0.7  --  0.5  --   --   --  0.3  --   --   --   --   --   PROT 6.3  --  5.8*  --   --   --  6.1  --   --   --   --   --   ALBUMIN 3.2*  --  2.7*  --   --   --  2.3*  --   --   --   --   --   LATICACIDVEN  --   --   --   --   --   --  1.1  --   --   --   --   --   TROPONINI  --  <0.30  --   --   --   --   --   --   --   --   --   --   PROCALCITON  --   --   --   --   --   --   --   --  0.51  --   --   --   PROBNP  --  8.3  --   --   --  41.5 82.6  --   --   --   --   --   PHART  --   --   --  7.427 7.390  --   --  7.450  --   --   --   --   PCO2ART  --   --   --  37.3 38.6  --   --  33.4*  --   --   --   --   PO2ART  --   --   --   72.4* 64.1*  --   --  316.0*  --   --   --   --     Recent Labs Lab 10/02/12 1140 10/02/12 1613 10/02/12 1954 10/03/12 0002 10/03/12 0359  GLUCAP 108* 117* 140* 134* 140*   Imaging:  Dg Chest Port 1 View  10/02/2012 *RADIOLOGY REPORT* Clinical  Data: Follow up pulmonary infiltrates PORTABLE CHEST - 1 VIEW Comparison: Prior chest x-ray 09/30/2012 Findings: Similar to incrementally improved appearance of bilateral central interstitial and airspace opacities worse on the left than the right. May be slightly improved aeration in the right mid lung. Stable cardiomegaly. Left subclavian approach portacatheter in unchanged position with catheter tip projecting over the distal SVC. No new acute abnormality. IMPRESSION: Similar to incrementally improved bilateral interstitial and airspace opacities consistent with ARDS, atypical infection, pulmonary alveolar hemorrhage, or pneumonitis. Original Report Authenticated By: Malachy Moan, M.D.   ASSESSMENT / PLAN:   PULMONARY  A: Acute respiratory failure 2nd to pulmonary infiltrates >> ?infection vs drug reaction (bleomycin) vs inflammatory.  Improved 2/07.  P:  Changed to solumedrol 80 mg q12h for now  Need to minimize FiO2 as tolerate with concern for bleomycin toxicity  F/u CXR  Defer bronchoscopy as clinical improvement  CARDIOVASCULAR  A: Acute RLL PE and Rt leg DVT.  Chronic Diastolic Dysfunction.  P:  Heparin gtt until more stable  Monitor hemodynamics  Goal even to negative fluid balance   RENAL  A: No issues.  P:  Monitor renal fx, urine outpt  F/u and replace electrolytes as needed   GASTROINTESTINAL  A: Nutrition.  P:  CHO modified diet   HEMATOLOGIC  A: Anemia of chronic disease and critical illness.  Hodgkin's lymphoma.  P:  F/u CBC  Transfuse for Hb < 7   INFECTIOUS  A: B/l diffuse pulmonary infiltrates with fever >> concern for pneumonia in immunosuppressed pt (Lymphoma s/p chemo).  P:  Abx adjusted per  ID  ENDOCRINE  A: Steroid induced hyperglycemia.  P:  SSI   NEUROLOGIC  A: No Acute Issue  P:  Monitor  Pulmonary and Critical Care Medicine [facility] Pager: [phone]  10/03/2012, 5:38 AM  Reviewed above, examined pt, and agree with assessment/plan.  Continues to improve.  D/c BiPAP.  Change solumedrol to 80 mg q12h.  Abx per ID.  F/u 2 view CXR 2/9.  Transfer to telemetry.  Keep on PCCM service.  Updated family at bedside.  Coralyn Helling, MD [facility] Pulmonary/Critical Care 10/03/2012, 10:53 AM Pager:  [phone] After 3pm call: [phone]

## 2012-10-03 NOTE — Progress Notes (Signed)
ANTICOAGULATION CONSULT NOTE - Follow Up Consult  Pharmacy Consult:  Heparin Indication:  Pulmonary embolus  Allergies  Allergen Reactions  . Amoxicillin Rash    Patient Measurements: Height: 5\' 6"  (167.6 cm) Weight: 205 lb 4 oz (93.1 kg) IBW/kg (Calculated) : 63.8 Heparin Dosing Weight: 83 kg   Vital Signs: Temp: 98 F (36.7 C) (02/08 0749) Temp src: Oral (02/08 0749) BP: 95/47 mmHg (02/08 1000) Pulse Rate: 76 (02/08 1000)  Labs:  Recent Labs  09/30/12 1206  10/01/12 0230  10/02/12 0430 10/02/12 1130 10/03/12 0433  HGB 9.6*  --  9.5*  --  8.8*  --  9.0*  HCT 28.6*  --  27.7*  --  25.9*  --  26.0*  PLT 208  --  226  --  272  --  259  HEPARINUNFRC  --   < >  --   < > 0.16* 0.46 0.63  CREATININE 0.79  --   --   --  0.83  --  0.84  < > = values in this interval not displayed.  Estimated Creatinine Clearance: 129.8 ml/min (by C-G formula based on Cr of 0.84).      Assessment: 57 YOM admitted to [facility] with chief complaint of a 3-day history of SOB and weakness, and was transferred to [facility] on 09/30/12 for further management.  Pharmacy consulted to manage IV heparin for RLL PE.  Heparin level therapeutic but is trending up; no bleeding reported.    Goal of Therapy:  Heparin level 0.3-0.7 units/ml Monitor platelets by anticoagulation protocol: Yes    Plan:  - Decrease heparin gtt to 1550 units/hr - Daily HL and CBC - Continue TMP/SMX at 320mg  IV Q8H (~15 mg/kg/day)  - Continue Vanc 1500mg  IV Q8H - Cipro 750mg  PO BID as ordered - Will monitor C/S, renal fxn, clinical status, recheck trough by mid-wk to r/o accumulation  - F/U initiation of long-term anticoagulation     Mitchelle Sultan D. Laney Potash, PharmD, BCPS Pager:  [phone] 10/03/2012, 11:39 AM

## 2012-10-04 ENCOUNTER — Inpatient Hospital Stay (HOSPITAL_COMMUNITY): Payer: PRIVATE HEALTH INSURANCE

## 2012-10-04 LAB — CBC
HCT: 25.8 % — ABNORMAL LOW (ref 39.0–52.0)
Hemoglobin: 8.8 g/dL — ABNORMAL LOW (ref 13.0–17.0)
MCH: 27.9 pg (ref 26.0–34.0)
MCV: 81.9 fL (ref 78.0–100.0)
RBC: 3.15 MIL/uL — ABNORMAL LOW (ref 4.22–5.81)

## 2012-10-04 LAB — GLUCOSE, CAPILLARY
Glucose-Capillary: 204 mg/dL — ABNORMAL HIGH (ref 70–99)
Glucose-Capillary: 166 mg/dL — ABNORMAL HIGH (ref 70–99)
Glucose-Capillary: 184 mg/dL — ABNORMAL HIGH (ref 70–99)
Glucose-Capillary: 155 mg/dL — ABNORMAL HIGH (ref 70–99)

## 2012-10-04 LAB — CULTURE, BLOOD (ROUTINE X 2)
Culture: NO GROWTH
Culture: NO GROWTH

## 2012-10-04 LAB — BASIC METABOLIC PANEL
CO2: 24 mEq/L (ref 19–32)
Chloride: 102 mEq/L (ref 96–112)
Glucose, Bld: 141 mg/dL — ABNORMAL HIGH (ref 70–99)
Potassium: 4.2 mEq/L (ref 3.5–5.1)
Sodium: 134 mEq/L — ABNORMAL LOW (ref 135–145)

## 2012-10-04 MED ORDER — SULFAMETHOXAZOLE-TMP DS 800-160 MG PO TABS
2.0000 | ORAL_TABLET | Freq: Four times a day (QID) | ORAL | Status: DC
  Administered 2012-10-04 – 2012-10-06 (×9): 2 via ORAL
  Filled 2012-10-04 (×16): qty 2

## 2012-10-04 NOTE — Progress Notes (Signed)
ANTICOAGULATION / ANTIBIOTIC CONSULT NOTE - Follow Up Consult  Pharmacy Consult:  Heparin + Vancomycin / Septra, new coumadin 2/9 ordered by Dr. Kriste Basque Indication:  Pulmonary embolus + r/o PNA/PCP  Allergies  Allergen Reactions  . Amoxicillin Rash    Patient Measurements: Height: 5\' 9"  (175.3 cm) Weight: 207 lb 0.2 oz (93.9 kg) IBW/kg (Calculated) : 70.7 Heparin Dosing Weight: 83 kg   Vital Signs: Temp: 98.3 F (36.8 C) (02/09 0352) Temp src: Oral (02/09 0352) BP: 120/62 mmHg (02/09 0352) Pulse Rate: 80 (02/09 0352)  Labs:  Recent Labs  10/02/12 0430 10/02/12 1130 10/03/12 0433 10/04/12 0535  HGB 8.8*  --  9.0* 8.8*  HCT 25.9*  --  26.0* 25.8*  PLT 272  --  259 249  HEPARINUNFRC 0.16* 0.46 0.63 0.49  CREATININE 0.83  --  0.84 0.85    Estimated Creatinine Clearance: 135.9 ml/min (by C-G formula based on Cr of 0.85).      Assessment: 21 YOM admitted to [facility] with chief complaint of a 3-day history of SOB and weakness, and was transferred to [facility] on 09/30/12 for further management.  Notably, he has Hodgkin's Lymphoma and is currently undergoing chemotherapy Q 28 days.  Pharmacy consulted to manage IV heparin for RLL PE.  Heparin level is therapeutic at 0.49 on 1550 units/hr; no bleeding reported.  Coumadin ordered today by Dr. Kriste Basque. In pts w/ VTE and cancer LMWH alone is rec for the first 3-6 months (also coumadin has a MAJOR drug interaction w/ Septra).  Dr. Kriste Basque said to cancel coumadin protocol for today, keep heparin and address LMWH on Monday w/ CCM.     Pharmacy also managing vancomycin for r/o PNA/sepsis, and Septra for rule out PCP in setting of immunosuppression.  Patient's renal function is stable and his vancomycin troug was therapeutic on2/7/14.  2/3: Vanc >>  2/3: Levaquin >> 2/7 2/4: TMP/SMX >>  2/5: Primaxin >> 2/7 2/5 Tamiflu >> 2/7 2/7 Cipro >>  2/3: Resp virus panel: negative 2/3: Blood - NGTD   Goal of Therapy:  Heparin level 0.3-0.7  units/ml Monitor platelets by anticoagulation protocol: Yes vanco trough 15-20 mcg/ml    Plan:  - Continue heparin gtt at 1550 units/hr -defer coumadin protocol today per Dr. Kriste Basque and f/u w/ CCM on Monday about changing to LMWH 1.5 mg/kg/day alone for VTE in cancer pt - Daily HL and CBC - Change  TMP/SMX to  320mg  po q6h = 2 Septra DS po q6h - could probably get away w/ 2 septra DS q8h - will defer to ID - Continue Vanc 1500mg  IV Q8H - Cipro 750mg  PO BID as ordered - Will monitor C/S, renal fxn, clinical status, recheck trough in 3-5 days to r/o accumulation   Herby Abraham, Pharm.D. [phone] 10/04/2012 11:57 AM

## 2012-10-04 NOTE — Progress Notes (Signed)
PULMONARY  / CRITICAL CARE MEDICINE  Name: Samuel [name] MRN: [id number] DOB: 1975-09-05    ADMISSION DATE:  09/28/2012  BRIEF  PATIENT DESCRIPTION: 37 yo male former smoker with hx of Nodular Sclerosing Hodgkin's Lymphoma on chemotherapy (ABVD) admitted to [facility] 2/03 with fever (Tmax 102F), chills, weakness, dyspnea, and hypoxia 2nd to b/l diffuse pulmonary infiltrates and RLL PE. Transferred to [facility] 2/05 for further therapy.  Significant PMHx of Diastolic CHF, b/l hearing loss    SIGNIFICANT EVENTS:  2/5 - Transferred to [facility] MICU  2/07 Transfer to SDU  2/08 Tx to 5505 telemetry  TESTS:  09/07/12 PFT >> FEV1 3.64 (87%), FEV1% 84, TLC 6.47 (96%), DLCO 77%  09/29/12 CT chest >> RLL PE, diffuse b/l GGO and alveolar ASD, decreased LAN  09/30/12 Doppler legs >> acute Rt popliteal vein DVT  09/30/12 Echo >> EF 55 to 60%, PAS 35 mmHg   LINES / TUBES:  Power Port - Left Chest Wall  Peripheral IV  CULTURES:  Blood CX 2/3 >>> no growth  resp viral panel 2/5 >>> negative CMV 2/5>>> neg EPV 2/5>>> positive  RSV 2/5>>>negative  Ustrep 2/5>>>negative  u legionella 2/5>>>negative MRSA 2/5 - NEG   ANTIBIOTICS: per ID Bactrim 2/4 >>>  Vanc 2/5 >>>  Imipenem 2/5 >>> 2/7 levaquin 2/5 >>> 2/7 Tamilflu 2/5 >>> 2/7 Cipro 2/7 >>>  SUBJECTIVE:  On nasal cannula. No events overnight. Afebrile   VITAL SIGNS: Temp:  [97.8 F (36.6 C)-98.3 F (36.8 C)] 98.3 F (36.8 C) (02/09 0352) Pulse Rate:  [72-97] 80 (02/09 0352) Resp:  [19-27] 20 (02/09 0352) BP: (95-120)/(47-62) 120/62 mmHg (02/09 0352) SpO2:  [89 %-98 %] 89 % (02/09 0736) Weight:  [93.9 kg (207 lb 0.2 oz)-94.076 kg (207 lb 6.4 oz)] 93.9 kg (207 lb 0.2 oz) (02/09 0352)  INTAKE / OUTPUT: Intake/Output     02/08 0701 - 02/09 0700 02/09 0701 - 02/10 0700   P.O. 440    I.V. (mL/kg) 831 (8.8)    IV Piggyback 1020    Total Intake(mL/kg) 2291 (24.4)    Urine (mL/kg/hr) 2050 (0.9)    Total Output 2050     Net +241           PHYSICAL  EXAMINATION: General: No distress  Neuro: Normal strength  HEENT: No JVP  Cardiovascular: s1s2 regular, no murmur  Lungs: b/l rhonchi, basilar rales  Abdomen: Soft, non-tender, non-distended  Musculoskeletal: no edema  Skin: no rashes   LABS:  Recent Labs Lab 09/28/12 0739 09/28/12 0830 09/29/12 0537 09/29/12 1314 09/30/12 0258 09/30/12 0506 09/30/12 1206 09/30/12 1225 09/30/12 1850  10/02/12 0430 10/03/12 0433 10/04/12 0535  HGB 10.1*  --  10.0*  --   --  9.8* 9.6*  --   --   < > 8.8* 9.0* 8.8*  WBC 7.8  --  9.2  --   --  13.7* 6.4  --   --   < > 17.4* 15.5* 16.8*  PLT 124*  --  159  --   --  183 208  --   --   < > 272 259 249  NA 135  --  136  --   --   --  134*  --   --   --  136 134* 134*  K 3.6  --  4.3  --   --   --  4.1  --   --   --  4.4 4.3 4.2  CL 99  --  104  --   --   --  99  --   --   --  104 100 102  CO2 27  --  27  --   --   --  25  --   --   --  24 24 24   GLUCOSE 122*  --  112*  --   --   --  123*  --   --   --  135* 140* 141*  BUN 13  --  8  --   --   --  8  --   --   --  15 17 18   CREATININE 1.04  --  0.98  --   --   --  0.79  --   --   --  0.83 0.84 0.85  CALCIUM 8.9  --  8.8  --   --   --  8.8  --   --   --  8.5 8.9 8.7  AST 25  --  24  --   --   --  25  --   --   --   --   --   --   ALT 24  --  20  --   --   --  15  --   --   --   --   --   --   ALKPHOS 64  --  62  --   --   --  61  --   --   --   --   --   --   BILITOT 0.7  --  0.5  --   --   --  0.3  --   --   --   --   --   --   PROT 6.3  --  5.8*  --   --   --  6.1  --   --   --   --   --   --   ALBUMIN 3.2*  --  2.7*  --   --   --  2.3*  --   --   --   --   --   --   LATICACIDVEN  --   --   --   --   --   --  1.1  --   --   --   --   --   --   TROPONINI  --  <0.30  --   --   --   --   --   --   --   --   --   --   --   PROCALCITON  --   --   --   --   --   --   --   --  0.51  --   --   --   --   PROBNP  --  8.3  --   --   --  41.5 82.6  --   --   --   --   --   --   PHART  --   --   --  7.427  7.390  --   --  7.450  --   --   --   --   --   PCO2ART  --   --   --  37.3 38.6  --   --  33.4*  --   --   --   --   --  PO2ART  --   --   --  72.4* 64.1*  --   --  316.0*  --   --   --   --   --   < > = values in this interval not displayed.  Recent Labs Lab 10/03/12 1143 10/03/12 1645 10/03/12 1946 10/03/12 2333 10/04/12 0349  GLUCAP 162* 150* 192* 185* 204*   Imaging:   2/7 PORTABLE CHEST - IMPRESSION: Similar to incrementally improved bilateral interstitial and airspace opacities consistent with ARDS, atypical infection, pulmonary alveolar hemorrhage, or pneumonitis.  2/9 PA/Lat CXR - IMPRESSION: No significant change in bilateral air space disease consistent with pneumonia versus less likely edema.   ASSESSMENT / PLAN:   PULMONARY  A: Acute respiratory failure 2nd to pulmonary infiltrates >> ?infection vs drug reaction (bleomycin) vs inflammatory.  Improved 2/07.  P:  Changed to solumedrol 80 mg q12h for now  Need to minimize FiO2 as tolerate with concern for bleomycin toxicity   Defer bronchoscopy as clinical improvement Continue to f/u CXRs  CARDIOVASCULAR  A: Acute RLL PE and Rt leg DVT.  Chronic Diastolic Dysfunction.  P:  Continue Heparin gtt - now more stable & will start Coumadin per Pharm protocol Monitor hemodynamics  Goal even to negative fluid balance   RENAL  A: No issues.  P:  Monitor renal fx, urine outpt  F/u and replace electrolytes as needed   GASTROINTESTINAL  A: Nutrition.  P:  CHO modified diet   HEMATOLOGIC  A: Anemia of chronic disease and critical illness.  Hodgkin's lymphoma.  P:  F/u CBC => Hg= 8.8 to 9.5 recently. Transfuse for Hb < 7   INFECTIOUS  A: B/l diffuse pulmonary infiltrates with fever >> concern for pneumonia in immunosuppressed pt (Lymphoma s/p chemo).  P:  Abx adjusted per ID => on Cipro now  ENDOCRINE  A: Steroid induced hyperglycemia.  P:  SSI   NEUROLOGIC  A: No Acute Issue  P:   Monitor   Continues to improve.  Off BiPAP.  Solumedrol changed to 80 mg q12h.  Abx per ID.  Transferred to telemetry 2/8- 5505.  Keep on PCCM service.   Lonzo Cloud. Kriste Basque, MD [facility] Pulmonary/Critical Care 10/04/2012, 7:40 AM Pager:  [phone] After 3pm call: [phone]

## 2012-10-05 LAB — GLUCOSE, CAPILLARY
Glucose-Capillary: 101 mg/dL — ABNORMAL HIGH (ref 70–99)
Glucose-Capillary: 114 mg/dL — ABNORMAL HIGH (ref 70–99)
Glucose-Capillary: 130 mg/dL — ABNORMAL HIGH (ref 70–99)

## 2012-10-05 LAB — HEPARIN LEVEL (UNFRACTIONATED): Heparin Unfractionated: 0.68 IU/mL (ref 0.30–0.70)

## 2012-10-05 MED ORDER — PREDNISONE 20 MG PO TABS
40.0000 mg | ORAL_TABLET | Freq: Every day | ORAL | Status: DC
  Administered 2012-10-05 – 2012-10-06 (×2): 40 mg via ORAL
  Filled 2012-10-05 (×3): qty 2

## 2012-10-05 MED ORDER — INSULIN ASPART 100 UNIT/ML ~~LOC~~ SOLN: 0.0000 [IU] | Freq: Every day | SUBCUTANEOUS | Status: DC

## 2012-10-05 MED ORDER — ENOXAPARIN SODIUM 150 MG/ML ~~LOC~~ SOLN
1.5000 mg/kg | SUBCUTANEOUS | Status: DC
  Administered 2012-10-05 – 2012-10-06 (×2): 140 mg via SUBCUTANEOUS
  Filled 2012-10-05 (×2): qty 1

## 2012-10-05 MED ORDER — INSULIN ASPART 100 UNIT/ML ~~LOC~~ SOLN: 0.0000 [IU] | Freq: Three times a day (TID) | SUBCUTANEOUS | Status: DC

## 2012-10-05 MED ORDER — INSULIN ASPART 100 UNIT/ML ~~LOC~~ SOLN
0.0000 [IU] | Freq: Three times a day (TID) | SUBCUTANEOUS | Status: DC
  Administered 2012-10-05: 2 [IU] via SUBCUTANEOUS

## 2012-10-05 NOTE — Progress Notes (Signed)
Patient ID: Samuel [name], male   DOB: [birth year], 37 y.o.   MRN: [id number]         [facility] for Infectious Disease    Date of Admission:  09/28/2012           Day 8 vancomycin        Day 8 quinolone therapy        Day 7 trimethoprim sulfamethoxazole Principal Problem:   HCAP (healthcare-associated pneumonia) Active Problems:   Acute respiratory failure with hypoxia   Hodgkin's disease   Thrombocytopenia   Anemia   Pleural effusion on right   Pulmonary emboli   Hyperglycemia   Former cigarette smoker   Weight gain due to medication   . albuterol  2.5 mg Nebulization Q4H WA  . antiseptic oral rinse  15 mL Mouth Rinse q12n4p  . chlorhexidine  15 mL Mouth Rinse BID  . ciprofloxacin  750 mg Oral BID  . enoxaparin (LOVENOX) injection  1.5 mg/kg Subcutaneous Q24H  . insulin aspart  0-9 Units Subcutaneous Q4H  . pantoprazole  40 mg Oral QHS  . predniSONE  40 mg Oral Q breakfast  . sodium chloride  10-40 mL Intracatheter Q12H  . sulfamethoxazole-trimethoprim  2 tablet Oral Q6H    Subjective: He is feeling much better. He has no more cough and his dyspnea on exertion has improved dramatically. He is eager to go home.  Objective: Temp:  [97.5 F (36.4 C)-97.9 F (36.6 C)] 97.5 F (36.4 C) (02/10 0454) Pulse Rate:  [68-80] 68 (02/10 0454) Resp:  [20-21] 20 (02/10 0454) BP: (113-114)/(48-68) 113/62 mmHg (02/10 0454) SpO2:  [91 %-96 %] 95 % (02/10 1216) Weight:  [92.9 kg (204 lb 12.9 oz)] 92.9 kg (204 lb 12.9 oz) (02/10 0454)  General: Alert and comfortable on nasal cannula O2 Skin: No rash Lungs: Clear Cor: Regular S1 and S2 no murmurs  Lab Results Lab Results  Component Value Date   WBC 16.8* 10/04/2012   HGB 8.8* 10/04/2012   HCT 25.8* 10/04/2012   MCV 81.9 10/04/2012   PLT 249 10/04/2012    Studies/Results: Dg Chest 2 View  10/04/2012  *RADIOLOGY REPORT*  Clinical Data: Short of breath  CHEST - 2 VIEW  Comparison: Chest radiograph to 02/2013  Findings: Left-sided  power port is unchanged.  Stable enlarged heart silhouette.  There is bilateral diffuse air space disease not improved compared to prior.  No pleural fluid.  No pneumothorax.  IMPRESSION: No significant change in bilateral air space disease consistent with pneumonia versus less likely edema.   Original Report Authenticated By: Genevive Bi, M.D.     Assessment: He is improving on antibiotics for possible healthcare associated pneumonia and steroids for possible pneumonitis related to bleomycin therapy. I will stop vancomycin and ciprofloxacin and plan on continuing oral trimethoprim-sulfamethoxazole for 3 weeks total to the outside possibility that he had pneumocystis pneumonia.  Plan: 1. Continue trimethoprim-sulfamethoxazole 2 double strength tablets 3 times a day for 2 more weeks 2. Discontinue vancomycin and ciprofloxacin 3. Prednisone taper  [name], MD [facility] for Infectious Disease [facility] [phone] pager   [phone] cell 10/05/2012, 1:04 PM

## 2012-10-05 NOTE — Progress Notes (Signed)
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 2/03 with fever (Tmax 102F), chills, weakness, dyspnea, and hypoxia 2nd to b/l diffuse pulmonary infiltrates and RLL PE. Transferred to [facility] 2/05 for further therapy.  Significant PMHx of Diastolic CHF, b/l hearing loss    SIGNIFICANT EVENTS:  2/5 - Transferred to [facility] MICU  2/07 Transfer to SDU  2/08 Tx to 5505 telemetry  TESTS:  09/07/12 PFT >> FEV1 3.64 (87%), FEV1% 84, TLC 6.47 (96%), DLCO 77%  09/29/12 CT chest >> RLL PE, diffuse b/l GGO and alveolar ASD, decreased LAN  09/30/12 Doppler legs >> acute Rt popliteal vein DVT  09/30/12 Echo >> EF 55 to 60%, PAS 35 mmHg   LINES / TUBES:  Power Port - Left Chest Wall  Peripheral IV  CULTURES:  Blood CX 2/3 >>> no growth  resp viral panel 2/5 >>> negative CMV 2/5>>> neg EPV 2/5>>> positive  RSV 2/5>>>negative  Ustrep 2/5>>>negative  u legionella 2/5>>>negative MRSA 2/5 - NEG   ANTIBIOTICS: per ID Bactrim 2/4 >>>  Vanc 2/5 >>> 2/10 Imipenem 2/5 >>> 2/7 levaquin 2/5 >>> 2/7 Tamilflu 2/5 >>> 2/7 Cipro 2/7 >>>  SUBJECTIVE:  On nasal cannula. No events overnight. Afebrile   VITAL SIGNS: Temp:  [97.5 F (36.4 C)-97.9 F (36.6 C)] 97.5 F (36.4 C) (02/10 0454) Pulse Rate:  [68-80] 68 (02/10 0454) Resp:  [20-21] 20 (02/10 0454) BP: (113-114)/(48-68) 113/62 mmHg (02/10 0454) SpO2:  [91 %-96 %] 94 % (02/10 0927) Weight:  [204 lb 12.9 oz (92.9 kg)] 204 lb 12.9 oz (92.9 kg) (02/10 0454)  INTAKE / OUTPUT: Intake/Output     02/09 0701 - 02/10 0700 02/10 0701 - 02/11 0700   P.O. 1080 240   I.V. (mL/kg) 498.9 (5.4)    IV Piggyback 500    Total Intake(mL/kg) 2078.9 (22.4) 240 (2.6)   Urine (mL/kg/hr) 1900 (0.9)    Total Output 1900     Net +178.9 +240         PHYSICAL  EXAMINATION: General: No distress  Neuro: Normal strength  HEENT: No JVP  Cardiovascular: s1s2 regular, no murmur  Lungs: b/l rhonchi, basilar rales  Abdomen: Soft, non-tender, non-distended  Musculoskeletal: no edema  Skin: no rashes   LABS:  Recent Labs Lab 09/29/12 0537 09/29/12 1314 09/30/12 0258 09/30/12 0506 09/30/12 1206 09/30/12 1225 09/30/12 1850  10/02/12 0430 10/03/12 0433 10/04/12 0535  HGB 10.0*  --   --  9.8* 9.6*  --   --   < > 8.8* 9.0* 8.8*  WBC 9.2  --   --  13.7* 6.4  --   --   < > 17.4* 15.5* 16.8*  PLT 159  --   --  183 208  --   --   < > 272 259 249  NA 136  --   --   --  134*  --   --   --  136 134* 134*  K 4.3  --   --   --  4.1  --   --   --  4.4 4.3 4.2  CL 104  --   --   --  99  --   --   --  104 100 102  CO2 27  --   --   --  25  --   --   --  24 24 24   GLUCOSE 112*  --   --   --  123*  --   --   --  135* 140* 141*  BUN 8  --   --   --  8  --   --   --  15 17 18   CREATININE 0.98  --   --   --  0.79  --   --   --  0.83 0.84 0.85  CALCIUM 8.8  --   --   --  8.8  --   --   --  8.5 8.9 8.7  AST 24  --   --   --  25  --   --   --   --   --   --   ALT 20  --   --   --  15  --   --   --   --   --   --   ALKPHOS 62  --   --   --  61  --   --   --   --   --   --   BILITOT 0.5  --   --   --  0.3  --   --   --   --   --   --   PROT 5.8*  --   --   --  6.1  --   --   --   --   --   --   ALBUMIN 2.7*  --   --   --  2.3*  --   --   --   --   --   --   LATICACIDVEN  --   --   --   --  1.1  --   --   --   --   --   --   PROCALCITON  --   --   --   --   --   --  0.51  --   --   --   --   PROBNP  --   --   --  41.5 82.6  --   --   --   --   --   --   PHART  --  7.427 7.390  --   --  7.450  --   --   --   --   --   PCO2ART  --  37.3 38.6  --   --  33.4*  --   --   --   --   --   PO2ART  --  72.4* 64.1*  --   --  316.0*  --   --   --   --   --   < > = values in this interval not displayed.  Recent Labs Lab 10/04/12 1958 10/04/12 2115 10/05/12 0018  10/05/12 0450 10/05/12 0753  GLUCAP 180* 209* 191* 140* 101*   Imaging:   2/7 PORTABLE CHEST - IMPRESSION: Similar to incrementally improved bilateral interstitial and airspace opacities consistent with ARDS, atypical infection, pulmonary alveolar hemorrhage, or pneumonitis.  2/9 PA/Lat CXR - IMPRESSION: No significant change in bilateral air space disease consistent with pneumonia versus less likely edema.   ASSESSMENT / PLAN:   PULMONARY  A: Acute respiratory failure 2nd to pulmonary infiltrates >> ?infection vs drug reaction (bleomycin) vs inflammatory.  Improved 2/10.  P:  Change to po prednisone  Need to minimize FiO2 as tolerate with concern for bleomycin toxicity   Continue to f/u CXRs  CARDIOVASCULAR  A: Acute RLL PE and Rt leg DVT.  Chronic Diastolic Dysfunction.  P:  Start lovenox and d/c coumadin and heparin  See Pharm Consult recs re anticoag in lymphoma and Septra use Monitor hemodynamics  Goal even to negative fluid balance   RENAL  A: No issues.  P:  Monitor renal fx, urine outpt  F/u and replace electrolytes as needed   GASTROINTESTINAL  A: Nutrition.  P:  CHO modified diet   HEMATOLOGIC  A: Anemia of chronic disease and critical illness.  Hodgkin's lymphoma.  P:  F/u CBC => Hg= 8.8 to 9.5 recently. Transfuse for Hb < 7   INFECTIOUS  A: B/l diffuse pulmonary infiltrates with fever >> concern for pneumonia in immunosuppressed pt (Lymphoma s/p chemo).  P:  Abx adjusted per ID => on Cipro now  ENDOCRINE  A: Steroid induced hyperglycemia.  P:  SSI   NEUROLOGIC  A: No Acute Issue  P:  Monitor   Start D/C planning. Home 1-2 days   Dorcas Carrow [facility] Pulmonary/Critical Care 10/05/2012, 10:31 AM Beeper  [phone]  Cell  [phone]  If no response or cell goes to voicemail, call beeper [phone]

## 2012-10-05 NOTE — Progress Notes (Signed)
SATURATION QUALIFICATIONS: (This note is used to comply with regulatory documentation for home oxygen)  Patient Saturations on Room Air at Rest = 88%  Patient Saturations on Room Air while Ambulating = 84%  Patient Saturations on 3 Liters of oxygen while Ambulating = 90%  

## 2012-10-05 NOTE — Progress Notes (Signed)
Inpatient Diabetes Program Recommendations  AACE/ADA: New Consensus Statement on Inpatient Glycemic Control (2013)  Target Ranges:  Prepandial:   less than 140 mg/dL      Peak postprandial:   less than 180 mg/dL (1-2 hours)      Critically ill patients:  140 - 180 mg/dL   Results for CONAL, SHETLEY (MRN [id number]) as of 10/05/2012 13:11  Ref. Range 10/04/2012 03:49 10/04/2012 07:41 10/04/2012 11:32 10/04/2012 16:05 10/04/2012 19:58 10/04/2012 21:15 10/05/2012 00:18 10/05/2012 04:50 10/05/2012 07:53 10/05/2012 12:13  Glucose-Capillary Latest Range: 70-99 mg/dL 784 (H) 696 (H) 295 (H) 155 (H) 180 (H) 209 (H) 191 (H) 140 (H) 101 (H) 114 (H)    Inpatient Diabetes Program Recommendations Correction (SSI): Please consider changing the frequency of CBGs and Novolog correction to ACHS if patient is eating and tolerating diet.  Note: Currently patient is ordered to receive Novolog sensitive correction Q4H for inpatient glycemic control.  Please consider changing frequency of CBGs and Novolog correction to ACHS if patient is eating and tolerating diet.  Will continue to follow.  Thanks, Orlando Penner, RN, BSN, CCRN Diabetes Coordinator Inpatient Diabetes Program [phone]

## 2012-10-05 NOTE — Progress Notes (Signed)
ANTICOAGULATION CONSULT NOTE - Follow Up Consult  Pharmacy Consult for Lovenox Indication: RLL PE, R DVT  Allergies  Allergen Reactions  . Amoxicillin Rash    Patient Measurements: Height: 5\' 9"  (175.3 cm) Weight: 204 lb 12.9 oz (92.9 kg) IBW/kg (Calculated) : 70.7  Vital Signs: Temp: 97.5 F (36.4 C) (02/10 0454) Temp src: Oral (02/10 0454) BP: 113/62 mmHg (02/10 0454) Pulse Rate: 68 (02/10 0454)  Labs:  Recent Labs  10/03/12 0433 10/04/12 0535 10/05/12 0500  HGB 9.0* 8.8*  --   HCT 26.0* 25.8*  --   PLT 259 249  --   HEPARINUNFRC 0.63 0.49 0.68  CREATININE 0.84 0.85  --     Estimated Creatinine Clearance: 135.3 ml/min (by C-G formula based on Cr of 0.85).  Assessment: 37 y/o male with Hodgkin's Lymphoma on chemotherapy with RLL PE and R DVT. Pharmacy consulted to change heparin to Lovenox in anticipation of discharge. Spoke with Verdon Cummins, RN and heparin was turned off ~9:45. Renal function is stable. No bleeding noted, H/H are low, platelets are wnl.  Goal of Therapy:  Anti Xa level 1-2 units/ml 4 hours after Lovenox given Monitor platelets by anticoagulation protocol: Yes   Plan:  -Lovenox 140 mg SQ q24h - first dose at 11:00 -CBC q72h while on Lovenox -Monitor for signs/symptoms of bleeding  [facility], [address].D., BCPS Clinical Pharmacist Pager: [phone] 10/05/2012 9:54 AM

## 2012-10-06 LAB — CBC
MCH: 28.8 pg (ref 26.0–34.0)
Platelets: 299 10*3/uL (ref 150–400)
RBC: 3.47 MIL/uL — ABNORMAL LOW (ref 4.22–5.81)
WBC: 16.9 10*3/uL — ABNORMAL HIGH (ref 4.0–10.5)

## 2012-10-06 LAB — BASIC METABOLIC PANEL
CO2: 25 mEq/L (ref 19–32)
Calcium: 8.8 mg/dL (ref 8.4–10.5)
Chloride: 99 mEq/L (ref 96–112)
Sodium: 133 mEq/L — ABNORMAL LOW (ref 135–145)

## 2012-10-06 MED ORDER — PREDNISONE 20 MG PO TABS: ORAL_TABLET | ORAL | Status: DC

## 2012-10-06 MED ORDER — SULFAMETHOXAZOLE-TMP DS 800-160 MG PO TABS: 2.0000 | ORAL_TABLET | Freq: Three times a day (TID) | ORAL | Status: DC

## 2012-10-06 MED ORDER — HYDROCODONE-ACETAMINOPHEN 5-325 MG PO TABS: 1.0000 | ORAL_TABLET | ORAL | Status: DC | PRN

## 2012-10-06 MED ORDER — HEPARIN SOD (PORK) LOCK FLUSH 100 UNIT/ML IV SOLN
500.0000 [IU] | INTRAVENOUS | Status: AC | PRN
  Administered 2012-10-06: 500 [IU]

## 2012-10-06 MED ORDER — ENOXAPARIN SODIUM 150 MG/ML ~~LOC~~ SOLN: 1.5000 mg/kg | Freq: Every day | SUBCUTANEOUS | Status: DC

## 2012-10-06 NOTE — Care Management Note (Addendum)
    Page 1 of 2   10/06/2012     3:21:59 PM   CARE MANAGEMENT NOTE 10/06/2012  Patient:  Samuel [name],Samuel [name]   Account Number:  [id number]  Date Initiated:  10/01/2012  Documentation initiated by:  [facility]  Subjective/Objective Assessment:   Tx from [facility] with increased resp distress.  On bipap.     Action/Plan:   Anticipated DC Date:  10/06/2012   Anticipated DC Plan:  HOME/SELF CARE      DC Planning Services  CM consult  MATCH Program      [facility] Choice  DURABLE MEDICAL EQUIPMENT   Choice offered to / List presented to:  C-1 Patient   DME arranged  OXYGEN      DME agency  [facility]        Status of service:  Completed, signed off Medicare Important Message given?   (If response is "NO", the following Medicare IM given date fields will be blank) Date Medicare IM given:   Date Additional Medicare IM given:    Discharge Disposition:  HOME/SELF CARE  Per UR Regulation:  Reviewed for med. necessity/level of care/duration of stay  If discussed at Long Length of Stay Meetings, dates discussed:    Comments:  Contact:  Hosmer,Stacey Spouse [phone]  10/06/12 10:49 Letha Cape RN, BSN [phone] patient lives with wife, pta independent. Patient will need lovenox at dc for 30 day supply 140mg  q24, he gets his medications from [facility] in [place]  [phone] and fax is [phone].  RN is giving patient education on lovenox.   I called the pharmacy to see if they have the generic lovenox they have 30 -150mg   but not sure how much it will cost because they have him under another insurance.  Benefits check is being done to see how much co pay would be for patient.  Patient will also need home oxygen, he wanted to go with [facility] but they are not in network with [employer], so patient states he will go with [facility], referral made to [facility] with [facility].  Patient is for dc today. Patient states his wife will be picking him up after lunch to go home.

## 2012-10-06 NOTE — Progress Notes (Signed)
0830 Lovenox education started today currnently watching video Partient Guide to Lovenox.

## 2012-10-06 NOTE — Discharge Summary (Signed)
Physician Discharge Summary     Patient ID: Samuel [name] MRN: [id number] DOB/AGE: [birth year] 37 y.o.  Admit date: 09/28/2012 Discharge date: 10/06/2012  Admission Diagnoses: Acute respiratory failure  Discharge Diagnoses:  Principal Problem:   HCAP (healthcare-associated pneumonia) Active Problems:   Hodgkin's disease   Thrombocytopenia   Anemia   Pleural effusion on right   Pulmonary emboli   Acute respiratory failure with hypoxia   Hyperglycemia   Former cigarette smoker   Weight gain due to medication   Significant Hospital tests/ studies/ interventions and procedures    SIGNIFICANT EVENTS:  2/5 - Transferred to [facility] MICU  2/07 Transfer to SDU  2/08 Tx to 5505 telemetry   TESTS:  09/07/12 PFT >> FEV1 3.64 (87%), FEV1% 84, TLC 6.47 (96%), DLCO 77%  09/29/12 CT chest >> RLL PE, diffuse b/l GGO and alveolar ASD, decreased LAN  09/30/12 Doppler legs >> acute Rt popliteal vein DVT  09/30/12 Echo >> EF 55 to 60%, PAS 35 mmHg   LINES / TUBES:  Power Port - Left Chest Wall  Peripheral IV   CULTURES:  Blood CX 2/3 >>> no growth  resp viral panel 2/5 >>> negative  CMV 2/5>>> neg  EPV 2/5>>> positive  RSV 2/5>>>negative  Ustrep 2/5>>>negative  u legionella 2/5>>>negative MRSA 2/5 - NEG   ANTIBIOTICS: per ID  Bactrim 2/4 >>>  Vanc 2/5 >>> 2/10  Imipenem 2/5 >>> 2/7  levaquin 2/5 >>> 2/7  Tamilflu 2/5 >>> 2/7  Cipro 2/7 >>> 2/10 Bactrim 2/9>>>   BRIEF PATIENT DESCRIPTION: 37 yo male former smoker with 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 Transferred to [facility] 2/05 for further therapy.  Significant PMHx of Diastolic CHF, b/l hearing loss  Hospital Course:  Acute respiratory failure 2nd to pulmonary infiltrates >> ?infection vs drug reaction (bleomycin) vs inflammatory.  Pulmonary emboli and RLE DVT.    [facility]. At time of transfer had worsening diffuse pulmonary infiltrates and his CT chest demonstrated incidental PE. Therapeutic interventions included: systemic anticoagulation, NIPPV, empiric antibiotics, infectious disease consult and systemic steroids given concern for bleomycin toxicity. As noted we empirically treated him for possible HCAP, no orgs were identified. She has completed empiric antibiotics with the exception of bactrim which was started for possibility of pneumocystis. At the time of d/c he is still oxygen dependent, but improving and ambulatory w/ oxygen at 3 liters for rest and 4 with activity.  plan Send out of slow pred taper down by 10mg  every 7days to 20mg /d and stay Start 40mg   Two weeks of bactrim current dose  lovenox current dose  Oxygen 3L rest 3-4L exertion F/u w/ Neijstrom   Chronic diastolic dysfunction Treated medically and supportively.  Plan  See d/c med list  Anemia of chronic disease and critical illness.  Hodgkin's lymphoma.  F/u CBC => Hg= 8.8 to 9.5 recently.  Plan Home on LMWH given DVT/PE and lymphoma F/u w/ heme/onc    Discharge Exam: BP 91/55  Pulse 77  Temp(Src) 97.8 F (36.6 C) (Oral)  Resp 20  Ht 5\' 9"  (1.753 m)  Wt 90.5 kg (199 lb 8.3 oz)  BMI 29.45 kg/m2  SpO2 94% 3 liters   PHYSICAL EXAMINATION:  General: No distress  Neuro: Normal strength  HEENT: No JVP  Cardiovascular: s1s2 regular, no murmur  Lungs:  b/l rhonchi, basilar rales  Abdomen: Soft, non-tender, non-distended  Musculoskeletal: no edema  Skin: no rashes  Labs at discharge Lab Results  Component Value Date   CREATININE 1.01 10/06/2012   BUN 23 10/06/2012   NA 133* 10/06/2012   K 4.4 10/06/2012   CL 99 10/06/2012   CO2 25 10/06/2012   Lab Results  Component Value Date   WBC 16.9* 10/06/2012   HGB 10.0* 10/06/2012   HCT 29.0* 10/06/2012   MCV 83.6 10/06/2012   PLT 299 10/06/2012   Lab Results  Component Value  Date   ALT 15 09/30/2012   AST 25 09/30/2012   ALKPHOS 61 09/30/2012   BILITOT 0.3 09/30/2012   No results found for this basename: INR,  PROTIME    Current radiology studies No results found.  Disposition:  01-Home or Self Care      Discharge Orders   Future Appointments Provider Department Dept Phone   10/09/2012 9:00 AM Randall An, MD [facility] [phone]   10/12/2012 8:45 AM Ap-Acapa Team A [facility]ER C[facility] [phone]   10/13/2012 11:30 AM Ellouise Newer, PA [facility] [phone]   10/13/2012 12:00 PM Ap-Acapa Chair 7 [facility] [phone]   10/15/2012 2:15 PM Julio Sicks, NP [facility] [phone]   10/21/2012 12:30 PM Randall An, MD [facility] CANCER CENTER [phone]   10/27/2012 12:00 PM Ap-Acapa Chair 7 [facility] CAN[facility]ER CENTER [phone]   11/03/2012 9:00 AM Ap-Respp Pulmonary Lab [facility] [phone]   11/04/2012 1:00 PM Ap-Cardiopul Echo Lab [facility]PUL[facility] [phone]   11/09/2012 8:45 AM Ap-Acapa Team A [place] [facility] [phone]   11/10/2012 12:00 PM Ap-Acapa Chair 7 [facility]ER CENTER [phone]   11/20/2012 4:30 PM Storm Frisk, MD  Pulmonary Care [phone]   11/23/2012 8:45 AM Ap-Acapa Team A [facility]ER [facility]ENTER [phone]   11/24/2012 12:00 PM Ap-Acapa Chair 7 [facility] CANCER CENTER [phone]   11/30/2012 7:30 AM Wl-Nm Pet 1 [facility] [phone]   Pt should arrive15 minutes prior to scheduled appt time. Please inform patient that exam will take a minimum of 1 1/2 hours. Patient to be NPO 6 hours prior to exam  and should not take any insulin the day of exam.   Future Orders Complete By Expires     Diet - low sodium heart healthy  As directed     Increase activity slowly  As directed         Medication List    STOP taking these medications       ADRIAMYCIN IV      DACARBAZINE IV     dexamethasone 4 MG tablet  Commonly known as:  DECADRON     lidocaine-prilocaine cream  Commonly known as:  EMLA     potassium chloride SA 20 MEQ tablet  Commonly known as:  K-DUR,KLOR-CON     sodium chloride 0.9 % SOLN 50 mL with bleomycin 30 UNITS SOLR     sodium chloride 0.9 % SOLN 50 mL with vinBLAStine 1 MG/ML SOLN      TAKE these medications       enoxaparin 150 MG/ML injection  Commonly known as:  LOVENOX  Inject 0.93 mLs (140 mg total) into the skin daily.     HYDROcodone-acetaminophen 5-325 MG per tablet  Commonly known as:  NORCO/VICODIN  Take 1-2 tablets by mouth every 4 (four) hours  as needed.     multivitamin tablet  Take 1 tablet by mouth daily.     ondansetron 8 MG tablet  Commonly known as:  ZOFRAN  Take 8 mg by mouth. Starting the day after chemo, take 1 tablet in the am and 1 tablet in the pm for 3 days. Then may take 1 tablet two times a day IF needed for nausea/vomiting. Side Effect: constipation     predniSONE 20 MG tablet  Commonly known as:  DELTASONE  Take 2 tabs daily for 7 days, then 1.5 tab for 7 days, then 1 tab daily, unless told otherwise.     simethicone 80 MG chewable tablet  Commonly known as:  MYLICON  Chew 80 mg by mouth every 6 (six) hours as needed. For gas     sulfamethoxazole-trimethoprim 800-160 MG per tablet  Commonly known as:  BACTRIM DS  Take 2 tablets by mouth 3 (three) times daily.     ZANTAC PO  Take 1 capsule by mouth as needed.       Follow-up Information   Follow up with Randall An, MD On 10/21/2012. (at 1230)    Contact information:   [address] [place] [address] [phone]       Follow up with PARRETT,TAMMY, NP On 10/15/2012. (at 215pm)    Contact information:   [address] [place] [address] [phone]       Follow up with Shan Levans, MD On 11/20/2012. (at 430pm)    Contact information:   [address] [place] [address] [phone]        Discharged Condition: good  Physician Statement:   The Patient was personally examined, the discharge assessment and plan has been personally reviewed and I agree with ACNP Babcock's assessment and plan. > 30 minutes of time have been dedicated to discharge assessment, planning and discharge instructions.   Signed: BABCOCK,PETE 10/06/2012, 12:50 PM   I have seen and examined this patient with P Babcock.  I agree with this plan of care and d/c instruction. The pt is in improved condition.     Dorcas Carrow Beeper  [phone]  Cell  [phone]  If no response or cell goes to voicemail, call beeper [phone]

## 2012-10-06 NOTE — Progress Notes (Signed)
1000 Patient reviewed Lovenox kit allowed time to ask and answer questions verbalize and understands. 1100 Patient did self administer his Lovenox injection today.

## 2012-10-06 NOTE — Progress Notes (Signed)
PCCM  Discharge today to home:  Send out of slow pred taper down by 10mg  every 7days to 20mg /d and stay  Start 40mg  Two weeks of bactrim current dose lovenox current dose Oxygen 3L rest 3-4L exertion   See tammy parrett one week F/u eric neijstrom heme onc in [place] soon

## 2012-10-06 NOTE — Progress Notes (Signed)
1330 Discharge instructions reviewed with patient and mother. Prescriptions given to patient by nurse practioner . Patient verbalize and understands  All instructions also understands how to give lovenox injections.

## 2012-10-08 ENCOUNTER — Ambulatory Visit (HOSPITAL_COMMUNITY): Payer: PRIVATE HEALTH INSURANCE | Admitting: Oncology

## 2012-10-09 ENCOUNTER — Encounter (HOSPITAL_COMMUNITY): Payer: PRIVATE HEALTH INSURANCE | Attending: Oncology | Admitting: Oncology

## 2012-10-09 ENCOUNTER — Ambulatory Visit (HOSPITAL_COMMUNITY): Payer: PRIVATE HEALTH INSURANCE | Admitting: Oncology

## 2012-10-09 VITALS — BP 129/81 | HR 115 | Temp 98.1°F | Resp 20 | Wt 196.1 lb

## 2012-10-09 DIAGNOSIS — J984 Other disorders of lung: Secondary | ICD-10-CM

## 2012-10-09 DIAGNOSIS — J189 Pneumonia, unspecified organism: Secondary | ICD-10-CM | POA: Insufficient documentation

## 2012-10-09 DIAGNOSIS — C8119 Nodular sclerosis classical Hodgkin lymphoma, extranodal and solid organ sites: Secondary | ICD-10-CM

## 2012-10-09 DIAGNOSIS — C8190 Hodgkin lymphoma, unspecified, unspecified site: Secondary | ICD-10-CM | POA: Insufficient documentation

## 2012-10-09 NOTE — Patient Instructions (Signed)
[facility] Discharge Instructions  RECOMMENDATIONS MADE BY THE CONSULTANT AND ANY TEST RESULTS WILL BE SENT TO YOUR REFERRING PHYSICIAN.  EXAM FINDINGS BY THE PHYSICIAN TODAY AND SIGNS OR SYMPTOMS TO REPORT TO CLINIC OR PRIMARY PHYSICIAN: Exam findings as discussed with Dr. Mariel Sleet.      SPECIAL INSTRUCTIONS/FOLLOW-UP: 1.  Call our office once your Prednisone is down to 1 tablet daily - we may place you on a taper dose of prednisone to prevent steroid withdrawals. 2.  We have canceled your pulmonary function test to be done here at [facility].  Keep your appointment with Dr. Lynelle Doctor office to have it done there instead. 3.  Please keep your appointment to see Dr. Mariel Sleet next Friday to re-evaluate your progress and schedule a time to start the new chemotherapy.  Samuel [name] will also be in touch to do the chemo teaching. 4.  You will have labs done the same day as when you see Dr. Mariel Sleet next Friday.  Thank you for choosing [facility] to provide your oncology and hematology care.  To afford each patient quality time with our providers, please arrive at least 15 minutes before your scheduled appointment time.  With your help, our goal is to use those 15 minutes to complete the necessary work-up to ensure our physicians have the information they need to help with your evaluation and healthcare recommendations.    Effective January 1st, 2014, we ask that you re-schedule your appointment with our physicians should you arrive 10 or more minutes late for your appointment.  We strive to give you quality time with our providers, and arriving late affects you and other patients whose appointments are after yours.    Again, thank you for choosing [facility].  Our hope is that these requests will decrease the amount of time that you wait before being seen by our physicians.       _____________________________________________________________  Should you have  questions after your visit to [facility] please contact our office at [phone] between the hours of 8:30 a.m. and 5:00 p.m.  Voicemails left after 4:30 p.m. will not be returned until the following business day.  For prescription refill requests, have your pharmacy contact our office with your prescription refill request.

## 2012-10-09 NOTE — Progress Notes (Signed)
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 He had a one-week delay with cycle 1 due to low blood counts, finally agreed to [facility] and has been on schedule since until recently. 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 He was discharged from the hospital on 10/06/2012. He is on a tapering schedule of prednisone and when he gets to 1 pill per day, 20 mg each, we will then switch him after 7 days to a half a tablet every other day for Friday 10-14 days. He states he is breathing better but O2 sats are only around 91% today. He has no fever, night sweats, chills. He is not bringing up any phlegm. On exam he looks chronically ill. Once again he is afebrile. Respirations are approximately 22/ somewhat shallow. Skin is warm and dry to the touch. Lungs show diminished breath sounds but they are clear. He has no rales, rhonchi, wheezes, or rubs. Heart shows a tachycardia 120 without S3 gallop or murmur. Port is intact. He has no hepatosplenomegaly. He has no leg edema. Skin is unremarkable.  He needs to continue his Septra DS until completion, finish the steroids as mentioned above, and I will see him next week after only function studies on Thursday. We will not treat him until February 24 at the earliest. We will not challenged him again we'll bleomycin. I will change his medication to VP-16 and see how he does with that regimen.

## 2012-10-12 ENCOUNTER — Inpatient Hospital Stay (HOSPITAL_COMMUNITY): Payer: PRIVATE HEALTH INSURANCE

## 2012-10-13 ENCOUNTER — Ambulatory Visit (HOSPITAL_COMMUNITY): Payer: PRIVATE HEALTH INSURANCE | Admitting: Oncology

## 2012-10-13 ENCOUNTER — Ambulatory Visit (HOSPITAL_COMMUNITY): Payer: PRIVATE HEALTH INSURANCE

## 2012-10-15 ENCOUNTER — Inpatient Hospital Stay: Payer: PRIVATE HEALTH INSURANCE | Admitting: Adult Health

## 2012-10-16 ENCOUNTER — Ambulatory Visit (HOSPITAL_COMMUNITY)
Admission: RE | Admit: 2012-10-16 | Discharge: 2012-10-16 | Disposition: A | Discharge status: Home / Self Care | Payer: PRIVATE HEALTH INSURANCE | Source: Ambulatory Visit | Attending: Oncology | Admitting: Oncology

## 2012-10-16 ENCOUNTER — Encounter (HOSPITAL_BASED_OUTPATIENT_CLINIC_OR_DEPARTMENT_OTHER): Payer: PRIVATE HEALTH INSURANCE

## 2012-10-16 ENCOUNTER — Encounter (HOSPITAL_BASED_OUTPATIENT_CLINIC_OR_DEPARTMENT_OTHER): Payer: PRIVATE HEALTH INSURANCE | Admitting: Oncology

## 2012-10-16 ENCOUNTER — Encounter (HOSPITAL_COMMUNITY): Payer: PRIVATE HEALTH INSURANCE

## 2012-10-16 ENCOUNTER — Inpatient Hospital Stay (HOSPITAL_COMMUNITY): Payer: PRIVATE HEALTH INSURANCE

## 2012-10-16 VITALS — BP 129/79 | HR 121 | Temp 98.1°F | Resp 18 | Wt 200.0 lb

## 2012-10-16 DIAGNOSIS — C8190 Hodgkin lymphoma, unspecified, unspecified site: Secondary | ICD-10-CM

## 2012-10-16 DIAGNOSIS — Z8571 Personal history of Hodgkin lymphoma: Secondary | ICD-10-CM | POA: Insufficient documentation

## 2012-10-16 DIAGNOSIS — J9589 Other postprocedural complications and disorders of respiratory system, not elsewhere classified: Secondary | ICD-10-CM

## 2012-10-16 DIAGNOSIS — J189 Pneumonia, unspecified organism: Secondary | ICD-10-CM

## 2012-10-16 DIAGNOSIS — C8119 Nodular sclerosis classical Hodgkin lymphoma, extranodal and solid organ sites: Secondary | ICD-10-CM

## 2012-10-16 LAB — COMPREHENSIVE METABOLIC PANEL
Alkaline Phosphatase: 59 U/L (ref 39–117)
BUN: 22 mg/dL (ref 6–23)
CO2: 24 mEq/L (ref 19–32)
Chloride: 97 mEq/L (ref 96–112)
Creatinine, Ser: 1.17 mg/dL (ref 0.50–1.35)
GFR calc non Af Amer: 79 mL/min — ABNORMAL LOW (ref >90)
Glucose, Bld: 127 mg/dL — ABNORMAL HIGH (ref 70–99)
Potassium: 4.5 mEq/L (ref 3.5–5.1)
Total Bilirubin: 0.4 mg/dL (ref 0.3–1.2)

## 2012-10-16 LAB — CBC WITH DIFFERENTIAL/PLATELET
Basophils Absolute: 0.1 10*3/uL (ref 0.0–0.1)
Basophils Relative: 0 % (ref 0–1)
Eosinophils Relative: 0 % (ref 0–5)
Lymphocytes Relative: 5 % — ABNORMAL LOW (ref 12–46)
MCHC: 33.1 g/dL (ref 30.0–36.0)
Neutro Abs: 22.8 10*3/uL — ABNORMAL HIGH (ref 1.7–7.7)
Platelets: 237 10*3/uL (ref 150–400)
RDW: 20.1 % — ABNORMAL HIGH (ref 11.5–15.5)
WBC: 25.1 10*3/uL — ABNORMAL HIGH (ref 4.0–10.5)

## 2012-10-16 LAB — LACTATE DEHYDROGENASE: LDH: 235 U/L (ref 94–250)

## 2012-10-16 NOTE — Progress Notes (Signed)
Patient notified of chemo appt 10/20/2012

## 2012-10-16 NOTE — Patient Instructions (Addendum)
.  [facility] Discharge Instructions  RECOMMENDATIONS MADE BY THE CONSULTANT AND ANY TEST RESULTS WILL BE SENT TO YOUR REFERRING PHYSICIAN.  EXAM FINDINGS BY THE PHYSICIAN TODAY AND SIGNS OR SYMPTOMS TO REPORT TO CLINIC OR PRIMARY PHYSICIAN:  If chest xray ok we will start new chemo Monday MEDICATIONS PRESCRIBED:  Continue lovenox til Monday..  INSTRUCTIONS GIVEN AND DISCUSSED: We will check on getting you a machine to check your INR levels at home once you start on coumadin  SPECIAL INSTRUCTIONS/FOLLOW-UP: 2 weeks to see Dr. Mariel Sleet  Thank you for choosing [facility] to provide your oncology and hematology care.  To afford each patient quality time with our providers, please arrive at least 15 minutes before your scheduled appointment time.  With your help, our goal is to use those 15 minutes to complete the necessary work-up to ensure our physicians have the information they need to help with your evaluation and healthcare recommendations.    Effective January 1st, 2014, we ask that you re-schedule your appointment with our physicians should you arrive 10 or more minutes late for your appointment.  We strive to give you quality time with our providers, and arriving late affects you and other patients whose appointments are after yours.    Again, thank you for choosing [facility].  Our hope is that these requests will decrease the amount of time that you wait before being seen by our physicians.       _____________________________________________________________  Should you have questions after your visit to [facility], please contact our office at [phone] between the hours of 8:30 a.m. and 5:00 p.m.  Voicemails left after 4:30 p.m. will not be returned until the following business day.  For prescription refill requests, have your pharmacy contact our office with your prescription refill request.

## 2012-10-16 NOTE — Progress Notes (Signed)
Labs drawn today for cbc/diff,cmp,ldh 

## 2012-10-16 NOTE — Progress Notes (Signed)
#  1 stage II A. 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 He had a one-week delay with cycle 1 due to low blood counts, finally agreed to Neulasta. 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 He did have fever initially. He is on a tapering schedule a prednisone and feeling much improved compared to last week. Chest x-ray today is also much improved pluses lungs are clear to auscultation. He has no fever today. He is in no acute distress. We will continue to finish his steroids and all antibiotics.  His pulmonary function studies were diisplaced from yesterday to next Wednesday but I think as long as he is  chest x-ray looks markedly improved and he still feels good on Monday we'll proceed with therapy without bleomycin. We're substituting etoposide for the bleomycin

## 2012-10-16 NOTE — Patient Instructions (Signed)
[facility]   CHEMOTHERAPY INSTRUCTIONS  VP16 (Etoposide) - this medication can lower your blood pressure. We will monitor this during chemo. Bone marrow suppression (lowers white blood cells (fight infection), lowers red blood cells (make up your blood), lowers platelets (help blood to clot). Hair loss, loss of appetite.  POTENTIAL SIDE EFFECTS OF TREATMENT: Increased Susceptibility to Infection, Vomiting, Constipation, Hair Thinning, Changes in Character of Skin and Nails (brittleness, dryness,etc.), Bone Marrow Suppression, Complete Hair Loss, Nausea and Diarrhea  SELF CARE ACTIVITIES WHILE ON CHEMOTHERAPY: Increase your fluid intake 48 hours prior to treatment and drink at least 2 quarts per day after treatment., No alcohol intake., No aspirin or other medications unless approved by your oncologist., Eat foods that are light and easy to digest., Eat foods at cold or room temperature., No fried, fatty, or spicy foods immediately before or after treatment., Have teeth cleaned professionally before starting treatment. Keep dentures and partial plates clean., Use soft toothbrush and do not use mouthwashes that contain alcohol. Biotene is a good mouthwash that is available at most pharmacies or may be ordered by calling [phone]., Use warm salt water gargles (1 teaspoon salt per 1 quart warm water) before and after meals and at bedtime. Or you may rinse with 2 tablespoons of three -percent hydrogen peroxide mixed in eight ounces of water., Always use sunscreen with SPF (Sun Protection Factor) of 30 or higher., Use your nausea medication as directed to prevent nausea., Use your stool softener or laxative as directed to prevent constipation. and Use your anti-diarrheal medication as directed to stop diarrhea.   MEDICATIONS: You have been given prescriptions for the following medications: Continue taking pre and post chemo medications as you are already doing.     SYMPTOMS TO REPORT AS SOON AS POSSIBLE AFTER TREATMENT:  FEVER GREATER THAN 100.5 F  CHILLS WITH OR WITHOUT FEVER  NAUSEA AND VOMITING THAT IS NOT CONTROLLED WITH YOUR NAUSEA MEDICATION  UNUSUAL SHORTNESS OF BREATH  UNUSUAL BRUISING OR BLEEDING  TENDERNESS IN MOUTH AND THROAT WITH OR WITHOUT PRESENCE OF ULCERS  URINARY PROBLEMS  BOWEL PROBLEMS  UNUSUAL RASH    Wear comfortable clothing and clothing appropriate for easy access to any Portacath or PICC line. Let us know if there is anything that we can do to make your therapy better!      I have been informed and understand all of the instructions given to me and have received a copy. I have been instructed to call the clinic [phone] or my family physician as soon as possible for continued medical care, if indicated. I do not have any more questions at this time but understand that I may call the Cancer Center or the Patient Navigator at [phone] during office hours should I have questions or need assistance in obtaining follow-up care.      _________________________________________      _______________     __________ Signature of Patient or Authorized Representative        Date                            Time      _________________________________________ Nurse's Signature

## 2012-10-17 ENCOUNTER — Ambulatory Visit (HOSPITAL_COMMUNITY): Payer: PRIVATE HEALTH INSURANCE

## 2012-10-19 ENCOUNTER — Telehealth (HOSPITAL_COMMUNITY): Payer: Self-pay | Admitting: *Deleted

## 2012-10-20 ENCOUNTER — Encounter (HOSPITAL_BASED_OUTPATIENT_CLINIC_OR_DEPARTMENT_OTHER): Payer: PRIVATE HEALTH INSURANCE

## 2012-10-20 ENCOUNTER — Ambulatory Visit (HOSPITAL_COMMUNITY): Payer: PRIVATE HEALTH INSURANCE | Admitting: Oncology

## 2012-10-20 ENCOUNTER — Other Ambulatory Visit (HOSPITAL_COMMUNITY): Payer: PRIVATE HEALTH INSURANCE

## 2012-10-20 VITALS — BP 111/66 | HR 76 | Temp 97.9°F | Resp 16 | Wt 203.8 lb

## 2012-10-20 DIAGNOSIS — C8190 Hodgkin lymphoma, unspecified, unspecified site: Secondary | ICD-10-CM

## 2012-10-20 DIAGNOSIS — C8119 Nodular sclerosis classical Hodgkin lymphoma, extranodal and solid organ sites: Secondary | ICD-10-CM

## 2012-10-20 DIAGNOSIS — Z5111 Encounter for antineoplastic chemotherapy: Secondary | ICD-10-CM

## 2012-10-20 MED ORDER — VINBLASTINE SULFATE CHEMO INJECTION 1 MG/ML
6.0000 mg/m2 | Freq: Once | INTRAVENOUS | Status: AC
  Administered 2012-10-20: 13 mg via INTRAVENOUS
  Filled 2012-10-20: qty 13

## 2012-10-20 MED ORDER — HEPARIN SOD (PORK) LOCK FLUSH 100 UNIT/ML IV SOLN
500.0000 [IU] | Freq: Once | INTRAVENOUS | Status: AC | PRN
  Administered 2012-10-20: 500 [IU]
  Filled 2012-10-20: qty 5

## 2012-10-20 MED ORDER — SODIUM CHLORIDE 0.9 % IJ SOLN
10.0000 mL | INTRAMUSCULAR | Status: DC | PRN
  Administered 2012-10-20: 10 mL
  Filled 2012-10-20: qty 10

## 2012-10-20 MED ORDER — SODIUM CHLORIDE 0.9 % IV SOLN
375.0000 mg/m2 | Freq: Once | INTRAVENOUS | Status: AC
  Administered 2012-10-20: 820 mg via INTRAVENOUS
  Filled 2012-10-20: qty 41

## 2012-10-20 MED ORDER — HEPARIN SOD (PORK) LOCK FLUSH 100 UNIT/ML IV SOLN
INTRAVENOUS | Status: AC
  Filled 2012-10-20: qty 5

## 2012-10-20 MED ORDER — SODIUM CHLORIDE 0.9 % IV SOLN
Freq: Once | INTRAVENOUS | Status: AC
  Administered 2012-10-20: 11:00:00 via INTRAVENOUS

## 2012-10-20 MED ORDER — SODIUM CHLORIDE 0.9 % IV SOLN
Freq: Once | INTRAVENOUS | Status: AC
  Administered 2012-10-20: 16 mg via INTRAVENOUS
  Filled 2012-10-20: qty 8

## 2012-10-20 MED ORDER — DOXORUBICIN HCL CHEMO IV INJECTION 2 MG/ML
25.0000 mg/m2 | Freq: Once | INTRAVENOUS | Status: AC
  Administered 2012-10-20: 54 mg via INTRAVENOUS
  Filled 2012-10-20: qty 27

## 2012-10-20 MED ORDER — SODIUM CHLORIDE 0.9 % IV SOLN
100.0000 mg/m2 | Freq: Once | INTRAVENOUS | Status: AC
  Administered 2012-10-20: 220 mg via INTRAVENOUS
  Filled 2012-10-20: qty 11

## 2012-10-21 ENCOUNTER — Encounter (HOSPITAL_BASED_OUTPATIENT_CLINIC_OR_DEPARTMENT_OTHER): Payer: PRIVATE HEALTH INSURANCE

## 2012-10-21 ENCOUNTER — Ambulatory Visit (HOSPITAL_COMMUNITY): Payer: PRIVATE HEALTH INSURANCE | Admitting: Oncology

## 2012-10-21 VITALS — Temp 97.7°F

## 2012-10-21 DIAGNOSIS — C8190 Hodgkin lymphoma, unspecified, unspecified site: Secondary | ICD-10-CM

## 2012-10-21 DIAGNOSIS — C8119 Nodular sclerosis classical Hodgkin lymphoma, extranodal and solid organ sites: Secondary | ICD-10-CM

## 2012-10-21 DIAGNOSIS — Z5189 Encounter for other specified aftercare: Secondary | ICD-10-CM

## 2012-10-21 MED ORDER — PEGFILGRASTIM INJECTION 6 MG/0.6ML
SUBCUTANEOUS | Status: AC
  Filled 2012-10-21: qty 0.6

## 2012-10-21 MED ORDER — PEGFILGRASTIM INJECTION 6 MG/0.6ML
6.0000 mg | Freq: Once | SUBCUTANEOUS | Status: AC
  Administered 2012-10-21: 6 mg via SUBCUTANEOUS

## 2012-10-21 NOTE — Progress Notes (Signed)
Samuel [name] presents today for injection per the provider's orders.  Neulasta administered administration without incident; see MAR for injection details.  Patient tolerated procedure well and without incident.  No questions or complaints noted at this time.  Samuel [name] also reports tolerating yesterday's chemo well - reports feeling tired today, but otherwise no complaints.

## 2012-10-22 ENCOUNTER — Encounter (HOSPITAL_COMMUNITY): Payer: PRIVATE HEALTH INSURANCE

## 2012-10-22 ENCOUNTER — Ambulatory Visit (INDEPENDENT_AMBULATORY_CARE_PROVIDER_SITE_OTHER)
Admission: RE | Admit: 2012-10-22 | Discharge: 2012-10-22 | Disposition: A | Discharge status: Home / Self Care | Payer: PRIVATE HEALTH INSURANCE | Source: Ambulatory Visit | Attending: Adult Health | Admitting: Adult Health

## 2012-10-22 ENCOUNTER — Ambulatory Visit (INDEPENDENT_AMBULATORY_CARE_PROVIDER_SITE_OTHER): Payer: PRIVATE HEALTH INSURANCE | Admitting: Adult Health

## 2012-10-22 ENCOUNTER — Encounter: Payer: Self-pay | Admitting: Adult Health

## 2012-10-22 VITALS — BP 124/70 | HR 101 | Temp 97.8°F | Ht 69.0 in | Wt 207.6 lb

## 2012-10-22 DIAGNOSIS — J189 Pneumonia, unspecified organism: Secondary | ICD-10-CM

## 2012-10-22 DIAGNOSIS — J9600 Acute respiratory failure, unspecified whether with hypoxia or hypercapnia: Secondary | ICD-10-CM

## 2012-10-22 DIAGNOSIS — I2699 Other pulmonary embolism without acute cor pulmonale: Secondary | ICD-10-CM

## 2012-10-22 DIAGNOSIS — J9601 Acute respiratory failure with hypoxia: Secondary | ICD-10-CM

## 2012-10-22 NOTE — Patient Instructions (Addendum)
Continue on prednisone 20mg  daily for 2 weeks then 10mg  daily until seen back in office  Continue on Lovenox injections daily  Wear Oxygen 2 l/m with activity and At bedtime   follow up Dr. Delford Field  In 4 weeks as planned and As needed

## 2012-10-22 NOTE — Progress Notes (Signed)
Subjective:    Patient ID: Samuel [name], male    DOB: Apr 18, 1976, 37 y.o.   MRN: [id number]  HPI 37 year old male with a known history of Hodgkin's lymphoma, undergoing active chemotherapy with initial PCCM consult on 09/28/2012 for respiratory distress. Patient was found to have acute respiratory failure, secondary to pulmonary infiltrates, possibly secondary to hospital-acquired pneumonia, versus drug reaction-Bleomycin and a RLL PE /R. DVT   10/22/2012 post hospital followup Patient was admitted February 3 through 10/06/2012 for acute respiratory failure secondary to progressive pulmonary infiltrates, possibly secondary to hospital-acquired pneumonia, versus drug reaction -bleomycin .  He was treated with aggressive IV antibiotics, pulmonary hygiene . He did require brief bilevel support. There was an infectious disease consult. Given empiric Bactrim for possible pneumocystis. Patient was placed on systemic steroids for concern for bleomycin toxicity. Patient was discharged on a slow prednisone taper , and hold on 20 mg daily. He was discharged on Bactrim for 2 weeks. He was started on Lovenox therapy with daily injections. He was also discharged on oxygen therapy. 3 L at rest and 4 L with activity.  Since discharge. Patient does report he is feeling improved with decreased cough, shortness of breath. He denies any chest pain, hemoptysis, orthopnea, PND, or increased leg swelling. Chest x-ray shows bilateral patchy airspace disease, left greater than right. This is improved since discharge. He denies any fever, nausea, vomiting, or diarrhea. Patient has restarted chemotherapy. He does report he has not been using his oxygen for greater than 4 days. Ambulatory walk in the office today shows desaturations of 86-88% on room air. Patient's been encouraged to restart his oxygen with activity and at bedtime. At room air at rest. Patient does have O2 saturation of 95%. He is very resistant to  oxygen therapy despite pt education on dangers of hypoxia.    Review of Systems Constitutional:   No  weight loss, night sweats,  Fevers, chills,  +fatigue, or  lassitude.  HEENT:   No headaches,  Difficulty swallowing,  Tooth/dental problems, or  Sore throat,                No sneezing, itching, ear ache, nasal congestion, post nasal drip,   CV:  No chest pain,  Orthopnea, PND, swelling in lower extremities, anasarca, dizziness, palpitations, syncope.   GI  No heartburn, indigestion, abdominal pain, nausea, vomiting, diarrhea, change in bowel habits, loss of appetite, bloody stools.   Resp:  No coughing up of blood.  No change in color of mucus.  No wheezing.  No chest wall deformity  Skin: no rash or lesions.  GU: no dysuria, change in color of urine, no urgency or frequency.  No flank pain, no hematuria   MS:  No joint pain or swelling.  No decreased range of motion.  No back pain.  Psych:  No change in mood or affect. No depression or anxiety.  No memory loss.         Objective:   Physical Exam GEN: A/Ox3; pleasant , NAD, well nourished   HEENT:  [place]/AT,  EACs-clear, TMs-wnl, NOSE-clear, THROAT-clear, no lesions, no postnasal drip or exudate noted.   NECK:  Supple w/ fair ROM; no JVD; normal carotid impulses w/o bruits; no thyromegaly or nodules palpated; no lymphadenopathy.  RESP  Clear  P & A; w/o, wheezes/ rales/ or rhonchi.no accessory muscle use, no dullness to percussion  CARD:  RRR, no m/r/g  , no peripheral edema, pulses intact, no cyanosis or clubbing.  GI:  Soft & nt; nml bowel sounds; no organomegaly or masses detected.  Musco: Warm bil, no deformities or joint swelling noted.   Neuro: alert, no focal deficits noted.    Skin: Warm, no lesions or rashes         Assessment & Plan:

## 2012-10-22 NOTE — Assessment & Plan Note (Signed)
Bilateral pulmonary infiltrates, secondary to possible hospital-acquired pneumonia, versus drug toxicity with bleomycin. Patient is clinically, improving. Chest x-ray does show decreased pulmonary infiltrates. Since discharge. He continues to have some desaturations with ambulation Have advised him to wear oxygen 2 L with activity and at bedtime. Will slowly taper prednisone. He will do 20 mg daily for 2 weeks and then decrease to 10 mg daily until seen back in, the office in 4 weeks.

## 2012-10-22 NOTE — Assessment & Plan Note (Signed)
Patient's continue on Lovenox injections daily. Follow up here in the office in 4 weeks and as needed with Dr. Delford Field

## 2012-10-26 ENCOUNTER — Ambulatory Visit (HOSPITAL_COMMUNITY): Payer: PRIVATE HEALTH INSURANCE

## 2012-10-27 ENCOUNTER — Ambulatory Visit (HOSPITAL_COMMUNITY): Payer: PRIVATE HEALTH INSURANCE

## 2012-10-30 ENCOUNTER — Inpatient Hospital Stay (HOSPITAL_COMMUNITY): Payer: PRIVATE HEALTH INSURANCE

## 2012-11-02 NOTE — Progress Notes (Signed)
Samuel Punt, MD [address] [place] [address]  Hodgkin's disease  CURRENT THERAPY:S/P 3 1/2 cycles of ABVD with Etoposide substitutes for bleomycin for Day 15 of cycle 4.  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  Presently starting cycle 5 of chemotherapy with Neulasta support.  INTERVAL HISTORY: Samuel [name] 37 y.o. 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 He had a one-week delay with cycle 1 due to low blood counts, finally agreed to Neulasta. He did achieve a complete remission by cycle 3.  Samuel [name] is here for follow-up.  He has been following up with pulmonology and notes are appreciated.  He reports that he is still taking 20 mg PO daily of Prednisone and is unsure if he is to continue this.  He has 4-5 tablets left he reports.  I reviewed pulmonology note and educated the patient that he is to take 20 mg PO daily of prednisone for 2 weeks and then decrease to 10 mg daily until seen in follow-up on 3/28.  I do not know the date he started the 20 mg daily so I will escribe him another Rx for 10 mg of Prednisone daily after completing 2 weeks worth of 20 mg.  [facility]. I will defer future management of Prednisone to pulmonology.   I will have his PET scan rescheduled for 8 weeks after completing chemotherapy for restaging.  He was in a CR on his last PET scan following cycle 3 of chemotherapy.   He reports facial swelling and he is noted to have moon face secondary to steroids.  He reports that there was an instance of right angle of the jaw swelling with a "hard knot" in it.  He contact a physician who encouraged him to take Benadryl for this.  He took Benadryl for 4 days and it resolved.   He noted increased right angle of the jaw swelling again without a hard knot a week later.  He again took benadryl and it resolved.  He denies a sore throat, painful tooth, painful gums, URI-like symptoms, earache.  This complaint is nonspecific and physical exam does not demonstrate anything worrisome at this time.  We will continue to follow.   Samuel [name] has gained some weight due to an increased appetite secondary to steroids.  He is trying to choose healthier eating options including fruits and vegetables.   We will need to perform a PFT test in the near future when he has completed his course of steroids.  If one is not ordered when he sees Dr. Delford Field on 3/28, we will be glad to order when he follows up at the clinic.  Hematologically, he denies any complaints and ROS questioning is negative.    Past Medical History  Diagnosis Date  . Mass of left side of neck 04/2012  . 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 Hodgkin's disease   . Hodgkin lymphoma   . Diastolic dysfunction     Grade 2  . Acute respiratory failure with hypoxia 09/29/2012  . Pulmonary emboli 09/29/2012    has Hodgkin's disease; HCAP (healthcare-associated pneumonia); Thrombocytopenia; Anemia; Pleural effusion on right; Pulmonary emboli; Acute  respiratory failure with hypoxia; Hyperglycemia; Former cigarette smoker; and Weight gain due to medication on his problem list.     is allergic to bleomycin and amoxicillin.  Mr. Cowden had no medications administered during this visit.  Past Surgical History  Procedure Laterality Date  . Eye surgery  as a child  . Mass biopsy  05/05/2012    Procedure: NECK MASS BIOPSY;  Surgeon: Samuel Moll, MD;  Location: [facility];  Service: ENT;  Laterality: Left;  Excisional biopsy of left neck mass   . Portacath placement  06/03/2012    Procedure: INSERTION PORT-A-CATH;  Surgeon: Samuel Heading, MD;  Location: [facility];  Service: General;  Laterality:  Left;  Insertion of Port-A-Cath Left Subclavian  . Bone marrow biopsy    . Bone marrow aspiration      Denies any headaches, dizziness, double vision, fevers, chills, night sweats, nausea, vomiting, diarrhea, constipation, chest pain, heart palpitations, shortness of breath, blood in stool, black tarry stool, urinary pain, urinary burning, urinary frequency, hematuria.   PHYSICAL EXAMINATION  ECOG PERFORMANCE STATUS: 1 - Symptomatic but completely ambulatory  There were no vitals filed for this visit.  GENERAL:alert, no distress, well nourished, well developed, comfortable, cooperative and smiling SKIN: skin color, texture, turgor are normal, no rashes or significant lesions HEAD: No masses, lesions, tenderness or abnormalities, moon face EYES: normal, Conjunctiva are pink and non-injected EARS: External ears normal, Canals clear, TM's Normal, R TM normal and shiny and non-erythematous, L TM  normal and shiny and non-erythematous OROPHARYNX:no exudate, no erythema, lips, buccal mucosa, and tongue normal, dentition normal and mucous membranes are moist  NECK: supple, no adenopathy, thyroid normal size, non-tender, without nodularity, no stridor, non-tender, trachea midline, B/L facial edema with swelling at the angle of the jaw, but no obvious lymphadenopathy noted or abnormalities. LYMPH:  no palpable lymphadenopathy, no hepatosplenomegaly BREAST:not examined LUNGS: clear to auscultation and percussion HEART: regular rate & rhythm, no murmurs, no gallops, S1 normal and S2 normal ABDOMEN:abdomen soft, non-tender, obese, normal bowel sounds, no masses or organomegaly and no hepatosplenomegaly BACK: Back symmetric, no curvature., No CVA tenderness EXTREMITIES:less then 2 second capillary refill, no joint deformities, effusion, or inflammation, no edema, no skin discoloration, no clubbing, no cyanosis  NEURO: alert & oriented x 3 with fluent speech, no focal motor/sensory deficits, gait  normal    LABORATORY DATA: CBC    Component Value Date/Time   WBC 8.1 11/03/2012 0929   RBC 3.95* 11/03/2012 0929   HGB 11.6* 11/03/2012 0929   HCT 36.1* 11/03/2012 0929   PLT 130* 11/03/2012 0929   MCV 91.4 11/03/2012 0929   MCH 29.4 11/03/2012 0929   MCHC 32.1 11/03/2012 0929   RDW 19.6* 11/03/2012 0929   LYMPHSABS 2.6 11/03/2012 0929   MONOABS 0.9 11/03/2012 0929   EOSABS 0.0 11/03/2012 0929   BASOSABS 0.0 11/03/2012 0929      Chemistry      Component Value Date/Time   NA 141 11/03/2012 0929   K 3.5 11/03/2012 0929   CL 103 11/03/2012 0929   CO2 27 11/03/2012 0929   BUN 19 11/03/2012 0929   CREATININE 0.83 11/03/2012 0929      Component Value Date/Time   CALCIUM 9.0 11/03/2012 0929   ALKPHOS 59 11/03/2012 0929   AST 18 11/03/2012 0929   ALT 53 11/03/2012 0929   BILITOT 0.3 11/03/2012 0929        ASSESSMENT:  1. Stage II A. classic nodular sclerosing Hodgkin's  disease status post 3 1/2 cycles of ABVD with Etoposide being substituted for Day 15 of cycle 4 and this substitution will continue for the remaining treatments. He had a one-week delay with cycle 1 due to low blood counts, finally agreed to Neulasta. He did achieve a complete remission by cycle 3 on PET scan.  2. 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 This required hospitalization. On steroid taper per pulmonologist. 3. Facial swelling, secondary to steroid use 4. Weight gain, secondary to increased appetite from steroids.    PLAN:  1. I personally reviewed and went over laboratory results with the patient. 2. I personally reviewed and went over radiographic studies with the patient. 3. Pre-chemo labs ordered as standing orders: CBC diff, CMET, ESR, LDH 4. PET scan will be performed 8 weeks after completion of  chemotherapy in light of a CR by PET scan after cycle 3 of chemotherapy.  5. Continue with chemotherapy as scheduled 6. Patient informed about his Prednisone taper: 20 mg daily x 14 days, and then 10 mg daily until seen by pulmonologist 7. [facility] to be started after 14 days of 20 mg Prednisone 8. Message sent to Dr. Delford Field.  We can perform PFT before he is seen or wait until after seen by Dr. Delford Field.  Will defer to Dr. Lynelle Doctor judgement. 9. Return in 4 weeks for follow-up   All questions were answered. The patient knows to call the clinic with any problems, questions or concerns. We can certainly see the patient much sooner if necessary.  The patient and plan discussed with Glenford Peers, MD and he is in agreement with the aforementioned.  KEFALAS,THOMAS

## 2012-11-03 ENCOUNTER — Encounter (HOSPITAL_COMMUNITY): Payer: PRIVATE HEALTH INSURANCE | Attending: Oncology | Admitting: Oncology

## 2012-11-03 ENCOUNTER — Encounter (HOSPITAL_COMMUNITY): Payer: PRIVATE HEALTH INSURANCE

## 2012-11-03 ENCOUNTER — Encounter (HOSPITAL_BASED_OUTPATIENT_CLINIC_OR_DEPARTMENT_OTHER): Payer: PRIVATE HEALTH INSURANCE

## 2012-11-03 ENCOUNTER — Other Ambulatory Visit (HOSPITAL_COMMUNITY): Payer: Self-pay | Admitting: Oncology

## 2012-11-03 VITALS — BP 123/78 | HR 85 | Temp 97.4°F | Resp 16 | Wt 211.8 lb

## 2012-11-03 DIAGNOSIS — C8190 Hodgkin lymphoma, unspecified, unspecified site: Secondary | ICD-10-CM

## 2012-11-03 DIAGNOSIS — C8119 Nodular sclerosis classical Hodgkin lymphoma, extranodal and solid organ sites: Secondary | ICD-10-CM

## 2012-11-03 DIAGNOSIS — J189 Pneumonia, unspecified organism: Secondary | ICD-10-CM

## 2012-11-03 DIAGNOSIS — Z5111 Encounter for antineoplastic chemotherapy: Secondary | ICD-10-CM

## 2012-11-03 LAB — COMPREHENSIVE METABOLIC PANEL
AST: 18 U/L (ref 0–37)
Albumin: 3.4 g/dL — ABNORMAL LOW (ref 3.5–5.2)
Alkaline Phosphatase: 59 U/L (ref 39–117)
Chloride: 103 mEq/L (ref 96–112)
Potassium: 3.5 mEq/L (ref 3.5–5.1)
Total Bilirubin: 0.3 mg/dL (ref 0.3–1.2)

## 2012-11-03 LAB — CBC WITH DIFFERENTIAL/PLATELET
Basophils Absolute: 0 10*3/uL (ref 0.0–0.1)
Basophils Relative: 1 % (ref 0–1)
MCHC: 32.1 g/dL (ref 30.0–36.0)
Neutro Abs: 4.5 10*3/uL (ref 1.7–7.7)
Neutrophils Relative %: 56 % (ref 43–77)
Platelets: 130 10*3/uL — ABNORMAL LOW (ref 150–400)
RDW: 19.6 % — ABNORMAL HIGH (ref 11.5–15.5)

## 2012-11-03 MED ORDER — HEPARIN SOD (PORK) LOCK FLUSH 100 UNIT/ML IV SOLN
500.0000 [IU] | Freq: Once | INTRAVENOUS | Status: AC | PRN
  Administered 2012-11-03: 500 [IU]
  Filled 2012-11-03: qty 5

## 2012-11-03 MED ORDER — SODIUM CHLORIDE 0.9 % IJ SOLN
10.0000 mL | INTRAMUSCULAR | Status: DC | PRN
  Administered 2012-11-03: 10 mL
  Filled 2012-11-03: qty 10

## 2012-11-03 MED ORDER — SODIUM CHLORIDE 0.9 % IV SOLN
375.0000 mg/m2 | Freq: Once | INTRAVENOUS | Status: AC
  Administered 2012-11-03: 820 mg via INTRAVENOUS
  Filled 2012-11-03: qty 41

## 2012-11-03 MED ORDER — SODIUM CHLORIDE 0.9 % IV SOLN
Freq: Once | INTRAVENOUS | Status: AC
  Administered 2012-11-03: 500 mL via INTRAVENOUS

## 2012-11-03 MED ORDER — SODIUM CHLORIDE 0.9 % IV SOLN
100.0000 mg/m2 | Freq: Once | INTRAVENOUS | Status: AC
  Administered 2012-11-03: 220 mg via INTRAVENOUS
  Filled 2012-11-03: qty 11

## 2012-11-03 MED ORDER — PREDNISONE 20 MG PO TABS: ORAL_TABLET | ORAL | Status: DC

## 2012-11-03 MED ORDER — VINBLASTINE SULFATE CHEMO INJECTION 1 MG/ML
6.0000 mg/m2 | Freq: Once | INTRAVENOUS | Status: AC
  Administered 2012-11-03: 13 mg via INTRAVENOUS
  Filled 2012-11-03: qty 13

## 2012-11-03 MED ORDER — SODIUM CHLORIDE 0.9 % IV SOLN
Freq: Once | INTRAVENOUS | Status: AC
  Administered 2012-11-03: 16 mg via INTRAVENOUS
  Filled 2012-11-03: qty 8

## 2012-11-03 MED ORDER — PREDNISONE 20 MG PO TABS: 10.0000 mg | ORAL_TABLET | Freq: Every day | ORAL | Status: DC

## 2012-11-03 MED ORDER — HEPARIN SOD (PORK) LOCK FLUSH 100 UNIT/ML IV SOLN
INTRAVENOUS | Status: AC
  Filled 2012-11-03: qty 5

## 2012-11-03 MED ORDER — DOXORUBICIN HCL CHEMO IV INJECTION 2 MG/ML
25.0000 mg/m2 | Freq: Once | INTRAVENOUS | Status: AC
  Administered 2012-11-03: 54 mg via INTRAVENOUS
  Filled 2012-11-03: qty 27

## 2012-11-03 NOTE — Patient Instructions (Addendum)
[facility] Discharge Instructions  RECOMMENDATIONS MADE BY THE CONSULTANT AND ANY TEST RESULTS WILL BE SENT TO YOUR REFERRING PHYSICIAN.  Continue current chemotherapy plan as scheduled. PET scan will be rescheduled for after completion of chemotherapy. Return to clinic in 4 weeks to see PA. MD appointment after PET scan.  INSTRUCTIONS FOR PREDNISONE: Prednisone 20 mg daily for 2 weeks (from the time you started it at home). On day 15 start Prednisone 10 mg daily until you go back to see Dr.Wright. (a new prescription has been sent to your pharmacy)   Thank you for choosing [facility] to provide your oncology and hematology care.  To afford each patient quality time with our providers, please arrive at least 15 minutes before your scheduled appointment time.  With your help, our goal is to use those 15 minutes to complete the necessary work-up to ensure our physicians have the information they need to help with your evaluation and healthcare recommendations.    Effective January 1st, 2014, we ask that you re-schedule your appointment with our physicians should you arrive 10 or more minutes late for your appointment.  We strive to give you quality time with our providers, and arriving late affects you and other patients whose appointments are after yours.    Again, thank you for choosing [facility].  Our hope is that these requests will decrease the amount of time that you wait before being seen by our physicians.       _____________________________________________________________  Should you have questions after your visit to [facility], please contact our office at [phone] between the hours of 8:30 a.m. and 5:00 p.m.  Voicemails left after 4:30 p.m. will not be returned until the following business day.  For prescription refill requests, have your pharmacy contact our office with your prescription refill request.

## 2012-11-03 NOTE — Progress Notes (Signed)
Tolerated chemo well.  [place] [address] accompanied by spouse.

## 2012-11-04 ENCOUNTER — Encounter (HOSPITAL_BASED_OUTPATIENT_CLINIC_OR_DEPARTMENT_OTHER): Payer: PRIVATE HEALTH INSURANCE

## 2012-11-04 ENCOUNTER — Telehealth: Payer: Self-pay | Admitting: Critical Care Medicine

## 2012-11-04 ENCOUNTER — Ambulatory Visit (HOSPITAL_COMMUNITY)
Admission: RE | Admit: 2012-11-04 | Discharge: 2012-11-04 | Disposition: A | Discharge status: Home / Self Care | Payer: PRIVATE HEALTH INSURANCE | Source: Ambulatory Visit | Attending: Oncology | Admitting: Oncology

## 2012-11-04 VITALS — BP 121/56 | HR 80 | Temp 98.3°F | Resp 18

## 2012-11-04 DIAGNOSIS — C8190 Hodgkin lymphoma, unspecified, unspecified site: Secondary | ICD-10-CM

## 2012-11-04 DIAGNOSIS — Z9221 Personal history of antineoplastic chemotherapy: Secondary | ICD-10-CM | POA: Insufficient documentation

## 2012-11-04 DIAGNOSIS — C8119 Nodular sclerosis classical Hodgkin lymphoma, extranodal and solid organ sites: Secondary | ICD-10-CM

## 2012-11-04 DIAGNOSIS — I517 Cardiomegaly: Secondary | ICD-10-CM

## 2012-11-04 DIAGNOSIS — Z5189 Encounter for other specified aftercare: Secondary | ICD-10-CM

## 2012-11-04 MED ORDER — PEGFILGRASTIM INJECTION 6 MG/0.6ML
6.0000 mg | Freq: Once | SUBCUTANEOUS | Status: AC
  Administered 2012-11-04: 6 mg via SUBCUTANEOUS

## 2012-11-04 MED ORDER — PEGFILGRASTIM INJECTION 6 MG/0.6ML
SUBCUTANEOUS | Status: AC
  Filled 2012-11-04: qty 0.6

## 2012-11-04 NOTE — Telephone Encounter (Signed)
He is on lovenox indefinitely d/t DVT and d/t fact he has lymphoma Get PA

## 2012-11-04 NOTE — Telephone Encounter (Signed)
Patient discharged from hospital 10/06/12 on 140mg  Lovenox daily per Gerda Diss, NP. Seen by TP on 10/22/12 and told to continue on Lovenox daily.  Spoke with Elon Jester at [employer]. She states that per insurance patient can only receive 35 injection per every 180 days and patient will receive last 5 today.  Spoke with Rose who is with patients insurance company and patient will need this medication prior authorized.  Dr. Delford Field, do you want to keep patient on this medication and attempt prior authorization or alternative medication?  Insurance Company:[id number] Prior Auth #: [id number]

## 2012-11-04 NOTE — Progress Notes (Signed)
*  PRELIMINARY RESULTS* Echocardiogram 2D Echocardiogram has been performed.  Conrad Brimhall Nizhoni 11/04/2012, 2:13 PM

## 2012-11-04 NOTE — Progress Notes (Signed)
Samuel [name] presents today for injection per MD orders. Neulasta 6mg  administered SQ in left Abdomen. Administration without incident. Patient tolerated well.

## 2012-11-04 NOTE — Telephone Encounter (Signed)
[employer], spoke with [place].  She will fax PA form to triage.   Reference # [id number]. Will await fax to triage. Marcelino Duster with [facility] aware we have initiated PA.  She is requesting call back once this has been approved or denied.  Advised we will do this.  Marcelino Duster will contact pt to advise we have initiated PA.

## 2012-11-04 NOTE — Telephone Encounter (Signed)
PA form received and placed in PW's folder for signature and review.  Will route msg to him as FYI and to my box to f/u on.

## 2012-11-05 NOTE — Telephone Encounter (Signed)
noted 

## 2012-11-06 ENCOUNTER — Telehealth (HOSPITAL_COMMUNITY): Payer: Self-pay | Admitting: *Deleted

## 2012-11-06 MED ORDER — WARFARIN SODIUM 5 MG PO TABS: 5.0000 mg | ORAL_TABLET | Freq: Every day | ORAL | Status: DC

## 2012-11-06 NOTE — Progress Notes (Unsigned)
Medication added to med list (coumadin) on 11/06/12

## 2012-11-06 NOTE — Telephone Encounter (Signed)
Patient to start Coumadin 5mg  daily starting tonight. #30 [facility]. To return on Monday 11/09/12 for INR. To continue the rest of his lovenox injections. He has 4 left.   Patient stated that insurance let him know that they would not cover anymore Lovenox for 180 days. Patient to continue his last 4 injections and start Coumadin this evening. Patient was not started on Coumadin earlier than this because he was on antibiotics and we did not want to get inaccurate INR readings. I instructed patient to avoid a diet high in Vit K. Pt to look up Coumadin diet on the internet. I instructed him that if he ate collard greens or turnip greens to do it only in moderation meaning if he only ate it here and there as he states that he does to continue only eating it here and there and to also only eat a serving size (the palm of his hand). I will give him written instructions regarding a "Coumadin diet" when he comes in on Monday for his labwork. He said he would look it up on the internet however.   Pt states that he understands he is to continue his Lovenox injections and start Coumadin.   Patient also states that a nurse is bringing out a blood monitoring machine but can't remember what that machine is for exactly. She is bringing it to his house on Monday. He is to let me know what the machine is for. I am assuming at this point that it is an INR machine.

## 2012-11-06 NOTE — Telephone Encounter (Addendum)
Received approval letter from [employer] - Approved from 11/04/2012 - 11/04/2013.   Joni Reining with [facility] aware. lmomtcb for pt to inform pt.

## 2012-11-06 NOTE — Telephone Encounter (Signed)
[employer] Approval Letter given to front staff to scan into chart.

## 2012-11-09 ENCOUNTER — Inpatient Hospital Stay (HOSPITAL_COMMUNITY): Payer: PRIVATE HEALTH INSURANCE

## 2012-11-09 ENCOUNTER — Encounter (HOSPITAL_BASED_OUTPATIENT_CLINIC_OR_DEPARTMENT_OTHER): Payer: PRIVATE HEALTH INSURANCE

## 2012-11-09 DIAGNOSIS — C8119 Nodular sclerosis classical Hodgkin lymphoma, extranodal and solid organ sites: Secondary | ICD-10-CM

## 2012-11-09 DIAGNOSIS — C8190 Hodgkin lymphoma, unspecified, unspecified site: Secondary | ICD-10-CM

## 2012-11-09 LAB — CBC WITH DIFFERENTIAL/PLATELET
Eosinophils Relative: 0 % (ref 0–5)
HCT: 39 % (ref 39.0–52.0)
Hemoglobin: 12.8 g/dL — ABNORMAL LOW (ref 13.0–17.0)
Lymphocytes Relative: 35 % (ref 12–46)
MCV: 89.7 fL (ref 78.0–100.0)
Monocytes Absolute: 0.2 10*3/uL (ref 0.1–1.0)
Monocytes Relative: 4 % (ref 3–12)
Neutro Abs: 3.4 10*3/uL (ref 1.7–7.7)
RDW: 18.4 % — ABNORMAL HIGH (ref 11.5–15.5)
WBC: 5.6 10*3/uL (ref 4.0–10.5)

## 2012-11-09 LAB — COMPREHENSIVE METABOLIC PANEL
BUN: 15 mg/dL (ref 6–23)
CO2: 25 mEq/L (ref 19–32)
Calcium: 9.6 mg/dL (ref 8.4–10.5)
Chloride: 99 mEq/L (ref 96–112)
Creatinine, Ser: 0.86 mg/dL (ref 0.50–1.35)
GFR calc Af Amer: >90 mL/min (ref >90)
GFR calc non Af Amer: >90 mL/min (ref >90)
Glucose, Bld: 87 mg/dL (ref 70–99)
Total Bilirubin: 0.4 mg/dL (ref 0.3–1.2)

## 2012-11-09 LAB — SEDIMENTATION RATE: Sed Rate: 12 mm/hr (ref 0–16)

## 2012-11-09 LAB — LACTATE DEHYDROGENASE: LDH: 204 U/L (ref 94–250)

## 2012-11-09 NOTE — Progress Notes (Signed)
Labs drawn today for cbc/diff,cmp,ldh,sed rate 

## 2012-11-10 ENCOUNTER — Ambulatory Visit (HOSPITAL_COMMUNITY): Payer: PRIVATE HEALTH INSURANCE

## 2012-11-10 ENCOUNTER — Telehealth (HOSPITAL_COMMUNITY): Payer: Self-pay | Admitting: *Deleted

## 2012-11-10 NOTE — Telephone Encounter (Signed)
Current INR is 1.2.  Orders: Coumadin 7.5mg  x 3 days. Then alternate 5mg /7.5mg . Recheck INR at home in 1 week.  Pt repeated instructions back to me. Documented also on Coumadin flowsheet.

## 2012-11-10 NOTE — Telephone Encounter (Signed)
lmomtcb for pt 

## 2012-11-11 ENCOUNTER — Telehealth: Payer: Self-pay | Admitting: Critical Care Medicine

## 2012-11-11 NOTE — Telephone Encounter (Signed)
Noted and I am fine with this change

## 2012-11-11 NOTE — Telephone Encounter (Signed)
Please see phone msg from 11/04/12 for additional information.  Called pt to inform him:  Raford Pitcher, RN at 11/06/2012 11:55 AM   Status: Addendum            Received approval letter from [employer] - Approved from 11/04/2012 - 11/04/2013.  Joni Reining with [facility] aware.  lmomtcb for pt to inform pt.        --------  Called, spoke with pt.  I informed him of above.  [facility] states [facility] did advise him that they called "a doctor" about the lovenox but they didn't tell him which dr.  Rock Nephew states he was unsure who to call or where to go from there, so he called his Oncologist to get further recs.  They have put him on coumadin.  Pt took last lovenox yesterday.  Pt states he has a blood monitoring machine at home to check his levels.  States the nurse at the oncology office has called him to inform him how to take coumadin.  Will route to PW as FYI regarding lovenox and coumadin.  ** Pt does have a pending OV with PW on November 20, 2012 at 4:30 pm.  Pt aware of pending OV.

## 2012-11-12 ENCOUNTER — Ambulatory Visit (HOSPITAL_COMMUNITY)
Admission: RE | Admit: 2012-11-12 | Discharge: 2012-11-12 | Disposition: A | Discharge status: Home / Self Care | Payer: PRIVATE HEALTH INSURANCE | Source: Ambulatory Visit | Attending: Oncology | Admitting: Oncology

## 2012-11-12 DIAGNOSIS — J189 Pneumonia, unspecified organism: Secondary | ICD-10-CM

## 2012-11-12 DIAGNOSIS — R0602 Shortness of breath: Secondary | ICD-10-CM | POA: Insufficient documentation

## 2012-11-12 DIAGNOSIS — C8190 Hodgkin lymphoma, unspecified, unspecified site: Secondary | ICD-10-CM | POA: Insufficient documentation

## 2012-11-12 LAB — PULMONARY FUNCTION TEST

## 2012-11-12 MED ORDER — ALBUTEROL SULFATE (5 MG/ML) 0.5% IN NEBU
2.5000 mg | INHALATION_SOLUTION | Freq: Once | RESPIRATORY_TRACT | Status: AC
  Administered 2012-11-12: 2.5 mg via RESPIRATORY_TRACT

## 2012-11-13 NOTE — Procedures (Signed)
NAME:  Samuel [name], Samuel [name]                 ACCOUNT NO.:  [id number]  MEDICAL RECORD NO.:  [id number]  LOCATION:  RESP                          FACILITY:  [facility]  PHYSICIAN:  Charlei Ramsaran L. Juanetta Gosling, M.D.DATE OF BIRTH:  Jan 07, 1976  DATE OF PROCEDURE: DATE OF DISCHARGE:  11/12/2012                           PULMONARY FUNCTION TEST   REASON FOR PULMONARY FUNCTION TESTING:  Hodgkin lymphoma.  1. Spirometry shows a mild-to-moderate ventilatory defect without     definite airflow obstruction. 2. Lung volumes are normal. 3. DLCO is moderately reduced and corrects somewhat when ventilation     is taken into account. 4. Airway resistance is slightly high. 5. There was no significant bronchodilator improvement.     Levan Aloia L. Juanetta Gosling, M.D.     ELH/MEDQ  D:  11/12/2012  T:  11/13/2012  Job:  [id number]  cc:   Ladona Horns. Mariel Sleet, MD Fax: [phone]

## 2012-11-15 ENCOUNTER — Emergency Department (HOSPITAL_COMMUNITY): Payer: PRIVATE HEALTH INSURANCE

## 2012-11-15 ENCOUNTER — Inpatient Hospital Stay (HOSPITAL_COMMUNITY)
Admission: EM | Admit: 2012-11-15 | Discharge: 2012-11-17 | DRG: 153 | Disposition: A | Discharge status: Home / Self Care | Payer: PRIVATE HEALTH INSURANCE | Attending: Family Medicine | Admitting: Family Medicine

## 2012-11-15 ENCOUNTER — Encounter (HOSPITAL_COMMUNITY): Payer: Self-pay

## 2012-11-15 DIAGNOSIS — Z9221 Personal history of antineoplastic chemotherapy: Secondary | ICD-10-CM

## 2012-11-15 DIAGNOSIS — C8190 Hodgkin lymphoma, unspecified, unspecified site: Secondary | ICD-10-CM

## 2012-11-15 DIAGNOSIS — Z87891 Personal history of nicotine dependence: Secondary | ICD-10-CM

## 2012-11-15 DIAGNOSIS — Z79899 Other long term (current) drug therapy: Secondary | ICD-10-CM

## 2012-11-15 DIAGNOSIS — Z6830 Body mass index (BMI) 30.0-30.9, adult: Secondary | ICD-10-CM

## 2012-11-15 DIAGNOSIS — I2699 Other pulmonary embolism without acute cor pulmonale: Secondary | ICD-10-CM | POA: Diagnosis present

## 2012-11-15 DIAGNOSIS — J069 Acute upper respiratory infection, unspecified: Principal | ICD-10-CM

## 2012-11-15 DIAGNOSIS — D696 Thrombocytopenia, unspecified: Secondary | ICD-10-CM

## 2012-11-15 DIAGNOSIS — E669 Obesity, unspecified: Secondary | ICD-10-CM | POA: Diagnosis present

## 2012-11-15 DIAGNOSIS — J189 Pneumonia, unspecified organism: Secondary | ICD-10-CM

## 2012-11-15 DIAGNOSIS — D649 Anemia, unspecified: Secondary | ICD-10-CM

## 2012-11-15 DIAGNOSIS — Z86711 Personal history of pulmonary embolism: Secondary | ICD-10-CM

## 2012-11-15 DIAGNOSIS — R739 Hyperglycemia, unspecified: Secondary | ICD-10-CM

## 2012-11-15 DIAGNOSIS — J90 Pleural effusion, not elsewhere classified: Secondary | ICD-10-CM

## 2012-11-15 DIAGNOSIS — T50905A Adverse effect of unspecified drugs, medicaments and biological substances, initial encounter: Secondary | ICD-10-CM

## 2012-11-15 DIAGNOSIS — Z7901 Long term (current) use of anticoagulants: Secondary | ICD-10-CM

## 2012-11-15 DIAGNOSIS — J9601 Acute respiratory failure with hypoxia: Secondary | ICD-10-CM

## 2012-11-15 DIAGNOSIS — R509 Fever, unspecified: Secondary | ICD-10-CM

## 2012-11-15 LAB — COMPREHENSIVE METABOLIC PANEL
AST: 21 U/L (ref 0–37)
Albumin: 3.8 g/dL (ref 3.5–5.2)
Alkaline Phosphatase: 90 U/L (ref 39–117)
BUN: 10 mg/dL (ref 6–23)
CO2: 26 mEq/L (ref 19–32)
Chloride: 97 mEq/L (ref 96–112)
Potassium: 3.8 mEq/L (ref 3.5–5.1)
Total Bilirubin: 0.6 mg/dL (ref 0.3–1.2)

## 2012-11-15 LAB — CBC WITH DIFFERENTIAL/PLATELET
Eosinophils Absolute: 0 10*3/uL (ref 0.0–0.7)
HCT: 35.5 % — ABNORMAL LOW (ref 39.0–52.0)
Hemoglobin: 11.8 g/dL — ABNORMAL LOW (ref 13.0–17.0)
Lymphs Abs: 1.7 10*3/uL (ref 0.7–4.0)
MCH: 29.6 pg (ref 26.0–34.0)
MCV: 89.2 fL (ref 78.0–100.0)
Monocytes Absolute: 1.8 10*3/uL — ABNORMAL HIGH (ref 0.1–1.0)
Monocytes Relative: 11 % (ref 3–12)
Neutrophils Relative %: 77 % (ref 43–77)
RBC: 3.98 MIL/uL — ABNORMAL LOW (ref 4.22–5.81)

## 2012-11-15 LAB — MAGNESIUM: Magnesium: 1.4 mg/dL — ABNORMAL LOW (ref 1.5–2.5)

## 2012-11-15 LAB — URINALYSIS, ROUTINE W REFLEX MICROSCOPIC
Bilirubin Urine: NEGATIVE
Glucose, UA: NEGATIVE mg/dL
Ketones, ur: NEGATIVE mg/dL
pH: 7 (ref 5.0–8.0)

## 2012-11-15 MED ORDER — POTASSIUM CHLORIDE IN NACL 20-0.9 MEQ/L-% IV SOLN
INTRAVENOUS | Status: DC
  Administered 2012-11-15 – 2012-11-17 (×3): via INTRAVENOUS

## 2012-11-15 MED ORDER — TRAZODONE HCL 50 MG PO TABS: 50.0000 mg | ORAL_TABLET | Freq: Every evening | ORAL | Status: DC | PRN

## 2012-11-15 MED ORDER — WARFARIN - PHARMACIST DOSING INPATIENT
Status: DC
  Administered 2012-11-16: 16:00:00

## 2012-11-15 MED ORDER — ACETAMINOPHEN 325 MG PO TABS
650.0000 mg | ORAL_TABLET | ORAL | Status: DC | PRN
  Administered 2012-11-16: 650 mg via ORAL
  Filled 2012-11-15: qty 2

## 2012-11-15 MED ORDER — ONDANSETRON HCL 4 MG/2ML IJ SOLN: 4.0000 mg | INTRAMUSCULAR | Status: DC | PRN

## 2012-11-15 MED ORDER — OXYCODONE HCL 5 MG PO TABS: 5.0000 mg | ORAL_TABLET | ORAL | Status: DC | PRN

## 2012-11-15 MED ORDER — SORBITOL 70 % SOLN: 30.0000 mL | Freq: Every day | Status: DC | PRN

## 2012-11-15 MED ORDER — DEXTROSE 5 % IV SOLN
2.0000 g | Freq: Once | INTRAVENOUS | Status: AC
  Administered 2012-11-15: 2 g via INTRAVENOUS
  Filled 2012-11-15: qty 2

## 2012-11-15 MED ORDER — HYDROMORPHONE HCL PF 1 MG/ML IJ SOLN: 0.5000 mg | INTRAMUSCULAR | Status: DC | PRN

## 2012-11-15 MED ORDER — ACETAMINOPHEN 500 MG PO TABS
1000.0000 mg | ORAL_TABLET | Freq: Once | ORAL | Status: AC
  Administered 2012-11-15: 1000 mg via ORAL
  Filled 2012-11-15: qty 2

## 2012-11-15 MED ORDER — PANTOPRAZOLE SODIUM 40 MG PO TBEC
40.0000 mg | DELAYED_RELEASE_TABLET | Freq: Every day | ORAL | Status: DC
  Administered 2012-11-15 – 2012-11-17 (×3): 40 mg via ORAL
  Filled 2012-11-15 (×3): qty 1

## 2012-11-15 MED ORDER — POLYETHYLENE GLYCOL 3350 17 G PO PACK: 17.0000 g | PACK | Freq: Every day | ORAL | Status: DC | PRN

## 2012-11-15 MED ORDER — SODIUM CHLORIDE 0.9 % IV BOLUS (SEPSIS)
1000.0000 mL | Freq: Once | INTRAVENOUS | Status: AC
  Administered 2012-11-15: 1000 mL via INTRAVENOUS

## 2012-11-15 MED ORDER — DEXTROSE 5 % IV SOLN
2.0000 g | Freq: Three times a day (TID) | INTRAVENOUS | Status: DC
  Administered 2012-11-16 – 2012-11-17 (×5): 2 g via INTRAVENOUS
  Filled 2012-11-15 (×8): qty 2

## 2012-11-15 MED ORDER — GUAIFENESIN ER 600 MG PO TB12
1200.0000 mg | ORAL_TABLET | Freq: Two times a day (BID) | ORAL | Status: DC
  Administered 2012-11-15 – 2012-11-16 (×3): 1200 mg via ORAL
  Filled 2012-11-15 (×3): qty 2

## 2012-11-15 MED ORDER — SODIUM CHLORIDE 0.9 % IV SOLN
INTRAVENOUS | Status: AC
  Administered 2012-11-15: 21:00:00 via INTRAVENOUS

## 2012-11-15 MED ORDER — DEXTROSE 5 % IV SOLN
INTRAVENOUS | Status: AC
  Filled 2012-11-15: qty 2

## 2012-11-15 MED ORDER — WARFARIN SODIUM 5 MG PO TABS: 5.0000 mg | ORAL_TABLET | Freq: Once | ORAL | Status: DC

## 2012-11-15 MED ORDER — PREDNISONE 20 MG PO TABS
20.0000 mg | ORAL_TABLET | Freq: Two times a day (BID) | ORAL | Status: DC
  Administered 2012-11-17: 20 mg via ORAL
  Filled 2012-11-15 (×3): qty 1

## 2012-11-15 MED ORDER — SIMETHICONE 80 MG PO CHEW: 80.0000 mg | CHEWABLE_TABLET | Freq: Four times a day (QID) | ORAL | Status: DC | PRN

## 2012-11-15 NOTE — Progress Notes (Signed)
ANTICOAGULATION CONSULT NOTE - Initial Consult  Pharmacy Consult for Warfarin Indication: VTE Treatment  Allergies  Allergen Reactions  . Bleomycin Other (See Comments)    Pt no longer receiving bleomycin d/t potential lung toxicity. Pt started Bleomycin on 06/09/12 and last dose was 09/14/12.  Marland Kitchen Amoxicillin Rash    Patient Measurements: Height: 5\' 9"  (175.3 cm) Weight: 207 lb 0.2 oz (93.9 kg) IBW/kg (Calculated) : 70.7  Vital Signs: Temp: 99 F (37.2 C) (03/23 2030) Temp src: Oral (03/23 2030) BP: 115/68 mmHg (03/23 2030) Pulse Rate: 101 (03/23 2030)  Labs:  Recent Labs  11/15/12 1552  HGB 11.8*  HCT 35.5*  PLT 140*  LABPROT 22.3*  INR 2.05*  CREATININE 1.03    Estimated Creatinine Clearance: 112.2 ml/min (by C-G formula based on Cr of 1.03).   Medical History: Past Medical History  Diagnosis Date  . Mass of left side of neck 04/2012  . 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 Hodgkin's disease   . Hodgkin lymphoma   . Diastolic dysfunction     Grade 2  . Acute respiratory failure with hypoxia 09/29/2012  . Pulmonary emboli 09/29/2012    Medications:  Scheduled:  . sodium chloride   Intravenous STAT  . [COMPLETED] acetaminophen  1,000 mg Oral Once  . [COMPLETED] ceftAZIDime (FORTAZ) IVPB 2 gram/50 mL D5W (Pyxis)  2 g Intravenous Once  . [START ON 11/16/2012] cefTAZidime (FORTAZ)  IV  2 g Intravenous Q8H  . guaiFENesin  1,200 mg Oral BID  . pantoprazole  40 mg Oral Daily  . [START ON 11/16/2012] predniSONE  20 mg Oral BID WC  . [COMPLETED] sodium chloride  1,000 mL Intravenous Once  . warfarin  5 mg Oral Once  . [START ON 11/16/2012] Warfarin - Pharmacist Dosing Inpatient   Does not apply Q24H    Assessment: Continuation of Warfarin from home PTA Coumadin 7.5 mg three times a week, 5 mg the other days Med Rec indicates 5 mg dose due today INR today 2.05  Goal of Therapy:  INR 2-3 Monitor platelets by anticoagulation  protocol: Yes   Plan:  Coumadin 5 mg po x 1 dose tonight INR/PT daily Labs per protocol  Raquel James, Seraphim Affinito Bennett 11/15/2012,10:18 PM

## 2012-11-15 NOTE — ED Notes (Addendum)
Pt reprots has hodkins lymphoma and for the past 3 days has been having runny nose, sinus congestion, and some cough.  Reports started having fever today.    Fever 101.8 at home today.  Pt says was in ICU for pneumonia Feb 5-12.  Pt c/o headache, rates at 1 or 2.  Pt receiving chemo.  Pt's next dose is supposed to be Tuesday.  Last treatment was 2 weeks ago TUesday.

## 2012-11-15 NOTE — ED Provider Notes (Signed)
History    This chart was scribed for [name], MD by Charolett Bumpers, ED Scribe. The patient was seen in room [facility]14[facility]14. Patient's care was started at 3:30 PM.   CSN: [id number]  Arrival date & time 11/15/12  1511   First MD Initiated Contact with Patient 11/15/12 1530      Chief Complaint  Patient presents with  . URI  . Fever    HPI Comments: Samuel [name] is a 37 y.o. male who has a h/o Hodgkin's lymphoma presents to the Emergency Department complaining of fever with associated sinus pressure, clear rhinorrhea, productive cough with yellow/green sputum and generalized weakness. He states the fever started today with his other symptoms gradually worsening over the past 3 days. Temperature here in ED is 100.7. He denies any nausea, vomiting, diarrhea, sore throat. He recieves chemotherapy treatments every two weeks for his hodgkin's lymphoma with his last treatment 2 weeks ago last Tuesday.   PCP: Dr. [name] Oncologist: Dr. Mariel Sleet  Patient is a 37 y.o. male presenting with URI. The history is provided by the patient. No language interpreter was used.  URI Presenting symptoms: cough, fever and rhinorrhea   Presenting symptoms: no fatigue and no sore throat   Onset quality:  Gradual Progression:  Worsening Chronicity:  New Associated symptoms: no headaches     Past Medical History  Diagnosis Date  . Mass of left side of neck 04/2012  . 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 Hodgkin's disease   . Hodgkin lymphoma   . Diastolic dysfunction     Grade 2  . Acute respiratory failure with hypoxia 09/29/2012  . Pulmonary emboli 09/29/2012    Past Surgical History  Procedure Laterality Date  . Eye surgery  as a child  . Mass biopsy  05/05/2012    Procedure: NECK MASS BIOPSY;  Surgeon: [name], MD;  Location: [facility];  Service: ENT;  Laterality: Left;  Excisional biopsy of left neck mass   . Portacath  placement  06/03/2012    Procedure: INSERTION PORT-A-CATH;  Surgeon: [name], MD;  Location: [facility];  Service: General;  Laterality: Left;  Insertion of Port-A-Cath Left Subclavian  . Bone marrow biopsy    . Bone marrow aspiration      No family history on file.  History  Substance Use Topics  . Smoking status: Former Smoker -- 0.10 packs/day for 10 years    Types: Cigarettes    Quit date: 02/26/2012  . Smokeless tobacco: Never Used  . Alcohol Use: No      Review of Systems  Constitutional: Positive for fever. Negative for fatigue.  HENT: Positive for rhinorrhea and sinus pressure. Negative for sore throat and ear discharge.   Eyes: Negative for discharge.  Respiratory: Positive for cough.   Cardiovascular: Negative for chest pain.  Gastrointestinal: Negative for abdominal pain and diarrhea.  Genitourinary: Negative for frequency and hematuria.  Musculoskeletal: Negative for back pain.  Skin: Negative for rash.  Neurological: Negative for seizures and headaches.  Psychiatric/Behavioral: Negative for hallucinations.  All other systems reviewed and are negative.    Allergies  Bleomycin and Amoxicillin  Home Medications   Current Outpatient Rx  Name  Route  Sig  Dispense  Refill  . dacarbazine (DTIC) 200 MG chemo injection      Per [facility]         . DOXOrubicin (  ADRIAMYCIN) 50 MG chemo injection      Per [facility]         . enoxaparin (LOVENOX) 150 MG/ML injection   Subcutaneous   Inject 0.93 mLs (140 mg total) into the skin daily.   30 Syringe   6   . etoposide (VEPESID) 20 MG/ML injection      Per [facility]         . HYDROcodone-acetaminophen (NORCO/VICODIN) 5-325 MG per tablet   Oral   Take 1-2 tablets by mouth every 4 (four) hours as needed.   30 tablet   0   . Multiple Vitamin (MULTIVITAMIN) tablet   Oral   Take 1 tablet by mouth daily.         . naproxen sodium (ALEVE) 220 MG tablet      Per bottle          . ondansetron (ZOFRAN) 8 MG tablet   Oral   Take 8 mg by mouth. Starting the day after chemo, take 1 tablet in the am and 1 tablet in the pm for 3 days. Then may take 1 tablet two times a day IF needed for nausea/vomiting. Side Effect: constipation         . pegfilgrastim (NEULASTA) 6 MG/0.6ML injection      Per [facility]         . predniSONE (DELTASONE) 20 MG tablet      Take 10 mg daily (after completing 2 weeks worth of 20 mg) until seen by pulmonologist   10 tablet   0   . Ranitidine HCl (ZANTAC PO)   Oral   Take 1 capsule by mouth as needed.         . simethicone (MYLICON) 80 MG chewable tablet   Oral   Chew 80 mg by mouth every 6 (six) hours as needed. For gas         . vinBLAStine (VELBAN) 10 MG injection      Per [facility]         . warfarin (COUMADIN) 5 MG tablet   Oral   Take 1 tablet (5 mg total) by mouth daily.   30 tablet   0     Triage Vitals: BP 112/64  Pulse 161  Temp(Src) 100.7 F (38.2 C) (Oral)  Resp 24  Ht 5\' 9"  (1.753 m)  Wt 215 lb (97.523 kg)  BMI 31.74 kg/m2  SpO2 93%  Physical Exam  Nursing note and vitals reviewed. Constitutional: He is oriented to person, place, and time. He appears well-developed.  HENT:  Head: Normocephalic and atraumatic.  Eyes: Conjunctivae and EOM are normal. No scleral icterus.  Neck: Neck supple. No thyromegaly present.  Cardiovascular: Regular rhythm and normal heart sounds.  Tachycardia present.  Exam reveals no gallop and no friction rub.   No murmur heard. Pulmonary/Chest: Effort normal and breath sounds normal. No stridor. No respiratory distress. He has no wheezes. He has no rales. He exhibits no tenderness.  Abdominal: He exhibits no distension. There is no tenderness. There is no rebound.  Musculoskeletal: Normal range of motion. He exhibits no edema.  Lymphadenopathy:    He has no cervical adenopathy.  Neurological: He is oriented to person, place, and time. Coordination  normal.  Skin: No rash noted. No erythema.  Psychiatric: He has a normal mood and affect. His behavior is normal.    ED Course  Procedures (including critical care time)  DIAGNOSTIC STUDIES: Oxygen Saturation is 93%  on RA, adequate by my interpretation.    COORDINATION OF CARE:  3:45 PM-Discussed planned course of treatment with the patient including IV fluids, a chest x-ray, basic blood work and UA, who is agreeable at this time.   5:59 PM-Recheck: Informed pt of imaging and lab results. Will consult with Dr. Mariel Sleet. Pt agrees with plan.   6:00-Medication Orders: Acetaminophen (Tylenol) tablet 1,000 mg-once  6:10 PM-Consult complete with Dr. Mariel Sleet, oncology. Patient case explained and discussed. Dr. Mariel Sleet advised that pt should be admitted and will see in the morning. Will call hospitalist. Call ended at 6:13 PM.  6:15 PM-Informed pt of consultation with Dr. Mariel Sleet and admission. He is agreeable with plan.   6:30 PM-Medication Orders: Ceftazidime Elita Quick) 2 g in dextrose 5% 50 mL IVPB-once.   6:35 PM-Consult complete with hospitalist. Patient case explained and discussed. Admitting hospitalist agrees to admit patient for further evaluation and treatment. Call ended at 6:38 PM.    Results for orders placed during the hospital encounter of 11/15/12  CULTURE, BLOOD (ROUTINE X 2)      Result Value Range   Specimen Description LEFT ANTECUBITAL     Special Requests BOTTLES DRAWN AEROBIC AND ANAEROBIC 6CC     Culture PENDING     Report Status PENDING    CULTURE, BLOOD (ROUTINE X 2)      Result Value Range   Specimen Description RIGHT ANTECUBITAL     Special Requests BOTTLES DRAWN AEROBIC AND ANAEROBIC 6CC     Culture PENDING     Report Status PENDING    CBC WITH DIFFERENTIAL      Result Value Range   WBC 14.9 (*) 4.0 - 10.5 K/uL   RBC 3.98 (*) 4.22 - 5.81 MIL/uL   Hemoglobin 11.8 (*) 13.0 - 17.0 g/dL   HCT 16.1 (*) 09.6 - 04.5 %   MCV 89.2  78.0 - 100.0 fL    MCH 29.6  26.0 - 34.0 pg   MCHC 33.2  30.0 - 36.0 g/dL   RDW 40.9 (*) 81.1 - 91.4 %   Platelets 140 (*) 150 - 400 K/uL   Neutrophils Relative 77  43 - 77 %   Neutro Abs 11.4 (*) 1.7 - 7.7 K/uL   Lymphocytes Relative 11 (*) 12 - 46 %   Lymphs Abs 1.7  0.7 - 4.0 K/uL   Monocytes Relative 11  3 - 12 %   Monocytes Absolute 1.8 (*) 0.1 - 1.0 K/uL   Eosinophils Relative 0  0 - 5 %   Eosinophils Absolute 0.0  0.0 - 0.7 K/uL   Basophils Relative 0  0 - 1 %   Basophils Absolute 0.0  0.0 - 0.1 K/uL   WBC Morphology INCREASED BANDS (>20% BANDS)     RBC Morphology POLYCHROMASIA PRESENT     Smear Review PERFORMED    COMPREHENSIVE METABOLIC PANEL      Result Value Range   Sodium 135  135 - 145 mEq/L   Potassium 3.8  3.5 - 5.1 mEq/L   Chloride 97  96 - 112 mEq/L   CO2 26  19 - 32 mEq/L   Glucose, Bld 110 (*) 70 - 99 mg/dL   BUN 10  6 - 23 mg/dL   Creatinine, Ser 7.82  0.50 - 1.35 mg/dL   Calcium 9.3  8.4 - 95.6 mg/dL   Total Protein 6.5  6.0 - 8.3 g/dL   Albumin 3.8  3.5 - 5.2 g/dL   AST 21  0 -  37 U/L   ALT 42  0 - 53 U/L   Alkaline Phosphatase 90  39 - 117 U/L   Total Bilirubin 0.6  0.3 - 1.2 mg/dL   GFR calc non Af Amer >90  >90 mL/min   GFR calc Af Amer >90  >90 mL/min  LACTIC ACID, PLASMA      Result Value Range   Lactic Acid, Venous 1.6  0.5 - 2.2 mmol/L  URINALYSIS, ROUTINE W REFLEX MICROSCOPIC      Result Value Range   Color, Urine YELLOW  YELLOW   APPearance CLEAR  CLEAR   Specific Gravity, Urine <1.005 (*) 1.005 - 1.030   pH 7.0  5.0 - 8.0   Glucose, UA NEGATIVE  NEGATIVE mg/dL   Hgb urine dipstick TRACE (*) NEGATIVE   Bilirubin Urine NEGATIVE  NEGATIVE   Ketones, ur NEGATIVE  NEGATIVE mg/dL   Protein, ur NEGATIVE  NEGATIVE mg/dL   Urobilinogen, UA 0.2  0.0 - 1.0 mg/dL   Nitrite NEGATIVE  NEGATIVE   Leukocytes, UA NEGATIVE  NEGATIVE  PROTIME-INR      Result Value Range   Prothrombin Time 22.3 (*) 11.6 - 15.2 seconds   INR 2.05 (*) 0.00 - 1.49  URINE MICROSCOPIC-ADD  ON      Result Value Range   WBC, UA 3-6  <3 WBC/hpf   RBC / HPF 3-6  <3 RBC/hpf    Dg Chest 2 View  11/15/2012  *RADIOLOGY REPORT*  Clinical Data: Fever  CHEST - 2 VIEW  Comparison: 10/22/2012  Findings: Cardiomediastinal silhouette is stable.  Streaky right infrahilar atelectasis or early infiltrate.  No pulmonary edema. Stable left subclavian Port-A-Cath position.  IMPRESSION: Streaky right infrahilar atelectasis or early infiltrate.  No pulmonary edema.   Original Report Authenticated By: Natasha Mead, M.D.      No diagnosis found.   Date: 11/15/2012  Rate:132  Rhythm: sinus tachycardia  QRS Axis: normal  Intervals: normal  ST/T Wave abnormalities: normal  Conduction Disutrbances:none  Narrative Interpretation:   Old EKG Reviewed: changes noted    MDM      The chart was scribed for me under my direct supervision.  I personally performed the history, physical, and medical decision making and all procedures in the evaluation of this patient.[name], MD 11/15/12 [phone]

## 2012-11-15 NOTE — ED Notes (Signed)
Called 3 rd floor to let them know that pt is ready to come up as soon as they finish getting report.

## 2012-11-15 NOTE — ED Notes (Signed)
Dr Estell Harpin made aware of fever and administration of tylenol

## 2012-11-15 NOTE — H&P (Signed)
[facility] History and Physical  [name]  ZOX:[id number]  DOB: 11-24-1975   DOA: 11/15/2012   PCP:   [name], MD   Chief Complaint:   Runny nose for 2 days Fever since today  HPI: Samuel [name] is an 37 y.o. male.   Young Caucasian gentleman diagnosed with non-Hodgkin's lymphoma in October 2013, currently receiving chemotherapy every 2 weeks, last chemotherapy about 12 days ago, and he received Neulasta in the following day. His son has been sick with an upper respiratory infection and patient also developed the symptoms 2 days ago today spiked a high fever and has been feeling particularly sick so he came to the emergency room to be evaluated.  In the emergency room his white count was noted to be elevated and also left shifted, the oncology service was contacted and they recommended admission for evaluation and intravenous antibiotics.  Does have a sinus pressure but denies sore throat, cough or chest pain.   He started bleomycin in February because of pulmonary complications, and is currently receiving prednisone from his pulmonologist. His current dose is 20 mg per day soon to be reduced to 10 mg.  He is on warfarin for recent diagnosis of pulmonary embolism.  Rewiew of Systems:   All systems negative except as marked bold or noted in the HPI;  Constitutional:    malaise, fever and chills. ;  Eyes:   eye pain, redness and discharge. ;  ENMT:   ear pain, hoarseness, nasal congestion, sinus pressure and sore throat. ;  Cardiovascular:    chest pain, palpitations, diaphoresis, dyspnea and peripheral edema.  Respiratory:   cough, hemoptysis, wheezing and stridor. ;  Gastrointestinal:  nausea, vomiting, diarrhea, constipation, abdominal pain, melena, blood in stool, hematemesis, jaundice and rectal bleeding. unusual weight loss..   Genitourinary:    frequency, dysuria, incontinence,flank pain and hematuria; Musculoskeletal:   back pain and neck pain.  swelling and  trauma.;  Skin: .  pruritus, rash, abrasions, bruising and skin lesion.; ulcerations Neuro:    headache, lightheadedness and neck stiffness.  weakness, altered level of consciousness, altered mental status, extremity weakness, burning feet, involuntary movement, seizure and syncope.  Psych:    anxiety, depression, insomnia, tearfulness, panic attacks, hallucinations, paranoia, suicidal or homicidal ideation    Past Medical History  Diagnosis Date  . Mass of left side of neck 04/2012  . 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 Hodgkin's disease   . Hodgkin lymphoma   . Diastolic dysfunction     Grade 2  . Acute respiratory failure with hypoxia 09/29/2012  . Pulmonary emboli 09/29/2012    Past Surgical History  Procedure Laterality Date  . Eye surgery  as a child  . Mass biopsy  05/05/2012    Procedure: NECK MASS BIOPSY;  Surgeon: [name], MD;  Location: [facility];  Service: ENT;  Laterality: Left;  Excisional biopsy of left neck mass   . Portacath placement  06/03/2012    Procedure: INSERTION PORT-A-CATH;  Surgeon: [name], MD;  Location: [facility];  Service: General;  Laterality: Left;  Insertion of Port-A-Cath Left Subclavian  . Bone marrow biopsy    . Bone marrow aspiration      Medications:  HOME MEDS: Prior to Admission medications   Medication Sig Start Date End Date Taking? Authorizing Provider  dacarbazine (DTIC) 200 MG chemo injection Per [facility]   Yes Historical Provider,  MD  DOXOrubicin (ADRIAMYCIN) 50 MG chemo injection Per [facility]   Yes Historical Provider, MD  etoposide (VEPESID) 20 MG/ML injection Per [facility]   Yes Historical Provider, MD  HYDROcodone-acetaminophen (NORCO/VICODIN) 5-325 MG per tablet Take 1-2 tablets by mouth every 4 (four) hours as needed. 10/06/12  Yes Simonne Martinet, NP  naproxen sodium (ALEVE) 220 MG tablet Take 220 mg by mouth daily as needed (for pain).    Yes  Historical Provider, MD  ondansetron (ZOFRAN) 8 MG tablet Take 8 mg by mouth. Starting the day after chemo, take 1 tablet in the am and 1 tablet in the pm for 3 days. Then may take 1 tablet two times a day IF needed for nausea/vomiting. Side Effect: constipation   Yes Historical Provider, MD  pegfilgrastim (NEULASTA) 6 MG/0.6ML injection Inject 6 mg into the skin every 14 (fourteen) days. Per [facility]   Yes Historical Provider, MD  predniSONE (DELTASONE) 20 MG tablet Take 10 mg by mouth daily. Take 10 mg daily (after completing 2 weeks worth of 20 mg) until seen by pulmonologist 11/03/12  Yes Ellouise Newer, PA-C  ranitidine (ZANTAC) 150 MG tablet Take 150 mg by mouth daily as needed for heartburn.   Yes Historical Provider, MD  simethicone (MYLICON) 80 MG chewable tablet Chew 80 mg by mouth every 6 (six) hours as needed. For gas   Yes Historical Provider, MD  vinBLAStine (VELBAN) 10 MG injection Per [facility]   Yes Historical Provider, MD  warfarin (COUMADIN) 5 MG tablet Take 5-7.5 mg by mouth daily. *Taken at 5pm daily*. Patient is to alternate taking 7.5mg  for 3 days and taking 5mg  on all other days. 11/06/12  Yes Randall An, MD     Allergies:  Allergies  Allergen Reactions  . Bleomycin Other (See Comments)    Pt no longer receiving bleomycin d/t potential lung toxicity. Pt started Bleomycin on 06/09/12 and last dose was 09/14/12.  Marland Kitchen Amoxicillin Rash    Social History:   reports that he quit smoking about 8 months ago. His smoking use included Cigarettes. He has a 1 pack-year smoking history. He has never used smokeless tobacco. He reports that he does not drink alcohol or use illicit drugs.  Family History: No family history on file.   Physical Exam: Filed Vitals:   11/15/12 1748 11/15/12 1900 11/15/12 1910 11/15/12 2030  BP: 114/68 109/61  115/68  Pulse: 120 124  101  Temp: 102.6 F (39.2 C)  100.9 F (38.3 C) 99 F (37.2 C)  TempSrc: Oral   Oral  Resp: 20  19  20   Height:    5\' 9"  (1.753 m)  Weight:    93.9 kg (207 lb 0.2 oz)  SpO2: 94% 93%  92%   Blood pressure 115/68, pulse 101, temperature 99 F (37.2 C), temperature source Oral, resp. rate 20, height 5\' 9"  (1.753 m), weight 93.9 kg (207 lb 0.2 oz), SpO2 92.00%.  GEN:  Pleasant Caucasian gentleman lying bed in no acute distress; cooperative with exam PSYCH:  alert and oriented x4; neither anxious or depressed; affect is appropriate. HEENT: Mucous membranes pink and anicteric; PERRLA; EOM intact; no cervical lymphadenopathy nor thyromegaly or carotid bruit; no JVD; his throat is not inflamed; has no oral Candida Breasts:: Not examined CHEST WALL: No tenderness; Port-A-Cath left upper chest CHEST: Normal respiration, clear to auscultation bilaterally HEART: Regular rate and rhythm; no murmurs rubs or gallops BACK: No kyphosis no scoliosis;  no CVA tenderness ABDOMEN: Obese, soft non-tender; no masses, no organomegaly, normal abdominal bowel sounds; no pannus; no intertriginous candida. Rectal Exam: Not done EXTREMITIES: No bone or joint deformity; ; no edema; no ulcerations. Genitalia: not examined PULSES: 2+ and symmetric SKIN: Normal hydration no rash or ulceration CNS: Cranial nerves 2-12 grossly intact no focal lateralizing neurologic deficit   Labs on Admission:  Basic Metabolic Panel:  Recent Labs Lab 11/09/12 0912 11/15/12 1552  NA 136 135  K 4.1 3.8  CL 99 97  CO2 25 26  GLUCOSE 87 110*  BUN 15 10  CREATININE 0.86 1.03  CALCIUM 9.6 9.3   Liver Function Tests:  Recent Labs Lab 11/09/12 0912 11/15/12 1552  AST 16 21  ALT 45 42  ALKPHOS 107 90  BILITOT 0.4 0.6  PROT 6.5 6.5  ALBUMIN 3.9 3.8   No results found for this basename: LIPASE, AMYLASE,  in the last 168 hours No results found for this basename: AMMONIA,  in the last 168 hours CBC:  Recent Labs Lab 11/09/12 0912 11/15/12 1552  WBC 5.6 14.9*  NEUTROABS 3.4 11.4*  HGB 12.8* 11.8*  HCT 39.0 35.5*   MCV 89.7 89.2  PLT 186 140*   Cardiac Enzymes: No results found for this basename: CKTOTAL, CKMB, CKMBINDEX, TROPONINI,  in the last 168 hours BNP: No components found with this basename: POCBNP,  D-dimer: No components found with this basename: D-DIMER,  CBG: No results found for this basename: GLUCAP,  in the last 168 hours  Radiological Exams on Admission: Dg Chest 2 View  11/15/2012  *RADIOLOGY REPORT*  Clinical Data: Fever  CHEST - 2 VIEW  Comparison: 10/22/2012  Findings: Cardiomediastinal silhouette is stable.  Streaky right infrahilar atelectasis or early infiltrate.  No pulmonary edema. Stable left subclavian Port-A-Cath position.  IMPRESSION: Streaky right infrahilar atelectasis or early infiltrate.  No pulmonary edema.   Original Report Authenticated By: Natasha Mead, M.D.      Assessment/Plan Present on Admission:  . Acute upper respiratory infections of unspecified site  This is possibly an early pneumonia, but his symptoms are related to the upper respiratory tract, he does have right lower lung suggestive infiltrates on his chest x-ray but it is unclear if this represents clearing of neomycin related pulmonary disease, or lung changes related to his recent right lower lobe pulmonary embolus.   . Thrombocytopenia . Pulmonary emboli . Hodgkin's disease . Anemia . Fever   PLAN: Continue Elita Quick as recommended by oncology service; his bandemia is concerning and will consider adding vancomycin because of his Port-A-Cath; blood culture results are pending.  Will give IV fluid hydration while still having fever.  Continue management of his other chronic medical conditions  Other plans as per orders.  Code Status: FULL CODE  Family Communication: Discussed with patient and wife at bedside Disposition Plan: Per oncology    Caedin Mogan Nocturnist [facility] Pager [phone]  11/15/2012, 10:06 PM

## 2012-11-15 NOTE — ED Notes (Signed)
Pt c/o fever, cough, and congestion that began this morning. Pt denies any pain, NVD, SOB. Pt states cough is non-productive. Pt is currently receiving chemo tx and his next appointment is Tuesday.

## 2012-11-15 NOTE — Progress Notes (Signed)
ANTIBIOTIC CONSULT NOTE - INITIAL  Pharmacy Consult for [facility] Indication: Fever Cancer Patient  Allergies  Allergen Reactions  . Bleomycin Other (See Comments)    Pt no longer receiving bleomycin d/t potential lung toxicity. Pt started Bleomycin on 06/09/12 and last dose was 09/14/12.  Marland Kitchen Amoxicillin Rash    Patient Measurements: Height: 5\' 9"  (175.3 cm) Weight: 207 lb 0.2 oz (93.9 kg) IBW/kg (Calculated) : 70.7  Vital Signs: Temp: 99 F (37.2 C) (03/23 2030) Temp src: Oral (03/23 2030) BP: 115/68 mmHg (03/23 2030) Pulse Rate: 101 (03/23 2030) Intake/Output from previous day:   Intake/Output from this shift:    Labs:  Recent Labs  11/15/12 1552  WBC 14.9*  HGB 11.8*  PLT 140*  CREATININE 1.03   Estimated Creatinine Clearance: 112.2 ml/min (by C-G formula based on Cr of 1.03). No results found for this basename: VANCOTROUGH, Leodis Binet, VANCORANDOM, GENTTROUGH, GENTPEAK, GENTRANDOM, TOBRATROUGH, TOBRAPEAK, TOBRARND, AMIKACINPEAK, AMIKACINTROU, AMIKACIN,  in the last 72 hours   Microbiology: Recent Results (from the past 720 hour(s))  CULTURE, BLOOD (ROUTINE X 2)     Status: None   Collection Time    11/15/12  3:54 PM      Result Value Range Status   Specimen Description LEFT ANTECUBITAL   Final   Special Requests BOTTLES DRAWN AEROBIC AND ANAEROBIC 6CC   Final   Culture PENDING   Incomplete   Report Status PENDING   Incomplete  CULTURE, BLOOD (ROUTINE X 2)     Status: None   Collection Time    11/15/12  3:59 PM      Result Value Range Status   Specimen Description RIGHT ANTECUBITAL   Final   Special Requests BOTTLES DRAWN AEROBIC AND ANAEROBIC 6CC   Final   Culture PENDING   Incomplete   Report Status PENDING   Incomplete    Medical History: Past Medical History  Diagnosis Date  . Mass of left side of neck 04/2012  . 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 Hodgkin's disease   . Hodgkin lymphoma   . Diastolic dysfunction      Grade 2  . Acute respiratory failure with hypoxia 09/29/2012  . Pulmonary emboli 09/29/2012    Medications:  Scheduled:  . sodium chloride   Intravenous STAT  . [COMPLETED] acetaminophen  1,000 mg Oral Once  . [COMPLETED] ceftAZIDime (FORTAZ) IVPB 2 gram/50 mL D5W (Pyxis)  2 g Intravenous Once  . [START ON 11/16/2012] cefTAZidime (FORTAZ)  IV  2 g Intravenous Q8H  . guaiFENesin  1,200 mg Oral BID  . pantoprazole  40 mg Oral Daily  . [START ON 11/16/2012] predniSONE  20 mg Oral BID WC  . [COMPLETED] sodium chloride  1,000 mL Intravenous Once  . warfarin  5 mg Oral Once  . [START ON 11/16/2012] Warfarin - Pharmacist Dosing Inpatient   Does not apply Q24H   Assessment: Fortaz 2 GM IV given in ER Amoxicillin allergy (rash) noted No adverse reaction noted to Fortaz dose in ER Excellent renal function  Goal of Therapy:  Eradicate infection  Plan:  Fortaz 2 GM IV every 8 hours Monitor patient for adverse reaction due to Amoxicillin allergy (rash) Monitor renal function Labs per protocol   Raquel James, Omega Durante Bennett 11/15/2012,10:23 PM

## 2012-11-16 ENCOUNTER — Encounter (HOSPITAL_COMMUNITY): Payer: Self-pay | Admitting: Internal Medicine

## 2012-11-16 DIAGNOSIS — C8119 Nodular sclerosis classical Hodgkin lymphoma, extranodal and solid organ sites: Secondary | ICD-10-CM

## 2012-11-16 DIAGNOSIS — R918 Other nonspecific abnormal finding of lung field: Secondary | ICD-10-CM

## 2012-11-16 DIAGNOSIS — D696 Thrombocytopenia, unspecified: Secondary | ICD-10-CM

## 2012-11-16 LAB — URINE CULTURE: Culture: NO GROWTH

## 2012-11-16 LAB — CBC
MCH: 30 pg (ref 26.0–34.0)
MCHC: 33.5 g/dL (ref 30.0–36.0)
MCV: 89.5 fL (ref 78.0–100.0)
Platelets: 115 10*3/uL — ABNORMAL LOW (ref 150–400)
RBC: 3.53 MIL/uL — ABNORMAL LOW (ref 4.22–5.81)
RDW: 18.9 % — ABNORMAL HIGH (ref 11.5–15.5)

## 2012-11-16 LAB — BASIC METABOLIC PANEL
CO2: 25 mEq/L (ref 19–32)
Calcium: 8.5 mg/dL (ref 8.4–10.5)
Creatinine, Ser: 0.97 mg/dL (ref 0.50–1.35)
GFR calc non Af Amer: >90 mL/min (ref >90)
Glucose, Bld: 94 mg/dL (ref 70–99)
Sodium: 138 mEq/L (ref 135–145)

## 2012-11-16 MED ORDER — WARFARIN SODIUM 5 MG PO TABS
5.0000 mg | ORAL_TABLET | Freq: Once | ORAL | Status: AC
  Administered 2012-11-16: 5 mg via ORAL
  Filled 2012-11-16: qty 1

## 2012-11-16 NOTE — Care Management Note (Signed)
    Page 1 of 1   11/17/2012     2:32:40 PM   CARE MANAGEMENT NOTE 11/17/2012  Patient:  Samuel [name],Samuel [name]   Account Number:  [id number]  Date Initiated:  11/16/2012  Documentation initiated by:  Rosemary Holms  Subjective/Objective Assessment:   Pt admitted from home where he lives with his spouse. No HH needs identified or anticipated. PCP [place].     Action/Plan:   Anticipated DC Date:  11/17/2012   Anticipated DC Plan:  HOME/SELF CARE      DC Planning Services  CM consult      Choice offered to / List presented to:             Status of service:  Completed, signed off Medicare Important Message given?   (If response is "NO", the following Medicare IM given date fields will be blank) Date Medicare IM given:   Date Additional Medicare IM given:    Discharge Disposition:  HOME/SELF CARE  Per UR Regulation:    If discussed at Long Length of Stay Meetings, dates discussed:    Comments:  11/16/12 Rosemary Holms RN BSN CM

## 2012-11-16 NOTE — Progress Notes (Signed)
Pt seen and examined. Chart reviewed.   Spoke to Dr. Mariel Sleet this am. He recommends 24 more hours IV antibiotics.   Exam: Gen: well nourished. Non-toxic appearing. Smiling CV : RRR No MGR No LEE PPP bilaterally Resp: normal effort. BS with good air flow. Fine crackles bases. No wheeze no rhonchi Abd: soft +BS non-tender to palpation  Assessment/plan  1. Upper respiratory infection undefined in patient with hodgkin lymphoma. WC 12.  Chest xray suggestive of right lower lung infiltrates but this could be residual from last months illness. Max temp 100.5 today. Non-toxic appearing.  Continue Samuel [name].  Monitor closely  2. Hodgkin's disease: Dr. Mariel Sleet consulting and appreciate input. Will reschedule chemo due tomorrow per Dr. Mariel Sleet.   3. Thrombocytopenia: related to #2. Trending down. Currently 115. No s/sx unusual bleeding. Monitor. Heme/onc on board  4. Anemia: currently 10. 6. Fairly close to baseline. Related to #2. Heme/onc following.   5. Hx recent PE: on coumadin. Pharmacy dosing. INR 2.37  Samuel [name]. Kron Everton NP

## 2012-11-16 NOTE — Progress Notes (Signed)
Patient seen. Independently examined and chart reviewed. I agree with exam, assessment and plan discussed with Toya Smothers, NP.  Subjective: Feels much better. No real complaints today. He like to go home.  Objective: Appears calm and comfortable. Well-appearing. Cardiovascular regular rate and rhythm. No murmur, rub, gallop. Respiratory clear to auscultation bilaterally no wheezes, rales, rhonchi. Normal respiratory effort.  Labs: White cell blood count at 12.1. Cultures pending.  Acute issues:  Fever  Upper respiratory infection. Pneumonia doubt it. Pulmonary embolism diagnosed prior to admission over the on warfarin  Plan:  Continue antibiotics per oncology. Anticipate discharge 30/25  Summary: 37 year old man currently undergoing chemotherapy for Hodgkin's lymphoma presented to the emergency department with upper respiratory symptoms and fever up to 101.8 at home. His son is also sick at home with upper respiratory symptoms. He was admitted for upper respiratory infection and in consultation with oncology started on Fortaz. Chest x-ray was equivocal and no definite bacterial infection was identified. He was hospitalized in February of this year for acute respiratory failure. Chest x-ray findings may represent clearing of chemotherapy related pulmonary disease or lung changes from recent pulmonary embolism. He continues on warfarin  Brendia Sacks, MD [facility] [phone]

## 2012-11-16 NOTE — Progress Notes (Signed)
UR Chart Review Completed  

## 2012-11-16 NOTE — Progress Notes (Signed)
ANTICOAGULATION CONSULT NOTE   Pharmacy Consult for Warfarin Indication: VTE Treatment  Allergies  Allergen Reactions  . Bleomycin Other (See Comments)    Pt no longer receiving bleomycin d/t potential lung toxicity. Pt started Bleomycin on 06/09/12 and last dose was 09/14/12.  Marland Kitchen Amoxicillin Rash   Patient Measurements: Height: 5\' 9"  (175.3 cm) Weight: 208 lb 1.6 oz (94.394 kg) IBW/kg (Calculated) : 70.7  Vital Signs: Temp: 98.3 F (36.8 C) (03/24 0502) Temp src: Oral (03/24 0502) BP: 120/64 mmHg (03/24 0502) Pulse Rate: 87 (03/24 0502)  Labs:  Recent Labs  11/15/12 1552 11/16/12 0516  HGB 11.8* 10.6*  HCT 35.5* 31.6*  PLT 140* 115*  LABPROT 22.3* 24.8*  INR 2.05* 2.37*  CREATININE 1.03 0.97   Estimated Creatinine Clearance: 119.4 ml/min (by C-G formula based on Cr of 0.97).  Medical History: Past Medical History  Diagnosis Date  . Mass of left side of neck 04/2012  . 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 Hodgkin's disease   . Hodgkin lymphoma   . Diastolic dysfunction     Grade 2  . Acute respiratory failure with hypoxia 09/29/2012  . Pulmonary emboli 09/29/2012   Medications:  Scheduled:  . [COMPLETED] sodium chloride   Intravenous STAT  . [COMPLETED] acetaminophen  1,000 mg Oral Once  . [COMPLETED] ceftAZIDime (FORTAZ) IVPB 2 gram/50 mL D5W (Pyxis)  2 g Intravenous Once  . cefTAZidime (FORTAZ)  IV  2 g Intravenous Q8H  . guaiFENesin  1,200 mg Oral BID  . pantoprazole  40 mg Oral Daily  . predniSONE  20 mg Oral BID WC  . [COMPLETED] sodium chloride  1,000 mL Intravenous Once  . warfarin  5 mg Oral Once  . Warfarin - Pharmacist Dosing Inpatient   Does not apply Q24H   Assessment: Continuation of Warfarin from home PTA Coumadin 7.5 mg three times a week, 5 mg the other days INR therapeutic  Goal of Therapy:  INR 2-3 Monitor platelets by anticoagulation protocol: Yes   Plan: Coumadin 5mg  today x 1 (home  regimen) INR/PT daily Labs per protocol  Valrie Hart A 11/16/2012,10:59 AM

## 2012-11-16 NOTE — Progress Notes (Signed)
Very pleasant young gentleman with classic nodular sclerosing Hodgkin's disease who was admitted last night with fever, tachycardia, mild abnormal chest x-ray on the right perihilar and lower lobe area. He is clearly doing much better today with no temperature, normal respirations. He looks slightly chronically ill. He is not in acute distress.  His neck shows no lymphadenopathy. His lungs show a few crackles at both bases which do not clear with coughing otherwise his lungs are very clear. There no wheezes and no rubs.  He has no leg edema.  His lab work is still quite good but I would like him to continue antibiotics for 24 hours before sending him home. Thus far cultures are negative and his urine was unremarkable. He is wife are agreeable to this plan.

## 2012-11-17 ENCOUNTER — Other Ambulatory Visit (HOSPITAL_COMMUNITY): Payer: Self-pay | Admitting: Oncology

## 2012-11-17 ENCOUNTER — Encounter (HOSPITAL_COMMUNITY): Payer: Self-pay | Admitting: Oncology

## 2012-11-17 ENCOUNTER — Ambulatory Visit (HOSPITAL_COMMUNITY): Payer: PRIVATE HEALTH INSURANCE

## 2012-11-17 DIAGNOSIS — D649 Anemia, unspecified: Secondary | ICD-10-CM

## 2012-11-17 LAB — CBC
Hemoglobin: 10.6 g/dL — ABNORMAL LOW (ref 13.0–17.0)
MCHC: 33.1 g/dL (ref 30.0–36.0)
RBC: 3.6 MIL/uL — ABNORMAL LOW (ref 4.22–5.81)
WBC: 8.8 10*3/uL (ref 4.0–10.5)

## 2012-11-17 LAB — PROTIME-INR
INR: 2.35 — ABNORMAL HIGH (ref 0.00–1.49)
INR: 2.5 — AB (ref 0.9–1.1)
Prothrombin Time: 24.7 seconds — ABNORMAL HIGH (ref 11.6–15.2)

## 2012-11-17 MED ORDER — GUAIFENESIN ER 600 MG PO TB12: 1200.0000 mg | ORAL_TABLET | Freq: Two times a day (BID) | ORAL | Status: DC

## 2012-11-17 MED ORDER — WARFARIN SODIUM 5 MG PO TABS: 5.0000 mg | ORAL_TABLET | Freq: Once | ORAL | Status: DC

## 2012-11-17 MED ORDER — HEPARIN SOD (PORK) LOCK FLUSH 100 UNIT/ML IV SOLN
500.0000 [IU] | INTRAVENOUS | Status: DC | PRN
  Filled 2012-11-17: qty 5

## 2012-11-17 MED ORDER — HEPARIN SOD (PORK) LOCK FLUSH 100 UNIT/ML IV SOLN
500.0000 [IU] | INTRAVENOUS | Status: AC | PRN
  Administered 2012-11-17: 500 [IU]

## 2012-11-17 MED ORDER — MOXIFLOXACIN HCL 400 MG PO TABS: 400.0000 mg | ORAL_TABLET | Freq: Every day | ORAL | Status: DC

## 2012-11-17 MED ORDER — PREDNISONE 20 MG PO TABS: ORAL_TABLET | ORAL | Status: DC

## 2012-11-17 MED ORDER — LEVOFLOXACIN 750 MG PO TABS: 750.0000 mg | ORAL_TABLET | Freq: Every day | ORAL | Status: DC

## 2012-11-17 NOTE — Discharge Summary (Signed)
Patient seen. Independently examined and chart reviewed. I agree with exam, assessment, plan and discharge discussed with Toya Smothers, NP.   Subjective: Feels fine. No complaints. Rate go home.   Objective: Afebrile, vital signs stable. Appears calm and comfortable. Well-appearing. Cardiovascular regular rate and rhythm. No murmur, rub, gallop. Respiratory clear to auscultation bilaterally no wheezes, rales, rhonchi. Normal respiratory effort.   Labs: White cell blood count 8.8. Urine culture no growth, final. Blood cultures pending, no growth to date.  Acute issues:  Fever  Upper respiratory infection. Pneumonia doubted.  Pulmonary embolism diagnosed prior to admission over the on warfarin Plan:  Changed to Avelox per oncology. Outpatient pulmonary followup. Discharge home.  Brendia Sacks, MD [facility] [phone]

## 2012-11-17 NOTE — Progress Notes (Signed)
Subjective: Patient seen in bed with mother at the bedside.   He denies any complaints.   I personally reviewed and went over laboratory results with the patient.  Patient feels good and denies any complaints.    Plan for discharge today  Will defer chemotherapy x 7 days.   Objective: Vital signs in last 24 hours: Temp:  [97.6 F (36.4 C)-98.2 F (36.8 C)] 98.2 F (36.8 C) (03/25 0933) Pulse Rate:  [70-93] 71 (03/25 0933) Resp:  [18-20] 20 (03/25 0933) BP: (111-130)/(57-78) 130/78 mmHg (03/25 0933) SpO2:  [94 %-99 %] 99 % (03/25 0933) Weight:  [208 lb 1.6 oz (94.394 kg)] 208 lb 1.6 oz (94.394 kg) (03/25 0700)  Intake/Output from previous day: 03/24 0800 - 03/25 0759 In: 1370 [P.O.:480; I.V.:740; IV Piggyback:150] Out: 300 [Urine:300] Intake/Output this shift:    General appearance: NAD, looking well with mother at bedside, comfortable and pleasant  Lab Results:   Recent Labs  11/16/12 0516 11/17/12 0435  WBC 12.1* 8.8  HGB 10.6* 10.6*  HCT 31.6* 32.0*  PLT 115* 134*   BMET  Recent Labs  11/15/12 1552 11/16/12 0516  NA 135 138  K 3.8 3.9  CL 97 103  CO2 26 25  GLUCOSE 110* 94  BUN 10 11  CREATININE 1.03 0.97  CALCIUM 9.3 8.5    Studies/Results: Dg Chest 2 View  11/15/2012  *RADIOLOGY REPORT*  Clinical Data: Fever  CHEST - 2 VIEW  Comparison: 10/22/2012  Findings: Cardiomediastinal silhouette is stable.  Streaky right infrahilar atelectasis or early infiltrate.  No pulmonary edema. Stable left subclavian Port-A-Cath position.  IMPRESSION: Streaky right infrahilar atelectasis or early infiltrate.  No pulmonary edema.   Original Report Authenticated By: Natasha Mead, M.D.     Medications: I have reviewed the patient's current medications.  Assessment/Plan: 1. Fever of unknown origin, resolved.  On broad spectrum antibiotics.  2. Leukocytosis, resolved 3. Anemia, stable 4. Thrombocytopenia, improving 5. Stage II A. classic nodular sclerosing Hodgkin's  disease status post 3 1/2 cycles of ABVD with Etoposide being substituted for Day 15 of cycle 4 and this substitution will continue for the remaining treatments. He had a one-week delay with cycle 1 due to low blood counts, finally agreed to Neulasta. He did achieve a complete remission by cycle 3. Now day 15 of cycle 5.  Chemotherapy will be deferred x 7 days.   Patient and plan discussed with Dr. Glenford Peers and he is in agreement with the aforementioned.   LOS: 2 days    Jo Booze 11/17/2012

## 2012-11-17 NOTE — Discharge Summary (Signed)
Physician Discharge Summary  Samuel [name] ZOX:[id number] DOB: 1976-05-31 DOA: 11/15/2012  PCP: [name], MD  Admit date: 11/15/2012 Discharge date: 11/17/2012  Time spent: 45 minutes  Recommendations for Outpatient Follow-up:  1. Pt has appointment with Dr. Shan Levans 11/20/12. Will continue prednisone 40mg  daily until then and defer to Dr. Delford Field for recommendations of further taper pace. Of note, before admission pt on 10mg  po daily and had been for 3 weeks.  2. Chemotherapy deferred for 7 days per oncology.  3. INR check daily while on antibiotic. Will follow scheduled lab work with onc center   Discharge Diagnoses:  Principal Problem:   Acute upper respiratory infections of unspecified site Active Problems:   Hodgkin's disease   Thrombocytopenia   Anemia   Pulmonary emboli   Fever   Discharge Condition: stable  Diet recommendation: regular  Filed Weights   11/15/12 2030 11/16/12 0502 11/17/12 0700  Weight: 93.9 kg (207 lb 0.2 oz) 94.394 kg (208 lb 1.6 oz) 94.394 kg (208 lb 1.6 oz)    History of present illness:  Samuel [name] is an 37 y.o. gentleman diagnosed with non-Hodgkin's lymphoma in October 2013, currently receiving chemotherapy every 2 weeks, last chemotherapy about 12 days prior to presentation on 11/15/12. He reported that he received Neulasta in the following day. He stated that his young son had been sick with an upper respiratory infection and patient also developed the symptoms 2 days prior to admission. In addition he reported a high fever and  feeling particularly sick so he went to the emergency room to be evaluated. In the emergency room his white count was noted to be elevated and also left shifted, the oncology service was contacted and they recommended admission for evaluation and intravenous antibiotics. Pt did report having sinus pressure but denied sore throat, cough or chest pain.  He started bleomycin in February because of pulmonary  complications, and is currently receiving prednisone from his pulmonologist. His current dose is 10mg .He is on warfarin for recent diagnosis of pulmonary embolism. [facility] asked to admit.   Hospital Course:  1. Upper respiratory infection undefined in patient with hodgkin lymphoma. WC 12. Pt admitted to floor.  Chest xray suggestive of right lower lung infiltrates but this could be residual from last months illness. fortaz IV started and pt received 6 doses during hospitalization.  White count 14.9 on admission and 8.8 at discharge.  Max temp during hospitalization 102 oral. On day of discharge 97.6 orally.  Pt remained non-toxic appearing. Will be discharged with 7 more days   2. Hodgkin's disease: Dr. Mariel Sleet consulted Chemo due 3/25 deferred 7 days.per Dr. Mariel Sleet.   3. Thrombocytopenia: related to #2. Trending up at discharge. Level 134. No s/sx unusual bleeding.   4. Anemia: Related to #2. Remained stable at baseline  during hospitalization.   5. Hx recent PE: on coumadin. Pharmacy dosed during hospitalization and recommends resuming home regimen at discharge.  INR 2.35a t discharge.        Procedures:  none  Consultations:  Dr. Mariel Sleet Oncology  Discharge Exam: Filed Vitals:   11/17/12 0242 11/17/12 0634 11/17/12 0700 11/17/12 0933  BP: 111/57 121/75  130/78  Pulse: 78 70  71  Temp: 97.8 F (36.6 C) 97.6 F (36.4 C)  98.2 F (36.8 C)  TempSrc: Oral Oral  Oral  Resp: 20 20  20   Height:      Weight:   94.394 kg (208 lb 1.6 oz)   SpO2: 98% 97%  99%    General: well nourished ambulating in room Cardiovascular: RRR No MGR No LE edema  Respiratory: normal effort Good air flow. BS clear bilaterally No wheeze  Discharge Instructions      Discharge Orders   Future Appointments Provider Department Dept Phone   11/20/2012 4:30 PM Storm Frisk, MD [facility] [phone]   11/24/2012 10:30 AM Randall An, MD [facility] [phone]    12/01/2012 9:15 AM [facility] [phone]   12/02/2012 2:30 PM [facility] Chair 7 [facility] [phone]   12/08/2012 10:30 AM [facility] [phone]   02/10/2013 7:30 AM [facility] [phone]   Pt should arrive15 minutes prior to scheduled appt time. Please inform patient that exam will take a minimum of 1 1/2 hours. Patient to be NPO 6 hours prior to exam  and should not take any insulin the day of exam.   02/12/2013 11:30 AM Randall An, MD [facility] [phone]   Future Orders Complete By Expires     Call MD for:  difficulty breathing, headache or visual disturbances  As directed     Call MD for:  persistant nausea and vomiting  As directed     Call MD for:  temperature >100.4  As directed     Diet - low sodium heart healthy  As directed     Discharge instructions  As directed     Comments:      Take prednisone as ordered. Follow up with Dr. Delford Field on 11/20/12 regarding tapering pace.    Increase activity slowly  As directed         Medication List    TAKE these medications       ALEVE 220 MG tablet  Generic drug:  naproxen sodium  Take 220 mg by mouth daily as needed (for pain).     dacarbazine 200 MG chemo injection  Commonly known as:  DTIC  Per [facility]     DOXOrubicin 50 MG chemo injection  Commonly known as:  ADRIAMYCIN  Per [facility]     etoposide 20 MG/ML injection  Commonly known as:  VEPESID  Per [facility]     guaiFENesin 600 MG 12 hr tablet  Commonly known as:  MUCINEX  Take 2 tablets (1,200 mg total) by mouth 2 (two) times daily.     HYDROcodone-acetaminophen 5-325 MG per tablet  Commonly known as:  NORCO/VICODIN  Take 1-2 tablets by mouth every 4 (four) hours as needed.     moxifloxacin 400 MG tablet  Commonly known as:  AVELOX  Take 1 tablet (400 mg total) by mouth daily.     NEULASTA  6 MG/0.6ML injection  Generic drug:  pegfilgrastim  Inject 6 mg into the skin every 14 (fourteen) days. Per [facility]     ondansetron 8 MG tablet  Commonly known as:  ZOFRAN  Take 8 mg by mouth. Starting the day after chemo, take 1 tablet in the am and 1 tablet in the pm for 3 days. Then may take 1 tablet two times a day IF needed for nausea/vomiting. Side Effect: constipation     predniSONE 20 MG tablet  Commonly known as:  DELTASONE  Take 2 tabs for 3 days then stop     ranitidine 150 MG tablet  Commonly known as:  ZANTAC  Take  150 mg by mouth daily as needed for heartburn.     simethicone 80 MG chewable tablet  Commonly known as:  MYLICON  Chew 80 mg by mouth every 6 (six) hours as needed. For gas     vinBLAStine 10 MG injection  Commonly known as:  VELBAN  Per [facility]     warfarin 5 MG tablet  Commonly known as:  COUMADIN  Take 5-7.5 mg by mouth daily. *Taken at 5pm daily*. Patient is to alternate taking 7.5mg  for 3 days and taking 5mg  on all other days.       Follow-up Information   Follow up with Shan Levans, MD On 11/20/2012. (Pt has appointment 3/28.)    Contact information:   [address] [place] [address] [phone]       Follow up with Randall An, MD. (Chemo delayed for 7 days. keep other scheduled appointments)    Contact information:   [address] [place] [address] [phone]        The results of significant diagnostics from this hospitalization (including imaging, microbiology, ancillary and laboratory) are listed below for reference.    Significant Diagnostic Studies: Dg Chest 2 View  11/15/2012  *RADIOLOGY REPORT*  Clinical Data: Fever  CHEST - 2 VIEW  Comparison: 10/22/2012  Findings: Cardiomediastinal silhouette is stable.  Streaky right infrahilar atelectasis or early infiltrate.  No pulmonary edema. Stable left subclavian Port-A-Cath position.  IMPRESSION: Streaky right infrahilar atelectasis or early  infiltrate.  No pulmonary edema.   Original Report Authenticated By: Natasha Mead, M.D.    Dg Chest 2 View  10/22/2012  *RADIOLOGY REPORT*  Clinical Data: Pneumonia.  CHEST - 2 VIEW  Comparison: 10/16/2012  Findings: Left Port-A-Cath remains in place, unchanged.  Patchy airspace disease within the left lower lobe again noted, not significantly changed.  Minimal density in the right lung bases also stable, atelectasis infiltrate.  No effusions.  Heart is borderline in size.  No acute bony abnormality.  IMPRESSION: Stable appearance of the lungs with left greater than right lower lobe opacities.  Cannot exclude pneumonia.   Original Report Authenticated By: Charlett Nose, M.D.     Microbiology: Recent Results (from the past 240 hour(s))  CULTURE, BLOOD (ROUTINE X 2)     Status: None   Collection Time    11/15/12  3:54 PM      Result Value Range Status   Specimen Description BLOOD LEFT ANTECUBITAL   Final   Special Requests BOTTLES DRAWN AEROBIC AND ANAEROBIC 6CC   Final   Culture NO GROWTH 2 DAYS   Final   Report Status PENDING   Incomplete  CULTURE, BLOOD (ROUTINE X 2)     Status: None   Collection Time    11/15/12  3:59 PM      Result Value Range Status   Specimen Description BLOOD RIGHT ANTECUBITAL   Final   Special Requests BOTTLES DRAWN AEROBIC AND ANAEROBIC 6CC   Final   Culture NO GROWTH 2 DAYS   Final   Report Status PENDING   Incomplete  URINE CULTURE     Status: None   Collection Time    11/15/12  5:22 PM      Result Value Range Status   Specimen Description URINE, CLEAN CATCH   Final   Special Requests NONE   Final   Culture  Setup Time 11/15/2012 20:11   Final   Colony Count NO GROWTH   Final   Culture NO GROWTH  Final   Report Status 11/16/2012 FINAL   Final     Labs: Basic Metabolic Panel:  Recent Labs Lab 11/15/12 1552 11/16/12 0516  NA 135 138  K 3.8 3.9  CL 97 103  CO2 26 25  GLUCOSE 110* 94  BUN 10 11  CREATININE 1.03 0.97  CALCIUM 9.3 8.5  MG 1.4*  --     Liver Function Tests:  Recent Labs Lab 11/15/12 1552  AST 21  ALT 42  ALKPHOS 90  BILITOT 0.6  PROT 6.5  ALBUMIN 3.8   No results found for this basename: LIPASE, AMYLASE,  in the last 168 hours No results found for this basename: AMMONIA,  in the last 168 hours CBC:  Recent Labs Lab 11/15/12 1552 11/16/12 0516 11/17/12 0435  WBC 14.9* 12.1* 8.8  NEUTROABS 11.4*  --   --   HGB 11.8* 10.6* 10.6*  HCT 35.5* 31.6* 32.0*  MCV 89.2 89.5 88.9  PLT 140* 115* 134*   Cardiac Enzymes: No results found for this basename: CKTOTAL, CKMB, CKMBINDEX, TROPONINI,  in the last 168 hours BNP: BNP (last 3 results)  Recent Labs  09/28/12 0830 09/30/12 0506 09/30/12 1206  PROBNP 8.3 41.5 82.6   CBG: No results found for this basename: GLUCAP,  in the last 168 hours     Signed:  Gwenyth Bender  [facility] 11/17/2012, 1:44 PM

## 2012-11-17 NOTE — Progress Notes (Signed)
Port a cath to left chest wall de accessed. Discharge instructions given. Prescriptions given. Pt verbalized understanding of instructions. Left floor ambulatory with nursing staff and family members. Discharged home.

## 2012-11-17 NOTE — Progress Notes (Signed)
ANTICOAGULATION CONSULT NOTE   Pharmacy Consult for Warfarin Indication: VTE Treatment  Allergies  Allergen Reactions  . Bleomycin Other (See Comments)    Pt no longer receiving bleomycin d/t potential lung toxicity. Pt started Bleomycin on 06/09/12 and last dose was 09/14/12.  Marland Kitchen Amoxicillin Rash   Patient Measurements: Height: 5\' 9"  (175.3 cm) Weight: 208 lb 1.6 oz (94.394 kg) IBW/kg (Calculated) : 70.7  Vital Signs: Temp: 98.2 F (36.8 C) (03/25 0933) Temp src: Oral (03/25 0933) BP: 130/78 mmHg (03/25 0933) Pulse Rate: 71 (03/25 0933)  Labs:  Recent Labs  11/15/12 1552 11/16/12 0516 11/17/12 0435  HGB 11.8* 10.6* 10.6*  HCT 35.5* 31.6* 32.0*  PLT 140* 115* 134*  LABPROT 22.3* 24.8* 24.7*  INR 2.05* 2.37* 2.35*  CREATININE 1.03 0.97  --    Estimated Creatinine Clearance: 119.4 ml/min (by C-G formula based on Cr of 0.97).  Medical History: Past Medical History  Diagnosis Date  . Mass of left side of neck 04/2012  . 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 Hodgkin's disease   . Hodgkin lymphoma   . Diastolic dysfunction     Grade 2  . Acute respiratory failure with hypoxia 09/29/2012  . Pulmonary emboli 09/29/2012   Medications:  Scheduled:  . cefTAZidime (FORTAZ)  IV  2 g Intravenous Q8H  . guaiFENesin  1,200 mg Oral BID  . pantoprazole  40 mg Oral Daily  . predniSONE  20 mg Oral BID WC  . [COMPLETED] warfarin  5 mg Oral Once  . warfarin  5 mg Oral Once  . Warfarin - Pharmacist Dosing Inpatient   Does not apply Q24H   Assessment: Continuation of Warfarin from home PTA Coumadin 7.5 mg three times a week, 5 mg the other days INR therapeutic  Goal of Therapy:  INR 2-3 Monitor platelets by anticoagulation protocol: Yes   Plan: Coumadin 5mg  today x 1 (home regimen) INR/PT daily Labs per protocol  Valrie Hart A 11/17/2012,11:17 AM

## 2012-11-18 ENCOUNTER — Ambulatory Visit (HOSPITAL_COMMUNITY): Payer: PRIVATE HEALTH INSURANCE

## 2012-11-18 LAB — PULMONARY FUNCTION TEST

## 2012-11-20 ENCOUNTER — Ambulatory Visit: Payer: PRIVATE HEALTH INSURANCE | Admitting: Critical Care Medicine

## 2012-11-20 LAB — CULTURE, BLOOD (ROUTINE X 2): Culture: NO GROWTH

## 2012-11-23 ENCOUNTER — Telehealth (HOSPITAL_COMMUNITY): Payer: Self-pay | Admitting: *Deleted

## 2012-11-23 ENCOUNTER — Inpatient Hospital Stay (HOSPITAL_COMMUNITY): Payer: PRIVATE HEALTH INSURANCE

## 2012-11-23 NOTE — Telephone Encounter (Signed)
    Prednisone 5 mg daily for 4 days, the 5 mg qod for 8 days then d/c   The above are instructions that Dr. Mariel Sleet gave me regarding a taper dosage and schedule for patient. I called the patient and explained this to him. He wrote it down as I told him. I also instructed patient that it was very important that he reschedule his appointment that he missed this past Friday with Dr. Delford Field. He said he would.

## 2012-11-23 NOTE — Telephone Encounter (Signed)
1. He should see dr Delford Field! 2. Same dose coumadin, inr this Friday.  The above was a series of messages from me to Dr. Mariel Sleet via staff message. Patient is to continue same dose of Coumadin 7.5/5mg  daily and recheck INR this Friday 4/4. Patient verbalizes understanding of all instructions.

## 2012-11-24 ENCOUNTER — Ambulatory Visit (HOSPITAL_COMMUNITY): Payer: PRIVATE HEALTH INSURANCE

## 2012-11-24 ENCOUNTER — Encounter (HOSPITAL_COMMUNITY): Payer: PRIVATE HEALTH INSURANCE | Attending: Oncology | Admitting: Oncology

## 2012-11-24 ENCOUNTER — Encounter (HOSPITAL_BASED_OUTPATIENT_CLINIC_OR_DEPARTMENT_OTHER): Payer: PRIVATE HEALTH INSURANCE

## 2012-11-24 VITALS — BP 123/83 | HR 120 | Temp 98.2°F | Resp 18 | Wt 213.6 lb

## 2012-11-24 DIAGNOSIS — C8190 Hodgkin lymphoma, unspecified, unspecified site: Secondary | ICD-10-CM

## 2012-11-24 DIAGNOSIS — C8119 Nodular sclerosis classical Hodgkin lymphoma, extranodal and solid organ sites: Secondary | ICD-10-CM

## 2012-11-24 DIAGNOSIS — R5381 Other malaise: Secondary | ICD-10-CM

## 2012-11-24 DIAGNOSIS — J984 Other disorders of lung: Secondary | ICD-10-CM

## 2012-11-24 DIAGNOSIS — Z5111 Encounter for antineoplastic chemotherapy: Secondary | ICD-10-CM

## 2012-11-24 LAB — CBC WITH DIFFERENTIAL/PLATELET
Eosinophils Relative: 1 % (ref 0–5)
Hemoglobin: 12.4 g/dL — ABNORMAL LOW (ref 13.0–17.0)
Lymphocytes Relative: 23 % (ref 12–46)
Lymphs Abs: 2.5 10*3/uL (ref 0.7–4.0)
MCV: 89.5 fL (ref 78.0–100.0)
Monocytes Relative: 11 % (ref 3–12)
Platelets: 270 10*3/uL (ref 150–400)
RBC: 4.18 MIL/uL — ABNORMAL LOW (ref 4.22–5.81)
WBC: 10.9 10*3/uL — ABNORMAL HIGH (ref 4.0–10.5)

## 2012-11-24 LAB — COMPREHENSIVE METABOLIC PANEL
ALT: 25 U/L (ref 0–53)
Alkaline Phosphatase: 83 U/L (ref 39–117)
CO2: 26 mEq/L (ref 19–32)
Calcium: 9.2 mg/dL (ref 8.4–10.5)
GFR calc Af Amer: >90 mL/min (ref >90)
GFR calc non Af Amer: >90 mL/min (ref >90)
Glucose, Bld: 125 mg/dL — ABNORMAL HIGH (ref 70–99)
Potassium: 3.8 mEq/L (ref 3.5–5.1)
Sodium: 140 mEq/L (ref 135–145)

## 2012-11-24 MED ORDER — SODIUM CHLORIDE 0.9 % IV SOLN
375.0000 mg/m2 | Freq: Once | INTRAVENOUS | Status: AC
  Administered 2012-11-24: 820 mg via INTRAVENOUS
  Filled 2012-11-24: qty 41

## 2012-11-24 MED ORDER — HEPARIN SOD (PORK) LOCK FLUSH 100 UNIT/ML IV SOLN
INTRAVENOUS | Status: AC
  Filled 2012-11-24: qty 5

## 2012-11-24 MED ORDER — SODIUM CHLORIDE 0.9 % IV SOLN
6.0000 mg/m2 | Freq: Once | INTRAVENOUS | Status: AC
  Administered 2012-11-24: 13 mg via INTRAVENOUS
  Filled 2012-11-24: qty 13

## 2012-11-24 MED ORDER — HEPARIN SOD (PORK) LOCK FLUSH 100 UNIT/ML IV SOLN
500.0000 [IU] | Freq: Once | INTRAVENOUS | Status: AC | PRN
  Administered 2012-11-24: 500 [IU]
  Filled 2012-11-24: qty 5

## 2012-11-24 MED ORDER — SODIUM CHLORIDE 0.9 % IV SOLN
Freq: Once | INTRAVENOUS | Status: AC
  Administered 2012-11-24: 10:00:00 via INTRAVENOUS

## 2012-11-24 MED ORDER — DOXORUBICIN HCL CHEMO IV INJECTION 2 MG/ML
25.0000 mg/m2 | Freq: Once | INTRAVENOUS | Status: AC
  Administered 2012-11-24: 54 mg via INTRAVENOUS
  Filled 2012-11-24: qty 27

## 2012-11-24 MED ORDER — SODIUM CHLORIDE 0.9 % IV SOLN
100.0000 mg/m2 | Freq: Once | INTRAVENOUS | Status: AC
  Administered 2012-11-24: 220 mg via INTRAVENOUS
  Filled 2012-11-24: qty 11

## 2012-11-24 MED ORDER — SODIUM CHLORIDE 0.9 % IV SOLN
Freq: Once | INTRAVENOUS | Status: AC
  Administered 2012-11-24: 16 mg via INTRAVENOUS
  Filled 2012-11-24: qty 8

## 2012-11-24 MED ORDER — SODIUM CHLORIDE 0.9 % IJ SOLN
10.0000 mL | INTRAMUSCULAR | Status: DC | PRN
  Administered 2012-11-24: 10 mL
  Filled 2012-11-24: qty 10

## 2012-11-24 NOTE — Progress Notes (Signed)
Tolerated chemo well today.D/C home accompanied by friend.

## 2012-11-24 NOTE — Progress Notes (Signed)
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 He is status post 4 1/2 cycles now chemotherapy with ABVD. He is here today for cycle 5B. He is asymptomatic though he remains minimally short winded with exertion. He feels almost himself he states. He is on the final taper of the prednisone. We'll finish that in a few days. I should clarify these asymptomatic from a Hodgkin's standpoint. He has no fever no chills no sweats at night.  #2 acute pulmonary/respiratory distress syndrome characterized by disseminated infiltrates bilaterally, no obvious viral or bacterial etiology however but he responded to antibiotics and high-dose steroids. 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 He has recuperated quite well from this. The bleomycin has been discontinued.  #3 weakness and fatigue secondary to chemotherapy and number 1  He looks very good today. He has only 2 or 3 small crackles at the right base posteriorly. The left base has only wanted to crackles at most. Otherwise she is clear throughout both anteriorly and superiorly. His heart shows a regular rhythm and rate without murmur rub gallop. Port-A-Cath is intact he has no lymphadenopathy cervical, subclavicular, infraclavicular, axillary areas. He has no leg edema.  We'll continue with therapy but I want him evaluated every 14 days prior treatment. He will need a PET scan on May 12 prior to radiation therapy to confirm maintenance a complete response. We will get him back to see Dr. Thersa Salt in early May.

## 2012-11-24 NOTE — Patient Instructions (Addendum)
.  [facility] Discharge Instructions  RECOMMENDATIONS MADE BY THE CONSULTANT AND ANY TEST RESULTS WILL BE SENT TO YOUR REFERRING PHYSICIAN.  EXAM FINDINGS BY THE PHYSICIAN TODAY AND SIGNS OR SYMPTOMS TO REPORT TO CLINIC OR PRIMARY PHYSICIAN: Exam good today You have 2 more treatments after today.    INSTRUCTIONS GIVEN AND DISCUSSED: Dr. Mariel Sleet wants your pet scan done May 12th  SPECIAL INSTRUCTIONS/FOLLOW-UP: See Elijah Birk in 2 weeks, however call us next week to let us know how you are See Dr. Mariel Sleet after Pet scans We will be getting you back in to see Dr. Kyla Balzarine as chemo finishes up.  Thank you for choosing [facility] to provide your oncology and hematology care.  To afford each patient quality time with our providers, please arrive at least 15 minutes before your scheduled appointment time.  With your help, our goal is to use those 15 minutes to complete the necessary work-up to ensure our physicians have the information they need to help with your evaluation and healthcare recommendations.    Effective January 1st, 2014, we ask that you re-schedule your appointment with our physicians should you arrive 10 or more minutes late for your appointment.  We strive to give you quality time with our providers, and arriving late affects you and other patients whose appointments are after yours.    Again, thank you for choosing [facility].  Our hope is that these requests will decrease the amount of time that you wait before being seen by our physicians.       _____________________________________________________________  Should you have questions after your visit to [facility], please contact our office at [phone] between the hours of 8:30 a.m. and 5:00 p.m.  Voicemails left after 4:30 p.m. will not be returned until the following business day.  For prescription refill requests, have your pharmacy contact our office with your  prescription refill request.

## 2012-11-25 ENCOUNTER — Encounter: Payer: Self-pay | Admitting: Critical Care Medicine

## 2012-11-25 ENCOUNTER — Encounter (HOSPITAL_BASED_OUTPATIENT_CLINIC_OR_DEPARTMENT_OTHER): Payer: PRIVATE HEALTH INSURANCE

## 2012-11-25 VITALS — BP 131/63 | HR 85 | Temp 98.2°F | Resp 16

## 2012-11-25 DIAGNOSIS — C8119 Nodular sclerosis classical Hodgkin lymphoma, extranodal and solid organ sites: Secondary | ICD-10-CM

## 2012-11-25 DIAGNOSIS — Z5189 Encounter for other specified aftercare: Secondary | ICD-10-CM

## 2012-11-25 DIAGNOSIS — C8190 Hodgkin lymphoma, unspecified, unspecified site: Secondary | ICD-10-CM

## 2012-11-25 MED ORDER — PEGFILGRASTIM INJECTION 6 MG/0.6ML
SUBCUTANEOUS | Status: AC
  Filled 2012-11-25: qty 0.6

## 2012-11-25 MED ORDER — PEGFILGRASTIM INJECTION 6 MG/0.6ML
6.0000 mg | Freq: Once | SUBCUTANEOUS | Status: AC
  Administered 2012-11-25: 6 mg via SUBCUTANEOUS

## 2012-11-25 NOTE — Progress Notes (Signed)
Samuel [name] presents today for injection per MD orders. Neulasta 6mg administered SQ in right Abdomen. Administration without incident. Patient tolerated well.  

## 2012-11-26 ENCOUNTER — Ambulatory Visit (HOSPITAL_COMMUNITY): Payer: PRIVATE HEALTH INSURANCE

## 2012-11-30 ENCOUNTER — Telehealth (HOSPITAL_COMMUNITY): Payer: Self-pay

## 2012-11-30 ENCOUNTER — Encounter (HOSPITAL_COMMUNITY): Payer: PRIVATE HEALTH INSURANCE

## 2012-11-30 ENCOUNTER — Telehealth (HOSPITAL_COMMUNITY): Payer: Self-pay | Admitting: *Deleted

## 2012-11-30 NOTE — Telephone Encounter (Signed)
Patient called to let me know that his INR was 4.1. He thinks he may have taken 7.5mg  2 nights in a row. His usual dose is 7.5/5.   Patient also wants to go back to work and is hoping to start back around May 5. Is he definitely getting XRT also and if so when is it supposed to start? He is just trying to get his work schedule organized.

## 2012-11-30 NOTE — Telephone Encounter (Signed)
Call back confirmation received from patient regarding holding coumadin x 2 days and to recheck INR on Wednesday.

## 2012-11-30 NOTE — Telephone Encounter (Signed)
Message left for patient and instructed to hold coumadin x 2 days and to check INR on Wednesday.  Call back confirmation requested.

## 2012-11-30 NOTE — Telephone Encounter (Signed)
Patient instructed per Dr. Mariel Sleet to hold Coumadin tonight. Recheck INR tomorrow and if not greater than 3, may restart Coumadin @ 5mg  alternating with 7.5mg . Recheck INR on Friday. Patient wrote this down and repeated this back to me.

## 2012-12-01 ENCOUNTER — Telehealth (HOSPITAL_COMMUNITY): Payer: Self-pay | Admitting: *Deleted

## 2012-12-01 ENCOUNTER — Inpatient Hospital Stay (HOSPITAL_COMMUNITY): Payer: PRIVATE HEALTH INSURANCE

## 2012-12-01 NOTE — Telephone Encounter (Signed)
Patient instructed to call and get an appointment with Dr. Thersa Salt between the dates of 4/16 and 4/28 per Dr. Thornton Papas orders. Patient rechecked INR today and it was 2.6. Patient is to resume his coumadin at 5mg  alternating with 7.5mg  per Dr. Thornton Papas orders from yesterday. Patient to recheck INR on Friday. Patient said ok.

## 2012-12-02 ENCOUNTER — Encounter: Payer: Self-pay | Admitting: Oncology

## 2012-12-02 ENCOUNTER — Other Ambulatory Visit (HOSPITAL_COMMUNITY): Payer: Self-pay | Admitting: Oncology

## 2012-12-02 ENCOUNTER — Ambulatory Visit (HOSPITAL_COMMUNITY): Payer: PRIVATE HEALTH INSURANCE

## 2012-12-07 LAB — PROTIME-INR

## 2012-12-07 NOTE — Telephone Encounter (Signed)
I don't know why this keeps asking for additional encounter notes. Just trying to close the chart.  

## 2012-12-08 ENCOUNTER — Encounter (HOSPITAL_BASED_OUTPATIENT_CLINIC_OR_DEPARTMENT_OTHER): Payer: PRIVATE HEALTH INSURANCE | Admitting: Oncology

## 2012-12-08 ENCOUNTER — Inpatient Hospital Stay (HOSPITAL_COMMUNITY): Payer: PRIVATE HEALTH INSURANCE

## 2012-12-08 ENCOUNTER — Encounter (HOSPITAL_BASED_OUTPATIENT_CLINIC_OR_DEPARTMENT_OTHER): Payer: PRIVATE HEALTH INSURANCE

## 2012-12-08 DIAGNOSIS — J984 Other disorders of lung: Secondary | ICD-10-CM

## 2012-12-08 DIAGNOSIS — C8119 Nodular sclerosis classical Hodgkin lymphoma, extranodal and solid organ sites: Secondary | ICD-10-CM

## 2012-12-08 DIAGNOSIS — C8190 Hodgkin lymphoma, unspecified, unspecified site: Secondary | ICD-10-CM

## 2012-12-08 DIAGNOSIS — Z5111 Encounter for antineoplastic chemotherapy: Secondary | ICD-10-CM

## 2012-12-08 DIAGNOSIS — R5381 Other malaise: Secondary | ICD-10-CM

## 2012-12-08 LAB — CBC WITH DIFFERENTIAL/PLATELET
Basophils Relative: 1 % (ref 0–1)
Eosinophils Absolute: 0.1 10*3/uL (ref 0.0–0.7)
HCT: 36.3 % — ABNORMAL LOW (ref 39.0–52.0)
Hemoglobin: 12.1 g/dL — ABNORMAL LOW (ref 13.0–17.0)
Lymphs Abs: 1.8 10*3/uL (ref 0.7–4.0)
MCH: 29.3 pg (ref 26.0–34.0)
MCHC: 33.3 g/dL (ref 30.0–36.0)
Monocytes Absolute: 0.7 10*3/uL (ref 0.1–1.0)
Monocytes Relative: 9 % (ref 3–12)
Neutro Abs: 4.6 10*3/uL (ref 1.7–7.7)
RBC: 4.13 MIL/uL — ABNORMAL LOW (ref 4.22–5.81)

## 2012-12-08 MED ORDER — SODIUM CHLORIDE 0.9 % IV SOLN
100.0000 mg/m2 | Freq: Once | INTRAVENOUS | Status: AC
  Administered 2012-12-08: 220 mg via INTRAVENOUS
  Filled 2012-12-08: qty 11

## 2012-12-08 MED ORDER — HEPARIN SOD (PORK) LOCK FLUSH 100 UNIT/ML IV SOLN
INTRAVENOUS | Status: AC
  Filled 2012-12-08: qty 5

## 2012-12-08 MED ORDER — VINBLASTINE SULFATE CHEMO INJECTION 1 MG/ML
6.0000 mg/m2 | Freq: Once | INTRAVENOUS | Status: AC
  Administered 2012-12-08: 13 mg via INTRAVENOUS
  Filled 2012-12-08: qty 13

## 2012-12-08 MED ORDER — SODIUM CHLORIDE 0.9 % IV SOLN
Freq: Once | INTRAVENOUS | Status: AC
  Administered 2012-12-08: 16 mg via INTRAVENOUS
  Filled 2012-12-08: qty 8

## 2012-12-08 MED ORDER — SODIUM CHLORIDE 0.9 % IV SOLN
375.0000 mg/m2 | Freq: Once | INTRAVENOUS | Status: AC
  Administered 2012-12-08: 820 mg via INTRAVENOUS
  Filled 2012-12-08: qty 41

## 2012-12-08 MED ORDER — SODIUM CHLORIDE 0.9 % IV SOLN
Freq: Once | INTRAVENOUS | Status: AC
  Administered 2012-12-08: 11:00:00 via INTRAVENOUS

## 2012-12-08 MED ORDER — DOXORUBICIN HCL CHEMO IV INJECTION 2 MG/ML
25.0000 mg/m2 | Freq: Once | INTRAVENOUS | Status: AC
  Administered 2012-12-08: 54 mg via INTRAVENOUS
  Filled 2012-12-08: qty 27

## 2012-12-08 MED ORDER — HEPARIN SOD (PORK) LOCK FLUSH 100 UNIT/ML IV SOLN
500.0000 [IU] | Freq: Once | INTRAVENOUS | Status: AC | PRN
  Administered 2012-12-08: 500 [IU]
  Filled 2012-12-08: qty 5

## 2012-12-08 NOTE — Patient Instructions (Addendum)
[facility] Discharge Instructions  RECOMMENDATIONS MADE BY THE CONSULTANT AND ANY TEST RESULTS WILL BE SENT TO YOUR REFERRING PHYSICIAN.  EXAM FINDINGS BY THE PHYSICIAN TODAY AND SIGNS OR SYMPTOMS TO REPORT TO CLINIC OR PRIMARY PHYSICIAN: exam and discussion by PA.  You are doing well.  We will not make any changes.  Call Dr. Bernerd Pho office to get your radiation set up.  MEDICATIONS PRESCRIBED:  none  INSTRUCTIONS GIVEN AND DISCUSSED:  Report uncontrolled nausea or vomiting, fevers, shortness of breath, etc.   SPECIAL INSTRUCTIONS/FOLLOW-UP: See schedule.  Thank you for choosing [facility] to provide your oncology and hematology care.  To afford each patient quality time with our providers, please arrive at least 15 minutes before your scheduled appointment time.  With your help, our goal is to use those 15 minutes to complete the necessary work-up to ensure our physicians have the information they need to help with your evaluation and healthcare recommendations.    Effective January 1st, 2014, we ask that you re-schedule your appointment with our physicians should you arrive 10 or more minutes late for your appointment.  We strive to give you quality time with our providers, and arriving late affects you and other patients whose appointments are after yours.    Again, thank you for choosing [facility].  Our hope is that these requests will decrease the amount of time that you wait before being seen by our physicians.       _____________________________________________________________  Should you have questions after your visit to [facility], please contact our office at [phone] between the hours of 8:30 a.m. and 5:00 p.m.  Voicemails left after 4:30 p.m. will not be returned until the following business day.  For prescription refill requests, have your pharmacy contact our office with your prescription refill request.

## 2012-12-08 NOTE — Progress Notes (Signed)
[name], MD [address] [place] [address]  Hodgkin's disease  CURRENT THERAPY:S/P 5 cycles of ABVD with Etoposide substitutes for bleomycin for Day 15 of cycle 4. 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 Presently starting cycle 6 of chemotherapy with Neulasta support.  INTERVAL HISTORY: Samuel [name] 37 y.o. male returns for  regular  visit for followup of Stage II A. classic nodular sclerosing Hodgkin's disease, presenting with a large mediastinal mass and cervical adenopathy. S/P 5 cycles of ABVD with Etoposide substitutes for bleomycin for Day 15 of cycle 4. 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 Presently starting cycle 6 of chemotherapy with Neulasta support.  Demont is doing very well. He denies any B. symptoms including fevers, chills, and night sweats. His appetite is strong. He denies any unintentional weight loss.  He has not yet contacted Dr. Thersa Salt (radiation oncology) for consideration for involved field radiation. Sherwin said he will contact your office this week or next week.  The patient questions the long he will have to maintain his Port-A-Cath. I recommended he maintain his port for a few months following completion of therapy. He is agreeable to this. He would like to have it removed 3 months following completion of therapy if possible. 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  He questions when he go back to work. We recommended that he may return back to work in the middle of May from a chemotherapy standpoint. The question will remain whether he will be able to tolerate working a full shift in light of his fatigue secondary to therapy. As he was on radiation, fatigue may be more of an issue and therefore may interfere with his ability to work. I've asked him to contact his employer and see how  leaning in that they will be with him. It sounds as though thus far, they've been very flexible for Onalee Hua and he appreciates that. He is going to see if there is part-time or reduced work schedule work available for him.  Hematologically, the patient denies any complaints and ROS questioning is negative. Past Medical History  Diagnosis Date  . Mass of left side of neck 04/2012  . 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 Hodgkin's disease   . Hodgkin lymphoma   . Diastolic dysfunction     Grade 2  . Acute respiratory failure with hypoxia 09/29/2012  . Pulmonary emboli 09/29/2012    has Hodgkin's disease; HCAP (healthcare-associated pneumonia); Thrombocytopenia; Anemia; Pleural effusion on right; Pulmonary emboli; Acute respiratory failure with hypoxia; Hyperglycemia; Former cigarette smoker; Weight gain due to medication; Fever; and Acute upper respiratory infections of unspecified site on his problem list.     is allergic to bleomycin and amoxicillin.  Mr. Pipkins does not currently have medications on file.  Past Surgical History  Procedure Laterality Date  . Eye surgery  as a child  . Mass biopsy  05/05/2012    Procedure: NECK MASS BIOPSY;  Surgeon: [name], MD;  Location: [facility];  Service: ENT;  Laterality: Left;  Excisional biopsy of left neck mass   . Portacath placement  06/03/2012    Procedure: INSERTION PORT-A-CATH;  Surgeon: [name], MD;  Location: [facility];  Service: General;  Laterality: Left;  Insertion of Port-A-Cath Left  Subclavian  . Bone marrow biopsy    . Bone marrow aspiration      Denies any headaches, dizziness, double vision, fevers, chills, night sweats, nausea, vomiting, diarrhea, constipation, chest pain, heart palpitations, shortness of breath, blood in stool, black tarry stool, urinary pain, urinary burning, urinary frequency, hematuria.   PHYSICAL EXAMINATION  ECOG PERFORMANCE STATUS: 0 -  Asymptomatic  There were no vitals filed for this visit.  GENERAL:alert, no distress, well nourished, well developed, comfortable, cooperative and smiling SKIN: skin color, texture, turgor are normal, no rashes or significant lesions HEAD: Normocephalic, No masses, lesions, tenderness or abnormalities EYES: normal, Conjunctiva are pink and non-injected EARS: External ears normal OROPHARYNX:mucous membranes are moist  NECK: supple, trachea midline LYMPH:  not examined BREAST:not examined LUNGS: clear to auscultation  HEART: regular rate & rhythm, no murmurs, no gallops, S1 normal and S2 normal ABDOMEN:abdomen soft, non-tender and normal bowel sounds BACK: Back symmetric, no curvature. EXTREMITIES:no joint deformities, effusion, or inflammation, no skin discoloration, no clubbing, no cyanosis  NEURO: alert & oriented x 3 with fluent speech, no focal motor/sensory deficits    LABORATORY DATA: CBC    Component Value Date/Time   WBC 7.1 12/08/2012 1003   RBC 4.13* 12/08/2012 1003   HGB 12.1* 12/08/2012 1003   HCT 36.3* 12/08/2012 1003   PLT 161 12/08/2012 1003   MCV 87.9 12/08/2012 1003   MCH 29.3 12/08/2012 1003   MCHC 33.3 12/08/2012 1003   RDW 17.9* 12/08/2012 1003   LYMPHSABS 1.8 12/08/2012 1003   MONOABS 0.7 12/08/2012 1003   EOSABS 0.1 12/08/2012 1003   BASOSABS 0.1 12/08/2012 1003      Chemistry      Component Value Date/Time   NA 140 11/24/2012 0911   K 3.8 11/24/2012 0911   CL 104 11/24/2012 0911   CO2 26 11/24/2012 0911   BUN 16 11/24/2012 0911   CREATININE 0.91 11/24/2012 0911      Component Value Date/Time   CALCIUM 9.2 11/24/2012 0911   ALKPHOS 83 11/24/2012 0911   AST 19 11/24/2012 0911   ALT 25 11/24/2012 0911   BILITOT 0.5 11/24/2012 0911         ASSESSMENT:  1. Stage II A. classic nodular sclerosing Hodgkin's disease, presenting with a large mediastinal mass and cervical adenopathy. He is status post 5 cycles now of chemotherapy with ABVD.  He is here today for cycle 6A.  2.  Acute pulmonary/respiratory distress syndrome characterized by disseminated infiltrates bilaterally, no obvious viral or bacterial etiology however but he responded to antibiotics and high-dose steroids. 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 He has recuperated quite well from this. The bleomycin has been discontinued.  3. Weakness and fatigue secondary to chemotherapy and number 1  Patient Active Problem List  Diagnosis  . Hodgkin's disease  . HCAP (healthcare-associated pneumonia)  . Thrombocytopenia  . Anemia  . Pleural effusion on right  . Pulmonary emboli  . Acute respiratory failure with hypoxia  . Hyperglycemia  . Former cigarette smoker  . Weight gain due to medication  . Fever  . Acute upper respiratory infections of unspecified site    PLAN:  1. I personally reviewed and went over laboratory results with the patient. 2. PET scan on 01/04/2013 to confirm maintenance of a complete response. 3. He was informed to contact Dr. Bernerd Pho office (Rad Onc) to set-up involved field radiation.  He will do that this week or next week.  4. Return as scheduled for follow-up.  All questions were answered. The patient knows to call the clinic with any problems, questions or concerns. We can certainly see the patient much sooner if necessary.  The patient and plan discussed with Glenford Peers, MD and he is in agreement with the aforementioned.  KEFALAS,THOMAS

## 2012-12-09 ENCOUNTER — Ambulatory Visit (HOSPITAL_COMMUNITY): Payer: PRIVATE HEALTH INSURANCE

## 2012-12-09 ENCOUNTER — Encounter: Payer: Self-pay | Admitting: Oncology

## 2012-12-09 ENCOUNTER — Encounter (HOSPITAL_BASED_OUTPATIENT_CLINIC_OR_DEPARTMENT_OTHER): Payer: PRIVATE HEALTH INSURANCE

## 2012-12-09 VITALS — BP 122/75 | HR 112 | Temp 98.2°F | Resp 18

## 2012-12-09 DIAGNOSIS — C8190 Hodgkin lymphoma, unspecified, unspecified site: Secondary | ICD-10-CM

## 2012-12-09 DIAGNOSIS — Z5189 Encounter for other specified aftercare: Secondary | ICD-10-CM

## 2012-12-09 DIAGNOSIS — C8119 Nodular sclerosis classical Hodgkin lymphoma, extranodal and solid organ sites: Secondary | ICD-10-CM

## 2012-12-09 MED ORDER — PEGFILGRASTIM INJECTION 6 MG/0.6ML
SUBCUTANEOUS | Status: AC
  Filled 2012-12-09: qty 0.6

## 2012-12-09 MED ORDER — PEGFILGRASTIM INJECTION 6 MG/0.6ML
6.0000 mg | Freq: Once | SUBCUTANEOUS | Status: AC
  Administered 2012-12-09: 6 mg via SUBCUTANEOUS

## 2012-12-09 NOTE — Progress Notes (Signed)
Samuel [name] presents today for injection per MD orders. Neulasta 6mg administered SQ in right Abdomen. Administration without incident. Patient tolerated well.  

## 2012-12-14 LAB — PROTIME-INR

## 2012-12-21 ENCOUNTER — Telehealth (HOSPITAL_COMMUNITY): Payer: Self-pay | Admitting: *Deleted

## 2012-12-21 LAB — PROTIME-INR: INR: 4.4 — AB (ref 0.9–1.1)

## 2012-12-21 NOTE — Telephone Encounter (Signed)
INR 4.4. Patient instructed to hold coumadin tonight and tomorrow night. Restart it at 5mg  daily. Recheck INR on Monday 12/28/12 per Dr. Mariel Sleet. Patient repeated instructions and verbalized understanding.

## 2012-12-21 NOTE — Telephone Encounter (Signed)
Pt checked INR and it was 4.4. His dose of Coumadin is 5/7.5 alternating. He says that he did not mess up the medicine this time.

## 2012-12-22 ENCOUNTER — Encounter (HOSPITAL_BASED_OUTPATIENT_CLINIC_OR_DEPARTMENT_OTHER): Payer: PRIVATE HEALTH INSURANCE

## 2012-12-22 ENCOUNTER — Other Ambulatory Visit (HOSPITAL_COMMUNITY): Payer: Self-pay | Admitting: Oncology

## 2012-12-22 ENCOUNTER — Telehealth (HOSPITAL_COMMUNITY): Payer: Self-pay

## 2012-12-22 VITALS — BP 121/80 | HR 88 | Temp 97.9°F | Resp 18 | Wt 217.0 lb

## 2012-12-22 DIAGNOSIS — I2699 Other pulmonary embolism without acute cor pulmonale: Secondary | ICD-10-CM

## 2012-12-22 DIAGNOSIS — C8190 Hodgkin lymphoma, unspecified, unspecified site: Secondary | ICD-10-CM

## 2012-12-22 DIAGNOSIS — Z5111 Encounter for antineoplastic chemotherapy: Secondary | ICD-10-CM

## 2012-12-22 DIAGNOSIS — C8119 Nodular sclerosis classical Hodgkin lymphoma, extranodal and solid organ sites: Secondary | ICD-10-CM

## 2012-12-22 LAB — CBC WITH DIFFERENTIAL/PLATELET
Basophils Absolute: 0.1 10*3/uL (ref 0.0–0.1)
Eosinophils Relative: 0 % (ref 0–5)
Lymphocytes Relative: 26 % (ref 12–46)
Lymphs Abs: 2 10*3/uL (ref 0.7–4.0)
MCV: 87.6 fL (ref 78.0–100.0)
Neutrophils Relative %: 63 % (ref 43–77)
Platelets: 169 10*3/uL (ref 150–400)
RBC: 3.87 MIL/uL — ABNORMAL LOW (ref 4.22–5.81)
RDW: 17.9 % — ABNORMAL HIGH (ref 11.5–15.5)
WBC: 7.6 10*3/uL (ref 4.0–10.5)

## 2012-12-22 LAB — LACTATE DEHYDROGENASE: LDH: 223 U/L (ref 94–250)

## 2012-12-22 LAB — SEDIMENTATION RATE: Sed Rate: 18 mm/hr — ABNORMAL HIGH (ref 0–16)

## 2012-12-22 LAB — COMPREHENSIVE METABOLIC PANEL
AST: 20 U/L (ref 0–37)
Albumin: 3.5 g/dL (ref 3.5–5.2)
Alkaline Phosphatase: 90 U/L (ref 39–117)
BUN: 12 mg/dL (ref 6–23)
Chloride: 104 mEq/L (ref 96–112)
Creatinine, Ser: 0.86 mg/dL (ref 0.50–1.35)
Potassium: 3.8 mEq/L (ref 3.5–5.1)
Total Protein: 6 g/dL (ref 6.0–8.3)

## 2012-12-22 MED ORDER — HEPARIN SOD (PORK) LOCK FLUSH 100 UNIT/ML IV SOLN
INTRAVENOUS | Status: AC
  Filled 2012-12-22: qty 5

## 2012-12-22 MED ORDER — SODIUM CHLORIDE 0.9 % IV SOLN
Freq: Once | INTRAVENOUS | Status: AC
  Administered 2012-12-22: 11:00:00 via INTRAVENOUS

## 2012-12-22 MED ORDER — SODIUM CHLORIDE 0.9 % IV SOLN
375.0000 mg/m2 | Freq: Once | INTRAVENOUS | Status: AC
  Administered 2012-12-22: 820 mg via INTRAVENOUS
  Filled 2012-12-22: qty 41

## 2012-12-22 MED ORDER — HEPARIN SOD (PORK) LOCK FLUSH 100 UNIT/ML IV SOLN
500.0000 [IU] | Freq: Once | INTRAVENOUS | Status: AC | PRN
  Administered 2012-12-22: 500 [IU]
  Filled 2012-12-22: qty 5

## 2012-12-22 MED ORDER — WARFARIN SODIUM 1 MG PO TABS: ORAL_TABLET | ORAL | Status: DC

## 2012-12-22 MED ORDER — SODIUM CHLORIDE 0.9 % IV SOLN
100.0000 mg/m2 | Freq: Once | INTRAVENOUS | Status: AC
  Administered 2012-12-22: 220 mg via INTRAVENOUS
  Filled 2012-12-22: qty 11

## 2012-12-22 MED ORDER — VINBLASTINE SULFATE CHEMO INJECTION 1 MG/ML
6.0000 mg/m2 | Freq: Once | INTRAVENOUS | Status: AC
  Administered 2012-12-22: 13 mg via INTRAVENOUS
  Filled 2012-12-22: qty 13

## 2012-12-22 MED ORDER — SODIUM CHLORIDE 0.9 % IV SOLN
Freq: Once | INTRAVENOUS | Status: AC
  Administered 2012-12-22: 16 mg via INTRAVENOUS
  Filled 2012-12-22: qty 8

## 2012-12-22 MED ORDER — DOXORUBICIN HCL CHEMO IV INJECTION 2 MG/ML
25.0000 mg/m2 | Freq: Once | INTRAVENOUS | Status: AC
  Administered 2012-12-22: 54 mg via INTRAVENOUS
  Filled 2012-12-22: qty 27

## 2012-12-22 NOTE — Telephone Encounter (Signed)
Per Dr. Mariel Sleet patient is to hold coumadin again tonight (held on 4/28) then begin 5 mg alternating with 6 mg.  To recheck INR on Tuesday.  Rx for coumadin 1 mg e-scribed to patient's pharmacy and patient verbalized understanding of instructions.

## 2012-12-23 ENCOUNTER — Encounter (HOSPITAL_BASED_OUTPATIENT_CLINIC_OR_DEPARTMENT_OTHER): Payer: PRIVATE HEALTH INSURANCE

## 2012-12-23 VITALS — BP 126/78 | HR 94 | Temp 98.3°F | Resp 18

## 2012-12-23 DIAGNOSIS — C8190 Hodgkin lymphoma, unspecified, unspecified site: Secondary | ICD-10-CM

## 2012-12-23 DIAGNOSIS — C8119 Nodular sclerosis classical Hodgkin lymphoma, extranodal and solid organ sites: Secondary | ICD-10-CM

## 2012-12-23 DIAGNOSIS — Z5189 Encounter for other specified aftercare: Secondary | ICD-10-CM

## 2012-12-23 MED ORDER — PEGFILGRASTIM INJECTION 6 MG/0.6ML
SUBCUTANEOUS | Status: AC
  Filled 2012-12-23: qty 0.6

## 2012-12-23 MED ORDER — PEGFILGRASTIM INJECTION 6 MG/0.6ML
6.0000 mg | Freq: Once | SUBCUTANEOUS | Status: AC
  Administered 2012-12-23: 6 mg via SUBCUTANEOUS

## 2012-12-23 NOTE — Progress Notes (Signed)
Samuel [name] presents today for injection per MD orders. Neulasta 6mg  administered SQ in right Abdomen. Administration without incident. Patient tolerated well.

## 2012-12-24 ENCOUNTER — Inpatient Hospital Stay (HOSPITAL_COMMUNITY): Payer: PRIVATE HEALTH INSURANCE

## 2012-12-28 ENCOUNTER — Encounter: Payer: Self-pay | Admitting: Oncology

## 2012-12-29 ENCOUNTER — Encounter (HOSPITAL_COMMUNITY): Payer: Self-pay | Admitting: Oncology

## 2012-12-29 ENCOUNTER — Other Ambulatory Visit (HOSPITAL_COMMUNITY): Payer: Self-pay | Admitting: Oncology

## 2013-01-04 ENCOUNTER — Encounter (HOSPITAL_COMMUNITY): Payer: Self-pay

## 2013-01-04 ENCOUNTER — Encounter (HOSPITAL_COMMUNITY)
Admission: RE | Admit: 2013-01-04 | Discharge: 2013-01-04 | Disposition: A | Discharge status: Home / Self Care | Payer: PRIVATE HEALTH INSURANCE | Source: Ambulatory Visit | Attending: Oncology | Admitting: Oncology

## 2013-01-04 DIAGNOSIS — C8190 Hodgkin lymphoma, unspecified, unspecified site: Secondary | ICD-10-CM | POA: Insufficient documentation

## 2013-01-04 MED ORDER — FLUDEOXYGLUCOSE F - 18 (FDG) INJECTION
18.5000 | Freq: Once | INTRAVENOUS | Status: AC | PRN
  Administered 2013-01-04: 18.5 via INTRAVENOUS

## 2013-01-05 ENCOUNTER — Encounter (HOSPITAL_COMMUNITY): Payer: PRIVATE HEALTH INSURANCE | Attending: Oncology | Admitting: Oncology

## 2013-01-05 ENCOUNTER — Encounter (HOSPITAL_COMMUNITY): Payer: Self-pay | Admitting: Oncology

## 2013-01-05 VITALS — BP 115/75 | HR 81 | Temp 98.8°F | Resp 16 | Wt 214.5 lb

## 2013-01-05 DIAGNOSIS — C8190 Hodgkin lymphoma, unspecified, unspecified site: Secondary | ICD-10-CM | POA: Insufficient documentation

## 2013-01-05 DIAGNOSIS — C8119 Nodular sclerosis classical Hodgkin lymphoma, extranodal and solid organ sites: Secondary | ICD-10-CM

## 2013-01-05 LAB — PROTIME-INR: INR: 3.1 — AB (ref 0.9–1.1)

## 2013-01-05 NOTE — Progress Notes (Signed)
#  1 classic nodular sclerosing Hodgkin's disease, stage IIA, presenting with a large mediastinal mass and cervical adenopathy and left anterior axillary adenopathy/subpectoral adenopathy. He is now status post 6 cycles of chemotherapy. VP-16 was substitute for bleomycin after he developed a severe pulmonary lung problem that we could not rule out was not from bleomycin.  This was done with day 15 of cycle 4.  He achieved a complete remission after 3 cycles of therapy. After his cycle for any he did have delay in therapy due to his severe pulmonary toxicity. He also had a one-week delay after cycle 1A due to low blood counts.  Get blood work you the day which is outstanding. This included a normal LDH as well as normal sedimentation rate etc. His hemoglobin was only mildly low at this time. He does not have bony symptomatology presently. His lungs are clear but his left low cervical neck has one palpable lymph node is 1 cm across and very firm consistent with recurrent disease as seen on his PET scan done yesterday.  They're actually 2 lymph nodes in this area but I can only be sure palpating 1.  I discussed his case today with Dr. Thersa Salt, Dr. Marlaine Hind at [facility], and Dr. Suszanne Conners. Dr. Suszanne Conners will see him tomorrow and set him up as soon as possible or a lymph node biopsy for confirmation. We will then set him up for consultation either [facility] with Dr. Greggory Stallion or Dr. Annia Belt or a few was good at [facility] we will range where every wants to be seen. He may well need high-dose chemotherapy followed by bone marrow transplant and he may still need involved field radiation.

## 2013-01-05 NOTE — Patient Instructions (Signed)
[facility] Discharge Instructions  RECOMMENDATIONS MADE BY THE CONSULTANT AND ANY TEST RESULTS WILL BE SENT TO YOUR REFERRING PHYSICIAN.  EXAM FINDINGS BY THE PHYSICIAN TODAY AND SIGNS OR SYMPTOMS TO REPORT TO CLINIC OR PRIMARY PHYSICIAN: Exam as discussed by Dr. Mariel Sleet.   SPECIAL INSTRUCTIONS/FOLLOW-UP: 1.  Please keep your follow-up appt. with T. Jacalyn Lefevre, PA-C to be seen again in 4 weeks. 2.  You need to see Dr. Suszanne Conners for a biopsy of your lymph nodes - our office will be arranging that as soon as possible. 3.  You can follow-up with Drs. Hurd or [place] at [place] for another opinion - [facility] or [facility] are options as well.  Thank you for choosing [facility] to provide your oncology and hematology care.  To afford each patient quality time with our providers, please arrive at least 15 minutes before your scheduled appointment time.  With your help, our goal is to use those 15 minutes to complete the necessary work-up to ensure our physicians have the information they need to help with your evaluation and healthcare recommendations.    Effective January 1st, 2014, we ask that you re-schedule your appointment with our physicians should you arrive 10 or more minutes late for your appointment.  We strive to give you quality time with our providers, and arriving late affects you and other patients whose appointments are after yours.    Again, thank you for choosing [facility].  Our hope is that these requests will decrease the amount of time that you wait before being seen by our physicians.       _____________________________________________________________  Should you have questions after your visit to [facility], please contact our office at [phone] between the hours of 8:30 a.m. and 5:00 p.m.  Voicemails left after 4:30 p.m. will not be returned until the following business day.  For prescription refill requests, have your pharmacy  contact our office with your prescription refill request.

## 2013-01-06 ENCOUNTER — Telehealth (HOSPITAL_COMMUNITY): Payer: Self-pay | Admitting: Oncology

## 2013-01-06 ENCOUNTER — Encounter (HOSPITAL_BASED_OUTPATIENT_CLINIC_OR_DEPARTMENT_OTHER): Payer: Self-pay | Admitting: *Deleted

## 2013-01-06 NOTE — Telephone Encounter (Signed)
Samuel [name] reports being seen today by Dr. Suszanne Conners, who will perform his biopsy on 01/13/13.  Per Dr. Mariel Sleet, Samuel [name] needs to be set-up to see Dr. Greggory Stallion the week of 5/19 - scheduling communication for patient referral sent to [facility].

## 2013-01-06 NOTE — Progress Notes (Signed)
Pt chart reviewed with Dr Gypsy Balsam, [place] for [facility]

## 2013-01-11 ENCOUNTER — Encounter: Payer: Self-pay | Admitting: Oncology

## 2013-01-12 ENCOUNTER — Encounter (HOSPITAL_BASED_OUTPATIENT_CLINIC_OR_DEPARTMENT_OTHER)
Admission: RE | Admit: 2013-01-12 | Discharge: 2013-01-12 | Disposition: A | Discharge status: Home / Self Care | Payer: PRIVATE HEALTH INSURANCE | Source: Ambulatory Visit | Attending: Otolaryngology | Admitting: Otolaryngology

## 2013-01-12 LAB — PROTIME-INR
INR: 0.94 (ref 0.00–1.49)
Prothrombin Time: 12.5 seconds (ref 11.6–15.2)

## 2013-01-13 ENCOUNTER — Encounter (HOSPITAL_BASED_OUTPATIENT_CLINIC_OR_DEPARTMENT_OTHER): Payer: Self-pay | Admitting: *Deleted

## 2013-01-13 ENCOUNTER — Encounter (HOSPITAL_BASED_OUTPATIENT_CLINIC_OR_DEPARTMENT_OTHER)
Admission: RE | Disposition: A | Discharge status: Home / Self Care | Payer: Self-pay | Source: Ambulatory Visit | Attending: Otolaryngology

## 2013-01-13 ENCOUNTER — Ambulatory Visit (HOSPITAL_BASED_OUTPATIENT_CLINIC_OR_DEPARTMENT_OTHER)
Admission: RE | Admit: 2013-01-13 | Discharge: 2013-01-13 | Disposition: A | Discharge status: Home / Self Care | Payer: PRIVATE HEALTH INSURANCE | Source: Ambulatory Visit | Attending: Otolaryngology | Admitting: Otolaryngology

## 2013-01-13 ENCOUNTER — Ambulatory Visit (HOSPITAL_BASED_OUTPATIENT_CLINIC_OR_DEPARTMENT_OTHER): Payer: PRIVATE HEALTH INSURANCE | Admitting: *Deleted

## 2013-01-13 DIAGNOSIS — C8191 Hodgkin lymphoma, unspecified, lymph nodes of head, face, and neck: Secondary | ICD-10-CM | POA: Insufficient documentation

## 2013-01-13 DIAGNOSIS — C8190 Hodgkin lymphoma, unspecified, unspecified site: Secondary | ICD-10-CM

## 2013-01-13 DIAGNOSIS — Z9221 Personal history of antineoplastic chemotherapy: Secondary | ICD-10-CM | POA: Insufficient documentation

## 2013-01-13 HISTORY — PX: MASS BIOPSY: SHX5445

## 2013-01-13 HISTORY — DX: Gastro-esophageal reflux disease without esophagitis: K21.9

## 2013-01-13 HISTORY — DX: Shortness of breath: R06.02

## 2013-01-13 SURGERY — BIOPSY, MASS, NECK
Anesthesia: General | Site: Neck | Laterality: Left | Wound class: Clean

## 2013-01-13 MED ORDER — HYDROMORPHONE HCL PF 1 MG/ML IJ SOLN: 0.2500 mg | INTRAMUSCULAR | Status: DC | PRN

## 2013-01-13 MED ORDER — LIDOCAINE HCL (CARDIAC) 20 MG/ML IV SOLN
INTRAVENOUS | Status: DC | PRN
  Administered 2013-01-13: 80 mg via INTRAVENOUS

## 2013-01-13 MED ORDER — FENTANYL CITRATE 0.05 MG/ML IJ SOLN: 50.0000 ug | INTRAMUSCULAR | Status: DC | PRN

## 2013-01-13 MED ORDER — MIDAZOLAM HCL 5 MG/5ML IJ SOLN
INTRAMUSCULAR | Status: DC | PRN
  Administered 2013-01-13: 1.5 mg via INTRAVENOUS

## 2013-01-13 MED ORDER — PROPOFOL 10 MG/ML IV BOLUS
INTRAVENOUS | Status: DC | PRN
  Administered 2013-01-13: 170 mg via INTRAVENOUS

## 2013-01-13 MED ORDER — ONDANSETRON HCL 4 MG/2ML IJ SOLN
INTRAMUSCULAR | Status: DC | PRN
  Administered 2013-01-13: 4 mg via INTRAVENOUS

## 2013-01-13 MED ORDER — FENTANYL CITRATE 0.05 MG/ML IJ SOLN
INTRAMUSCULAR | Status: DC | PRN
  Administered 2013-01-13: 25 ug via INTRAVENOUS
  Administered 2013-01-13: 75 ug via INTRAVENOUS

## 2013-01-13 MED ORDER — LIDOCAINE-EPINEPHRINE 1 %-1:100000 IJ SOLN
INTRAMUSCULAR | Status: DC | PRN
  Administered 2013-01-13: 2 mL

## 2013-01-13 MED ORDER — OXYCODONE-ACETAMINOPHEN 5-325 MG PO TABS: 1.0000 | ORAL_TABLET | ORAL | Status: DC | PRN

## 2013-01-13 MED ORDER — 0.9 % SODIUM CHLORIDE (POUR BTL) OPTIME
TOPICAL | Status: DC | PRN
  Administered 2013-01-13: 200 mL

## 2013-01-13 MED ORDER — PROMETHAZINE HCL 25 MG/ML IJ SOLN: 6.2500 mg | INTRAMUSCULAR | Status: DC | PRN

## 2013-01-13 MED ORDER — OXYCODONE HCL 5 MG PO TABS
5.0000 mg | ORAL_TABLET | Freq: Once | ORAL | Status: DC | PRN
Start: 2013-01-13 — End: 2013-01-13

## 2013-01-13 MED ORDER — OXYCODONE HCL 5 MG/5ML PO SOLN: 5.0000 mg | Freq: Once | ORAL | Status: DC | PRN

## 2013-01-13 MED ORDER — MEPERIDINE HCL 25 MG/ML IJ SOLN: 6.2500 mg | INTRAMUSCULAR | Status: DC | PRN

## 2013-01-13 MED ORDER — CEFAZOLIN SODIUM-DEXTROSE 2-3 GM-% IV SOLR
INTRAVENOUS | Status: DC | PRN
  Administered 2013-01-13: 2 g via INTRAVENOUS

## 2013-01-13 MED ORDER — DEXAMETHASONE SODIUM PHOSPHATE 4 MG/ML IJ SOLN
INTRAMUSCULAR | Status: DC | PRN
  Administered 2013-01-13: 10 mg via INTRAVENOUS

## 2013-01-13 MED ORDER — MIDAZOLAM HCL 2 MG/2ML IJ SOLN: 1.0000 mg | INTRAMUSCULAR | Status: DC | PRN

## 2013-01-13 MED ORDER — LACTATED RINGERS IV SOLN
INTRAVENOUS | Status: DC
  Administered 2013-01-13: 11:00:00 via INTRAVENOUS

## 2013-01-13 SURGICAL SUPPLY — 69 items
BENZOIN TINCTURE PRP APPL 2/3 (GAUZE/BANDAGES/DRESSINGS) IMPLANT
BLADE SURG 15 STRL LF DISP TIS (BLADE) ×1 IMPLANT
BLADE SURG 15 STRL SS (BLADE) ×1
CANISTER SUCTION 1200CC (MISCELLANEOUS) ×2 IMPLANT
CLEANER CAUTERY TIP 5X5 PAD (MISCELLANEOUS) IMPLANT
CLIP TI MEDIUM 6 (CLIP) IMPLANT
CLIP TI WIDE RED SMALL 6 (CLIP) IMPLANT
CLOTH BEACON ORANGE TIMEOUT ST (SAFETY) ×2 IMPLANT
CORDS BIPOLAR (ELECTRODE) ×2 IMPLANT
COVER MAYO STAND STRL (DRAPES) ×2 IMPLANT
COVER TABLE BACK 60X90 (DRAPES) ×2 IMPLANT
DECANTER SPIKE VIAL GLASS SM (MISCELLANEOUS) ×2 IMPLANT
DERMABOND ADVANCED (GAUZE/BANDAGES/DRESSINGS) ×1
DERMABOND ADVANCED .7 DNX12 (GAUZE/BANDAGES/DRESSINGS) ×1 IMPLANT
DRAIN JACKSON RD 7FR 3/32 (WOUND CARE) IMPLANT
DRAIN PENROSE 1/4X12 LTX STRL (WOUND CARE) IMPLANT
DRAIN TLS ROUND 10FR (DRAIN) IMPLANT
DRAPE U-SHAPE 76X120 STRL (DRAPES) ×2 IMPLANT
ELECT COATED BLADE 2.86 ST (ELECTRODE) ×2 IMPLANT
ELECT NEEDLE BLADE 2-5/6 (NEEDLE) IMPLANT
ELECT PAIRED SUBDERMAL (MISCELLANEOUS)
ELECT REM PT RETURN 9FT ADLT (ELECTROSURGICAL) ×2
ELECTRODE PAIRED SUBDERMAL (MISCELLANEOUS) IMPLANT
ELECTRODE REM PT RTRN 9FT ADLT (ELECTROSURGICAL) ×1 IMPLANT
EVACUATOR SILICONE 100CC (DRAIN) IMPLANT
GAUZE SPONGE 4X4 12PLY STRL LF (GAUZE/BANDAGES/DRESSINGS) IMPLANT
GAUZE SPONGE 4X4 16PLY XRAY LF (GAUZE/BANDAGES/DRESSINGS) IMPLANT
GLOVE BIO SURGEON STRL SZ7.5 (GLOVE) ×2 IMPLANT
GLOVE EXAM NITRILE MD LF STRL (GLOVE) ×2 IMPLANT
GLOVE SURG SS PI 7.0 STRL IVOR (GLOVE) ×2 IMPLANT
GOWN PREVENTION PLUS XLARGE (GOWN DISPOSABLE) IMPLANT
HEMOSTAT SURGICEL .5X2 ABSORB (HEMOSTASIS) IMPLANT
LOCATOR NERVE 3 VOLT (DISPOSABLE) IMPLANT
NEEDLE 27GAX1X1/2 (NEEDLE) ×2 IMPLANT
NEEDLE HYPO 25X1 1.5 SAFETY (NEEDLE) IMPLANT
NS IRRIG 1000ML POUR BTL (IV SOLUTION) ×2 IMPLANT
PACK BASIN DAY SURGERY FS (CUSTOM PROCEDURE TRAY) ×2 IMPLANT
PAD CLEANER CAUTERY TIP 5X5 (MISCELLANEOUS)
PENCIL BUTTON HOLSTER BLD 10FT (ELECTRODE) ×2 IMPLANT
PIN SAFETY STERILE (MISCELLANEOUS) IMPLANT
PROBE NERVBE PRASS .33 (MISCELLANEOUS) IMPLANT
SLEEVE SCD COMPRESS KNEE MED (MISCELLANEOUS) ×2 IMPLANT
SPONGE GAUZE 2X2 8PLY STRL LF (GAUZE/BANDAGES/DRESSINGS) IMPLANT
STAPLER VISISTAT 35W (STAPLE) IMPLANT
STRIP CLOSURE SKIN 1/4X4 (GAUZE/BANDAGES/DRESSINGS) IMPLANT
SUCTION FRAZIER TIP 10 FR DISP (SUCTIONS) IMPLANT
SUT CHROMIC 3 0 PS 2 (SUTURE) IMPLANT
SUT CHROMIC 4 0 P 3 18 (SUTURE) IMPLANT
SUT ETHILON 3 0 PS 1 (SUTURE) IMPLANT
SUT ETHILON 4 0 PS 2 18 (SUTURE) IMPLANT
SUT ETHILON 5 0 P 3 18 (SUTURE)
SUT NYLON ETHILON 5-0 P-3 1X18 (SUTURE) IMPLANT
SUT PLAIN 5 0 P 3 18 (SUTURE) IMPLANT
SUT PROLENE 4 0 P 3 18 (SUTURE) IMPLANT
SUT PROLENE 5 0 P 3 (SUTURE) IMPLANT
SUT SILK 3 0 TIES 17X18 (SUTURE)
SUT SILK 3-0 18XBRD TIE BLK (SUTURE) IMPLANT
SUT SILK 4 0 TIES 17X18 (SUTURE) ×2 IMPLANT
SUT VIC AB 3-0 FS2 27 (SUTURE) IMPLANT
SUT VIC AB 4-0 P-3 18XBRD (SUTURE) IMPLANT
SUT VIC AB 4-0 P3 18 (SUTURE)
SUT VIC AB 4-0 RB1 27 (SUTURE)
SUT VIC AB 4-0 RB1 27X BRD (SUTURE) IMPLANT
SUT VICRYL 4-0 PS2 18IN ABS (SUTURE) ×2 IMPLANT
SYR BULB 3OZ (MISCELLANEOUS) IMPLANT
SYR CONTROL 10ML LL (SYRINGE) ×2 IMPLANT
TOWEL OR 17X24 6PK STRL BLUE (TOWEL DISPOSABLE) ×2 IMPLANT
TRAY DSU PREP LF (CUSTOM PROCEDURE TRAY) ×2 IMPLANT
TUBE CONNECTING 20X1/4 (TUBING) IMPLANT

## 2013-01-13 NOTE — Anesthesia Preprocedure Evaluation (Signed)
Anesthesia Evaluation  Patient identified by MRN, date of birth, ID band Patient awake    Airway Mallampati: I      Dental  (+) Teeth Intact   Pulmonary shortness of breath, pneumonia -, former smoker,  Hx of PE breath sounds clear to auscultation        Cardiovascular Rhythm:Regular Rate:Normal     Neuro/Psych    GI/Hepatic GERD-  ,  Endo/Other    Renal/GU      Musculoskeletal   Abdominal   Peds  Hematology  (+) Blood dyscrasia, , hodgkins disease   Anesthesia Other Findings   Reproductive/Obstetrics                           Anesthesia Physical Anesthesia Plan  ASA: II  Anesthesia Plan: General   Post-op Pain Management:    Induction: Intravenous  Airway Management Planned: LMA  Additional Equipment:   Intra-op Plan:   Post-operative Plan: Extubation in OR  Informed Consent: I have reviewed the patients History and Physical, chart, labs and discussed the procedure including the risks, benefits and alternatives for the proposed anesthesia with the patient or authorized representative who has indicated his/her understanding and acceptance.   Dental advisory given  Plan Discussed with: CRNA and Surgeon  Anesthesia Plan Comments: (Bleomycin pulm toxicity and embolus)        Anesthesia Quick Evaluation

## 2013-01-13 NOTE — Anesthesia Postprocedure Evaluation (Signed)
  Anesthesia Post-op Note  Patient: Samuel [name]  Procedure(s) Performed: Procedure(s): LYMPH NODE BIOPSY (Left)  Patient Location: PACU  Anesthesia Type:General  Level of Consciousness: awake and alert   Airway and Oxygen Therapy: Patient Spontanous Breathing  Post-op Pain: mild  Post-op Assessment: Post-op Vital signs reviewed  Post-op Vital Signs: stable  Complications: No apparent anesthesia complications

## 2013-01-13 NOTE — Brief Op Note (Signed)
01/13/2013  11:57 AM  PATIENT:  Hilliard Clark  37 y.o. male  PRE-OPERATIVE DIAGNOSIS:  ENLARGED LYMPH NODE - LEFT NECK  POST-OPERATIVE DIAGNOSIS: ENLARGED LYMPH NODE - LEFT NECK (History of Lymphoma)  PROCEDURE:  Procedure(s): DEEP CERVICAL LYMPH NODE BIOPSY (Left)  SURGEON:  Surgeon(s) and Role:    * [name], MD - Primary  PHYSICIAN ASSISTANT:   ASSISTANTS: none   ANESTHESIA:   general  EBL:  Total I/O In: 200 [I.V.:200] Out: -   BLOOD ADMINISTERED:none  DRAINS: none   LOCAL MEDICATIONS USED:  LIDOCAINE   SPECIMEN:  Source of Specimen:  Left neck lymph nodes x2  DISPOSITION OF SPECIMEN:  PATHOLOGY  COUNTS:  YES  TOURNIQUET:  * No tourniquets in log *  DICTATION: .Other Dictation: Dictation Number [id number]  PLAN OF CARE: Discharge to home after PACU  PATIENT DISPOSITION:  PACU - hemodynamically stable.   Delay start of Pharmacological VTE agent (>24hrs) due to surgical blood loss or risk of bleeding: not applicable

## 2013-01-13 NOTE — Anesthesia Procedure Notes (Signed)
Procedure Name: LMA Insertion Date/Time: 01/13/2013 11:07 AM Performed by: Meyer Russel Pre-anesthesia Checklist: Patient identified, Emergency Drugs available, Suction available and Patient being monitored Patient Re-evaluated:Patient Re-evaluated prior to inductionOxygen Delivery Method: Circle system utilized Preoxygenation: Pre-oxygenation with 100% oxygen Intubation Type: IV induction Ventilation: Mask ventilation without difficulty LMA: LMA inserted LMA Size: 5.0 Number of attempts: 1 Airway Equipment and Method: Bite block Placement Confirmation: breath sounds checked- equal and bilateral,  positive ETCO2 and CO2 detector Tube secured with: Tape

## 2013-01-13 NOTE — Transfer of Care (Signed)
Immediate Anesthesia Transfer of Care Note  Patient: Samuel [name]  Procedure(s) Performed: Procedure(s): LYMPH NODE BIOPSY (Left)  Patient Location: PACU  Anesthesia Type:General  Level of Consciousness: awake, alert  and oriented  Airway & Oxygen Therapy: Patient Spontanous Breathing and Patient connected to face mask  Post-op Assessment: Report given to PACU RN, Post -op Vital signs reviewed and stable and Patient moving all extremities  Post vital signs: Reviewed and stable  Complications: No apparent anesthesia complications

## 2013-01-13 NOTE — H&P (Signed)
H&P Update  Pt's original H&P dated 01/06/13 reviewed and placed in chart (to be scanned).  I personally examined the patient today.  No change in health. Proceed with left neck lymph node biopsy.

## 2013-01-14 ENCOUNTER — Encounter (HOSPITAL_BASED_OUTPATIENT_CLINIC_OR_DEPARTMENT_OTHER): Payer: Self-pay | Admitting: Otolaryngology

## 2013-01-14 LAB — POCT HEMOGLOBIN-HEMACUE: Hemoglobin: 11.2 g/dL — ABNORMAL LOW (ref 13.0–17.0)

## 2013-01-14 NOTE — Op Note (Signed)
NAME:  Samuel [name], Samuel [name]                 ACCOUNT NO.:  [id number]  MEDICAL RECORD NO.:  [id number]  LOCATION:                                 FACILITY:  PHYSICIAN:  Newman Pies, MD            DATE OF BIRTH:  [birth year]  DATE OF PROCEDURE:  01/12/2013 DATE OF DISCHARGE:                              OPERATIVE REPORT   SURGEON:  Newman Pies, MD  PREOPERATIVE DIAGNOSIS:  Enlarged left cervical lymph node.  POSTOPERATIVE DIAGNOSIS:  Enlarged left cervical lymph node.  PROCEDURE PERFORMED:  Excisional biopsy of the left deep cervical lymph node.  ANESTHESIA:  General endotracheal tube anesthesia.  COMPLICATIONS:  None.  ESTIMATED BLOOD LOSS:  Minimal.  INDICATION FOR PROCEDURE:  The patient is a 37 year old male with a history of left neck Hodgkin's lymphoma.  He was successfully treated with chemotherapy with remission after 3 treatments.  However, on his most recent PET scan, hyperactivity was noted in 2 lymph nodes on the left side of his neck.  On examination, a palpable mass was noted within the left level 5 neck.  Based on the above findings, the decision was made for the patient to undergo excisional biopsy of the left deep neck lymph nodes.  The risks, benefits, alternatives, and details of the procedure were discussed with the patient.  Questions were invited and answered.  Informed consent was obtained.  DESCRIPTION:  The patient was taken to the operating room and placed supine on the operating table.  General endotracheal tube anesthesia was administered by the anesthesiologist.  Preop IV antibiotics was given. The patient was positioned and prepped and draped in a standard fashion for left neck surgery.  1% lidocaine 1:100,000 epinephrine was injected at the planned site of incision.  A transverse left lower neck incision was made.  The incision was carried down past the level of the platysma muscles.  The subplatysmal flaps were carefully elevated and retracted laterally,  exposing the external jugular vein and the sternocleidomastoid muscles.  The palpable mass was noted on the posterior edge of the sternocleidomastoid muscles.  The SCM muscle was then retracted anteriorly.  Two 1.5 cm enlarged lymph nodes were noted deep underneath sternocleidomastoid muscle.  The lymph nodes were carefully dissected free from the surrounding tissue.  At this time, a motor nerve, possibly a branch of the spinal accessory nerve was noted to be tightly adherent to the lymph nodes.  The nerve was dissected from the lymph nodes.  Both enlarged lymph nodes were resected and sent to the Pathology Department for fresh specimen analysis.  The surgical site was copiously irrigated.  Hemostasis was achieved with bipolar electrocautery.  The incision was closed in layers with 4-0 Vicryl and Dermabond.  That concluded procedure for the patient.  The care of the patient was turned over to the anesthesiologist.  The patient was awakened from anesthesia without difficulty.  He was extubated and transferred to the recovery room in good condition.  OPERATIVE FINDINGS:  Two enlarged firm lymph nodes were noted deep to the sternocleidomastoid muscles on the left side.  Both lymph nodes were resected and sent to the Pathology  Department for further analysis.  SPECIMEN:  Left neck lymph nodes.  FOLLOWUP CARE:  The patient will be discharged home once he is awake and alert.  He will be given Percocet p.r.n. pain.  The patient will follow up in my office in 1 week.     Newman Pies, MD     ST/MEDQ  D:  01/13/2013  T:  01/13/2013  Job:  [id number]  cc:   Ladona Horns. Mariel Sleet, MD

## 2013-01-20 ENCOUNTER — Telehealth: Payer: Self-pay | Admitting: Family Medicine

## 2013-01-20 NOTE — Telephone Encounter (Signed)
Nurses/Dr. Lorin Picket  Message: Mr. & Mrs. Leider has some questions / concerns about his recent test and the results. They would like to have a callback at [phone].

## 2013-01-20 NOTE — Telephone Encounter (Signed)
Wife Kennyth Arnold stated pt's oncologist Dr. Adolm Joseph gave recent lab results to pt and wife. Would like to speak with you about this recent testing and results when able.

## 2013-01-21 ENCOUNTER — Ambulatory Visit (INDEPENDENT_AMBULATORY_CARE_PROVIDER_SITE_OTHER): Payer: PRIVATE HEALTH INSURANCE | Admitting: Otolaryngology

## 2013-01-21 NOTE — Telephone Encounter (Signed)
i discussed all of this in detail. They will see MD at [facility] and then will call if they need me to set appt with [facility] for second opinion

## 2013-02-01 ENCOUNTER — Ambulatory Visit (HOSPITAL_COMMUNITY): Payer: PRIVATE HEALTH INSURANCE | Admitting: Oncology

## 2013-02-01 ENCOUNTER — Telehealth (HOSPITAL_COMMUNITY): Payer: Self-pay | Admitting: *Deleted

## 2013-02-01 LAB — PROTIME-INR: INR: 2.4 — AB (ref 0.9–1.1)

## 2013-02-01 MED ORDER — WARFARIN SODIUM 5 MG PO TABS: 5.0000 mg | ORAL_TABLET | Freq: Every day | ORAL | Status: DC

## 2013-02-01 NOTE — Telephone Encounter (Signed)
Patient is not coming for appt today. Will be following with Dr. Greggory Stallion as discussed. He does however need refills on 5 mg coumadin. Will be doing a PT check today.

## 2013-02-01 NOTE — Progress Notes (Signed)
-  Appt cancelled as patient is being seen by Dr. Greggory Stallion at [facility].  Refill on Coumadin provided [facility]-

## 2013-02-03 ENCOUNTER — Encounter (HOSPITAL_COMMUNITY): Payer: PRIVATE HEALTH INSURANCE

## 2013-02-04 ENCOUNTER — Encounter: Payer: Self-pay | Admitting: Oncology

## 2013-02-10 ENCOUNTER — Other Ambulatory Visit (HOSPITAL_COMMUNITY): Payer: PRIVATE HEALTH INSURANCE

## 2013-02-11 ENCOUNTER — Encounter: Payer: Self-pay | Admitting: Oncology

## 2013-02-12 ENCOUNTER — Ambulatory Visit (HOSPITAL_COMMUNITY): Payer: PRIVATE HEALTH INSURANCE | Admitting: Oncology

## 2013-02-15 ENCOUNTER — Encounter (HOSPITAL_COMMUNITY): Payer: PRIVATE HEALTH INSURANCE

## 2013-02-15 ENCOUNTER — Encounter (HOSPITAL_COMMUNITY): Payer: PRIVATE HEALTH INSURANCE | Attending: Oncology

## 2013-02-15 VITALS — BP 112/82 | HR 95 | Temp 97.7°F | Resp 16

## 2013-02-15 DIAGNOSIS — C8190 Hodgkin lymphoma, unspecified, unspecified site: Secondary | ICD-10-CM | POA: Insufficient documentation

## 2013-02-15 DIAGNOSIS — C8119 Nodular sclerosis classical Hodgkin lymphoma, extranodal and solid organ sites: Secondary | ICD-10-CM

## 2013-02-15 DIAGNOSIS — Z5189 Encounter for other specified aftercare: Secondary | ICD-10-CM

## 2013-02-15 LAB — PROTIME-INR
INR: 0.99 (ref 0.00–1.49)
Prothrombin Time: 13 seconds (ref 11.6–15.2)

## 2013-02-15 LAB — DIFFERENTIAL
Lymphocytes Relative: 14 % (ref 12–46)
Lymphs Abs: 0.9 10*3/uL (ref 0.7–4.0)
Neutrophils Relative %: 84 % — ABNORMAL HIGH (ref 43–77)

## 2013-02-15 LAB — COMPREHENSIVE METABOLIC PANEL
ALT: 27 U/L (ref 0–53)
BUN: 20 mg/dL (ref 6–23)
Calcium: 9.3 mg/dL (ref 8.4–10.5)
GFR calc Af Amer: >90 mL/min (ref >90)
Glucose, Bld: 157 mg/dL — ABNORMAL HIGH (ref 70–99)
Sodium: 136 mEq/L (ref 135–145)
Total Protein: 7 g/dL (ref 6.0–8.3)

## 2013-02-15 LAB — CBC
Hemoglobin: 15.2 g/dL (ref 13.0–17.0)
MCH: 29.2 pg (ref 26.0–34.0)
MCHC: 33.9 g/dL (ref 30.0–36.0)
RDW: 13.9 % (ref 11.5–15.5)

## 2013-02-15 LAB — MAGNESIUM: Magnesium: 2.1 mg/dL (ref 1.5–2.5)

## 2013-02-15 MED ORDER — PEGFILGRASTIM INJECTION 6 MG/0.6ML
6.0000 mg | Freq: Once | SUBCUTANEOUS | Status: AC
  Administered 2013-02-15: 6 mg via SUBCUTANEOUS

## 2013-02-15 MED ORDER — PEGFILGRASTIM INJECTION 6 MG/0.6ML
SUBCUTANEOUS | Status: AC
  Filled 2013-02-15: qty 0.6

## 2013-02-15 NOTE — Progress Notes (Signed)
Samuel [name] presents today for injection per the provider's orders (Dr. Greggory Stallion, [facility]).  Neulasta administered without incident; see MAR for injection details.  Patient tolerated procedure well and without incident.  No questions or complaints noted at this time.

## 2013-02-15 NOTE — Progress Notes (Signed)
Labs drawn today for cbc/diff,cmp,mg,pt

## 2013-02-18 ENCOUNTER — Encounter: Payer: Self-pay | Admitting: *Deleted

## 2013-02-18 ENCOUNTER — Encounter (HOSPITAL_BASED_OUTPATIENT_CLINIC_OR_DEPARTMENT_OTHER): Payer: PRIVATE HEALTH INSURANCE

## 2013-02-18 DIAGNOSIS — C8190 Hodgkin lymphoma, unspecified, unspecified site: Secondary | ICD-10-CM

## 2013-02-18 LAB — COMPREHENSIVE METABOLIC PANEL
ALT: 25 U/L (ref 0–53)
Alkaline Phosphatase: 125 U/L — ABNORMAL HIGH (ref 39–117)
CO2: 26 mEq/L (ref 19–32)
GFR calc Af Amer: >90 mL/min (ref >90)
Glucose, Bld: 104 mg/dL — ABNORMAL HIGH (ref 70–99)
Potassium: 4.4 mEq/L (ref 3.5–5.1)
Sodium: 137 mEq/L (ref 135–145)
Total Protein: 6.8 g/dL (ref 6.0–8.3)

## 2013-02-18 LAB — CBC WITH DIFFERENTIAL/PLATELET
Eosinophils Absolute: 0 10*3/uL (ref 0.0–0.7)
Lymphocytes Relative: 55 % — ABNORMAL HIGH (ref 12–46)
Lymphs Abs: 0.8 10*3/uL (ref 0.7–4.0)
Neutrophils Relative %: 37 % — ABNORMAL LOW (ref 43–77)
Platelets: 93 10*3/uL — ABNORMAL LOW (ref 150–400)
RBC: 4.63 MIL/uL (ref 4.22–5.81)
WBC: 1.4 10*3/uL — CL (ref 4.0–10.5)

## 2013-02-18 NOTE — Progress Notes (Signed)
CRITICAL VALUE ALERT  Critical value received:  WBC of 1.4  Date of notification:  02/18/13  Time of notification:  1120  Critical value read back:yes  Nurse who received alert:  Tomasita Morrow RN  Forwarded this to [employer]. This is a patient of Dr. Greggory Stallion @ [facility]. We will fax labs.

## 2013-02-18 NOTE — Progress Notes (Signed)
Labs drawn today for cbc/diff,cmp,ldh,mg

## 2013-02-22 ENCOUNTER — Encounter (HOSPITAL_BASED_OUTPATIENT_CLINIC_OR_DEPARTMENT_OTHER): Payer: PRIVATE HEALTH INSURANCE

## 2013-02-22 DIAGNOSIS — C8119 Nodular sclerosis classical Hodgkin lymphoma, extranodal and solid organ sites: Secondary | ICD-10-CM

## 2013-02-22 LAB — DIFFERENTIAL
Basophils Relative: 1 % (ref 0–1)
Eosinophils Absolute: 0 10*3/uL (ref 0.0–0.7)
Eosinophils Relative: 0 % (ref 0–5)
Monocytes Absolute: 1 10*3/uL (ref 0.1–1.0)
Monocytes Relative: 24 % — ABNORMAL HIGH (ref 3–12)
Neutrophils Relative %: 34 % — ABNORMAL LOW (ref 43–77)

## 2013-02-22 LAB — BASIC METABOLIC PANEL
BUN: 10 mg/dL (ref 6–23)
CO2: 28 mEq/L (ref 19–32)
GFR calc non Af Amer: >90 mL/min (ref >90)
Glucose, Bld: 99 mg/dL (ref 70–99)
Potassium: 3.4 mEq/L — ABNORMAL LOW (ref 3.5–5.1)

## 2013-02-22 LAB — CBC
HCT: 39 % (ref 39.0–52.0)
Hemoglobin: 12.9 g/dL — ABNORMAL LOW (ref 13.0–17.0)
MCH: 28.9 pg (ref 26.0–34.0)
MCHC: 33.1 g/dL (ref 30.0–36.0)
MCV: 87.2 fL (ref 78.0–100.0)

## 2013-02-22 NOTE — Progress Notes (Signed)
Labs drawn today for cbc/diff,bmp,mg 

## 2013-02-25 ENCOUNTER — Encounter (HOSPITAL_COMMUNITY): Payer: PRIVATE HEALTH INSURANCE | Attending: Oncology

## 2013-02-25 DIAGNOSIS — D696 Thrombocytopenia, unspecified: Secondary | ICD-10-CM | POA: Insufficient documentation

## 2013-02-25 DIAGNOSIS — C8119 Nodular sclerosis classical Hodgkin lymphoma, extranodal and solid organ sites: Secondary | ICD-10-CM

## 2013-02-25 DIAGNOSIS — C8190 Hodgkin lymphoma, unspecified, unspecified site: Secondary | ICD-10-CM | POA: Insufficient documentation

## 2013-02-25 LAB — DIFFERENTIAL
Basophils Absolute: 0 10*3/uL (ref 0.0–0.1)
Basophils Relative: 1 % (ref 0–1)
Eosinophils Relative: 0 % (ref 0–5)
Lymphocytes Relative: 44 % (ref 12–46)
Monocytes Absolute: 0.5 10*3/uL (ref 0.1–1.0)
Monocytes Relative: 11 % (ref 3–12)

## 2013-02-25 LAB — BASIC METABOLIC PANEL
BUN: 17 mg/dL (ref 6–23)
CO2: 28 mEq/L (ref 19–32)
Chloride: 104 mEq/L (ref 96–112)
Creatinine, Ser: 0.81 mg/dL (ref 0.50–1.35)
Potassium: 3.7 mEq/L (ref 3.5–5.1)

## 2013-02-25 LAB — CBC
HCT: 37.4 % — ABNORMAL LOW (ref 39.0–52.0)
Hemoglobin: 12.4 g/dL — ABNORMAL LOW (ref 13.0–17.0)
MCHC: 33.2 g/dL (ref 30.0–36.0)
MCV: 86.8 fL (ref 78.0–100.0)
RDW: 14.2 % (ref 11.5–15.5)

## 2013-02-25 NOTE — Progress Notes (Signed)
Labs drawn today for cbc/diff,bmp,mg 

## 2013-03-05 ENCOUNTER — Other Ambulatory Visit (HOSPITAL_COMMUNITY): Payer: Self-pay | Admitting: Oncology

## 2013-03-05 DIAGNOSIS — C8190 Hodgkin lymphoma, unspecified, unspecified site: Secondary | ICD-10-CM

## 2013-03-05 DIAGNOSIS — D696 Thrombocytopenia, unspecified: Secondary | ICD-10-CM

## 2013-03-08 ENCOUNTER — Encounter (HOSPITAL_BASED_OUTPATIENT_CLINIC_OR_DEPARTMENT_OTHER): Payer: PRIVATE HEALTH INSURANCE

## 2013-03-08 ENCOUNTER — Encounter (HOSPITAL_COMMUNITY): Payer: PRIVATE HEALTH INSURANCE

## 2013-03-08 DIAGNOSIS — C8190 Hodgkin lymphoma, unspecified, unspecified site: Secondary | ICD-10-CM

## 2013-03-08 DIAGNOSIS — D696 Thrombocytopenia, unspecified: Secondary | ICD-10-CM

## 2013-03-08 DIAGNOSIS — Z5189 Encounter for other specified aftercare: Secondary | ICD-10-CM

## 2013-03-08 DIAGNOSIS — C8119 Nodular sclerosis classical Hodgkin lymphoma, extranodal and solid organ sites: Secondary | ICD-10-CM

## 2013-03-08 LAB — BASIC METABOLIC PANEL
BUN: 18 mg/dL (ref 6–23)
CO2: 26 mEq/L (ref 19–32)
Calcium: 9.6 mg/dL (ref 8.4–10.5)
Creatinine, Ser: 0.8 mg/dL (ref 0.50–1.35)
GFR calc non Af Amer: >90 mL/min (ref >90)
Glucose, Bld: 117 mg/dL — ABNORMAL HIGH (ref 70–99)
Sodium: 135 mEq/L (ref 135–145)

## 2013-03-08 LAB — CBC WITH DIFFERENTIAL/PLATELET
Eosinophils Absolute: 0 10*3/uL (ref 0.0–0.7)
Eosinophils Relative: 1 % (ref 0–5)
HCT: 40 % (ref 39.0–52.0)
Lymphocytes Relative: 27 % (ref 12–46)
Lymphs Abs: 1 10*3/uL (ref 0.7–4.0)
MCH: 29.1 pg (ref 26.0–34.0)
MCV: 85.7 fL (ref 78.0–100.0)
Monocytes Absolute: 0.1 10*3/uL (ref 0.1–1.0)
Platelets: 359 10*3/uL (ref 150–400)
RBC: 4.67 MIL/uL (ref 4.22–5.81)
WBC: 3.9 10*3/uL — ABNORMAL LOW (ref 4.0–10.5)

## 2013-03-08 MED ORDER — PEGFILGRASTIM INJECTION 6 MG/0.6ML
SUBCUTANEOUS | Status: AC
  Filled 2013-03-08: qty 0.6

## 2013-03-08 MED ORDER — PEGFILGRASTIM INJECTION 6 MG/0.6ML
6.0000 mg | Freq: Once | SUBCUTANEOUS | Status: AC
  Administered 2013-03-08: 6 mg via SUBCUTANEOUS

## 2013-03-08 NOTE — Progress Notes (Signed)
Samuel [name] Slauson's reason for visit today are for labs as scheduled per MD orders.  Venipuncture performed with a 23 gauge butterfly needle to R Antecubital.  Samuel [name] Statzer tolerated venipuncture well and without incident; questions were answered and patient was discharged.

## 2013-03-08 NOTE — Progress Notes (Signed)
Samuel [name] presents today for injection per the provider's orders.  Neulasta administered administration without incident; see MAR for injection details.  Patient tolerated procedure well and without incident.  No questions or complaints noted at this time.

## 2013-03-11 ENCOUNTER — Encounter (HOSPITAL_COMMUNITY): Payer: PRIVATE HEALTH INSURANCE

## 2013-03-11 DIAGNOSIS — C8190 Hodgkin lymphoma, unspecified, unspecified site: Secondary | ICD-10-CM

## 2013-03-11 DIAGNOSIS — D696 Thrombocytopenia, unspecified: Secondary | ICD-10-CM

## 2013-03-11 LAB — CBC WITH DIFFERENTIAL/PLATELET
Basophils Absolute: 0 10*3/uL (ref 0.0–0.1)
Basophils Relative: 1 % (ref 0–1)
Eosinophils Absolute: 0 10*3/uL (ref 0.0–0.7)
Eosinophils Relative: 0 % (ref 0–5)
HCT: 36.4 % — ABNORMAL LOW (ref 39.0–52.0)
Lymphocytes Relative: 45 % (ref 12–46)
MCH: 28.4 pg (ref 26.0–34.0)
MCHC: 33 g/dL (ref 30.0–36.0)
MCV: 86.1 fL (ref 78.0–100.0)
Monocytes Absolute: 0.1 10*3/uL (ref 0.1–1.0)
Platelets: 181 10*3/uL (ref 150–400)
RDW: 14.6 % (ref 11.5–15.5)
WBC: 1.7 10*3/uL — ABNORMAL LOW (ref 4.0–10.5)

## 2013-03-11 LAB — BASIC METABOLIC PANEL
CO2: 28 mEq/L (ref 19–32)
Calcium: 9.1 mg/dL (ref 8.4–10.5)
Creatinine, Ser: 0.75 mg/dL (ref 0.50–1.35)
GFR calc non Af Amer: >90 mL/min (ref >90)
Sodium: 137 mEq/L (ref 135–145)

## 2013-03-15 ENCOUNTER — Encounter (HOSPITAL_COMMUNITY): Payer: PRIVATE HEALTH INSURANCE

## 2013-03-15 DIAGNOSIS — D696 Thrombocytopenia, unspecified: Secondary | ICD-10-CM

## 2013-03-15 DIAGNOSIS — C8190 Hodgkin lymphoma, unspecified, unspecified site: Secondary | ICD-10-CM

## 2013-03-15 LAB — CBC WITH DIFFERENTIAL/PLATELET
Eosinophils Absolute: 0 10*3/uL (ref 0.0–0.7)
Eosinophils Relative: 0 % (ref 0–5)
HCT: 36.2 % — ABNORMAL LOW (ref 39.0–52.0)
Hemoglobin: 12.2 g/dL — ABNORMAL LOW (ref 13.0–17.0)
Lymphs Abs: 1.5 10*3/uL (ref 0.7–4.0)
MCH: 29.1 pg (ref 26.0–34.0)
MCV: 86.4 fL (ref 78.0–100.0)
Monocytes Absolute: 1.2 10*3/uL — ABNORMAL HIGH (ref 0.1–1.0)
Monocytes Relative: 23 % — ABNORMAL HIGH (ref 3–12)
Platelets: 59 10*3/uL — ABNORMAL LOW (ref 150–400)
RBC: 4.19 MIL/uL — ABNORMAL LOW (ref 4.22–5.81)

## 2013-03-15 LAB — BASIC METABOLIC PANEL
CO2: 28 mEq/L (ref 19–32)
Calcium: 9.4 mg/dL (ref 8.4–10.5)
Chloride: 102 mEq/L (ref 96–112)
Creatinine, Ser: 0.87 mg/dL (ref 0.50–1.35)
Glucose, Bld: 102 mg/dL — ABNORMAL HIGH (ref 70–99)

## 2013-03-29 ENCOUNTER — Other Ambulatory Visit (HOSPITAL_COMMUNITY): Payer: PRIVATE HEALTH INSURANCE

## 2013-03-29 ENCOUNTER — Encounter (HOSPITAL_BASED_OUTPATIENT_CLINIC_OR_DEPARTMENT_OTHER): Payer: PRIVATE HEALTH INSURANCE

## 2013-03-29 ENCOUNTER — Encounter (HOSPITAL_COMMUNITY): Payer: PRIVATE HEALTH INSURANCE | Attending: Oncology

## 2013-03-29 VITALS — BP 105/77 | HR 114 | Temp 98.5°F | Resp 16

## 2013-03-29 DIAGNOSIS — C8119 Nodular sclerosis classical Hodgkin lymphoma, extranodal and solid organ sites: Secondary | ICD-10-CM

## 2013-03-29 DIAGNOSIS — C8190 Hodgkin lymphoma, unspecified, unspecified site: Secondary | ICD-10-CM

## 2013-03-29 LAB — CBC
HCT: 36.3 % — ABNORMAL LOW (ref 39.0–52.0)
MCH: 29.1 pg (ref 26.0–34.0)
MCV: 85.2 fL (ref 78.0–100.0)
Platelets: 250 10*3/uL (ref 150–400)
RBC: 4.26 MIL/uL (ref 4.22–5.81)
RDW: 16.1 % — ABNORMAL HIGH (ref 11.5–15.5)

## 2013-03-29 LAB — MAGNESIUM: Magnesium: 2.1 mg/dL (ref 1.5–2.5)

## 2013-03-29 LAB — DIFFERENTIAL
Eosinophils Absolute: 0 10*3/uL (ref 0.0–0.7)
Eosinophils Relative: 0 % (ref 0–5)
Lymphocytes Relative: 19 % (ref 12–46)
Lymphs Abs: 1.1 10*3/uL (ref 0.7–4.0)
Monocytes Absolute: 0.1 10*3/uL (ref 0.1–1.0)

## 2013-03-29 LAB — BASIC METABOLIC PANEL
CO2: 25 mEq/L (ref 19–32)
Calcium: 9.3 mg/dL (ref 8.4–10.5)
Chloride: 100 mEq/L (ref 96–112)
Creatinine, Ser: 0.94 mg/dL (ref 0.50–1.35)
Glucose, Bld: 128 mg/dL — ABNORMAL HIGH (ref 70–99)

## 2013-03-29 MED ORDER — PEGFILGRASTIM INJECTION 6 MG/0.6ML
6.0000 mg | Freq: Once | SUBCUTANEOUS | Status: AC
  Administered 2013-03-29: 6 mg via SUBCUTANEOUS

## 2013-03-29 MED ORDER — PEGFILGRASTIM INJECTION 6 MG/0.6ML
SUBCUTANEOUS | Status: AC
  Filled 2013-03-29: qty 0.6

## 2013-03-29 NOTE — Progress Notes (Signed)
Samuel [name] presents today for injection per MD orders. Neulasta 6mg  administered SQ in left Abdomen. Administration without incident. Patient tolerated well.

## 2013-03-29 NOTE — Progress Notes (Signed)
Labs drawn today for cbc/diff,bmp,mg 

## 2013-04-01 ENCOUNTER — Encounter (HOSPITAL_BASED_OUTPATIENT_CLINIC_OR_DEPARTMENT_OTHER): Payer: PRIVATE HEALTH INSURANCE

## 2013-04-01 DIAGNOSIS — C8119 Nodular sclerosis classical Hodgkin lymphoma, extranodal and solid organ sites: Secondary | ICD-10-CM

## 2013-04-01 LAB — CBC
HCT: 30.5 % — ABNORMAL LOW (ref 39.0–52.0)
MCV: 85.9 fL (ref 78.0–100.0)
Platelets: 126 10*3/uL — ABNORMAL LOW (ref 150–400)
RBC: 3.55 MIL/uL — ABNORMAL LOW (ref 4.22–5.81)
RDW: 16 % — ABNORMAL HIGH (ref 11.5–15.5)
WBC: 2.2 10*3/uL — ABNORMAL LOW (ref 4.0–10.5)

## 2013-04-01 LAB — DIFFERENTIAL
Basophils Absolute: 0 10*3/uL (ref 0.0–0.1)
Eosinophils Relative: 0 % (ref 0–5)
Lymphocytes Relative: 31 % (ref 12–46)
Lymphs Abs: 0.7 10*3/uL (ref 0.7–4.0)
Neutro Abs: 1.4 10*3/uL — ABNORMAL LOW (ref 1.7–7.7)

## 2013-04-01 LAB — BASIC METABOLIC PANEL
BUN: 18 mg/dL (ref 6–23)
CO2: 24 mEq/L (ref 19–32)
Calcium: 8.8 mg/dL (ref 8.4–10.5)
Chloride: 104 mEq/L (ref 96–112)
Creatinine, Ser: 0.76 mg/dL (ref 0.50–1.35)

## 2013-04-01 LAB — MAGNESIUM: Magnesium: 1.8 mg/dL (ref 1.5–2.5)

## 2013-04-01 NOTE — Progress Notes (Signed)
Labs drawn today for cbc/diff,bmp,mg 

## 2013-04-02 ENCOUNTER — Telehealth: Payer: Self-pay | Admitting: Medical Oncology

## 2013-04-02 NOTE — Telephone Encounter (Signed)
Faxed record to [facility] that came to [facility].

## 2013-04-05 ENCOUNTER — Encounter (HOSPITAL_BASED_OUTPATIENT_CLINIC_OR_DEPARTMENT_OTHER): Payer: PRIVATE HEALTH INSURANCE

## 2013-04-05 DIAGNOSIS — C8119 Nodular sclerosis classical Hodgkin lymphoma, extranodal and solid organ sites: Secondary | ICD-10-CM

## 2013-04-05 LAB — DIFFERENTIAL
Basophils Absolute: 0 10*3/uL (ref 0.0–0.1)
Eosinophils Relative: 0 % (ref 0–5)
Lymphocytes Relative: 32 % (ref 12–46)
Lymphs Abs: 1.3 10*3/uL (ref 0.7–4.0)
Monocytes Absolute: 1.3 10*3/uL — ABNORMAL HIGH (ref 0.1–1.0)
Monocytes Relative: 33 % — ABNORMAL HIGH (ref 3–12)
Neutro Abs: 1.4 10*3/uL — ABNORMAL LOW (ref 1.7–7.7)

## 2013-04-05 LAB — MAGNESIUM: Magnesium: 1.7 mg/dL (ref 1.5–2.5)

## 2013-04-05 LAB — CBC
HCT: 33.3 % — ABNORMAL LOW (ref 39.0–52.0)
Hemoglobin: 11.2 g/dL — ABNORMAL LOW (ref 13.0–17.0)
MCV: 86.5 fL (ref 78.0–100.0)
RBC: 3.85 MIL/uL — ABNORMAL LOW (ref 4.22–5.81)
WBC: 4 10*3/uL (ref 4.0–10.5)

## 2013-04-05 LAB — BASIC METABOLIC PANEL
CO2: 26 mEq/L (ref 19–32)
Chloride: 103 mEq/L (ref 96–112)
Creatinine, Ser: 0.92 mg/dL (ref 0.50–1.35)
GFR calc Af Amer: >90 mL/min (ref >90)
Potassium: 4.2 mEq/L (ref 3.5–5.1)

## 2013-04-05 NOTE — Progress Notes (Signed)
Labs drawn today for cbc/diff,cmp,mg 

## 2013-04-06 LAB — PATHOLOGIST SMEAR REVIEW

## 2013-04-08 ENCOUNTER — Encounter (HOSPITAL_BASED_OUTPATIENT_CLINIC_OR_DEPARTMENT_OTHER): Payer: PRIVATE HEALTH INSURANCE

## 2013-04-08 DIAGNOSIS — C8119 Nodular sclerosis classical Hodgkin lymphoma, extranodal and solid organ sites: Secondary | ICD-10-CM

## 2013-04-08 LAB — BASIC METABOLIC PANEL
CO2: 26 mEq/L (ref 19–32)
Chloride: 101 mEq/L (ref 96–112)
Creatinine, Ser: 0.98 mg/dL (ref 0.50–1.35)
GFR calc Af Amer: >90 mL/min (ref >90)
Potassium: 4.3 mEq/L (ref 3.5–5.1)
Sodium: 137 mEq/L (ref 135–145)

## 2013-04-08 LAB — CBC
HCT: 34.3 % — ABNORMAL LOW (ref 39.0–52.0)
MCV: 86.4 fL (ref 78.0–100.0)
RBC: 3.97 MIL/uL — ABNORMAL LOW (ref 4.22–5.81)
RDW: 16.9 % — ABNORMAL HIGH (ref 11.5–15.5)
WBC: 8.7 10*3/uL (ref 4.0–10.5)

## 2013-04-08 LAB — DIFFERENTIAL
Basophils Absolute: 0 10*3/uL (ref 0.0–0.1)
Lymphocytes Relative: 22 % (ref 12–46)
Lymphs Abs: 1.9 10*3/uL (ref 0.7–4.0)
Monocytes Absolute: 0.9 10*3/uL (ref 0.1–1.0)
Neutro Abs: 5.9 10*3/uL (ref 1.7–7.7)
WBC Morphology: INCREASED

## 2013-04-08 NOTE — Progress Notes (Signed)
Labs drawn today for cbc/diff,bmp,mg 

## 2013-04-12 ENCOUNTER — Encounter (HOSPITAL_BASED_OUTPATIENT_CLINIC_OR_DEPARTMENT_OTHER): Payer: PRIVATE HEALTH INSURANCE

## 2013-04-12 ENCOUNTER — Ambulatory Visit (HOSPITAL_COMMUNITY): Payer: PRIVATE HEALTH INSURANCE

## 2013-04-12 DIAGNOSIS — C8119 Nodular sclerosis classical Hodgkin lymphoma, extranodal and solid organ sites: Secondary | ICD-10-CM

## 2013-04-12 LAB — DIFFERENTIAL
Basophils Absolute: 0 10*3/uL (ref 0.0–0.1)
Basophils Relative: 1 % (ref 0–1)
Eosinophils Relative: 0 % (ref 0–5)
Monocytes Absolute: 0.6 10*3/uL (ref 0.1–1.0)

## 2013-04-12 LAB — CBC
HCT: 34.3 % — ABNORMAL LOW (ref 39.0–52.0)
MCHC: 33.8 g/dL (ref 30.0–36.0)
MCV: 86.4 fL (ref 78.0–100.0)
RDW: 17.7 % — ABNORMAL HIGH (ref 11.5–15.5)

## 2013-04-12 LAB — BASIC METABOLIC PANEL
BUN: 13 mg/dL (ref 6–23)
CO2: 27 mEq/L (ref 19–32)
Chloride: 102 mEq/L (ref 96–112)
Creatinine, Ser: 0.96 mg/dL (ref 0.50–1.35)

## 2013-04-12 NOTE — Progress Notes (Signed)
Labs drawn today for cbc/diff,bmp,mg 

## 2013-04-22 ENCOUNTER — Telehealth (HOSPITAL_COMMUNITY): Payer: Self-pay | Admitting: Oncology

## 2013-04-22 NOTE — Telephone Encounter (Signed)
04/22/13 0930  Samuel [name] called the clinic stating he was told to follow-up with [place] for [place] flushes.  I contacted Dr. Roanna Banning office at that time to request orders for Samuel [name] needs done, and I spoke with Samuel Asp, RN.  Contacted Samuel Bailiff, RN to arrange [facility] - she is awaiting word from me on [facility]'s orders.  04/22/13  1400  Spoke with Samuel [name] - [facility] RN again - awaiting clarification on whether patient's care needs to be coordinated - awaiting orders.  04/22/13 3:54 PM Faxed order received from Samuel [name], Transplant Coordinator.  Samuel [name] w/ [facility] faxed orders and will coordinate with patient.  Samuel [name] aware.

## 2013-05-17 ENCOUNTER — Other Ambulatory Visit (HOSPITAL_COMMUNITY): Payer: Self-pay | Admitting: *Deleted

## 2013-05-17 DIAGNOSIS — C8190 Hodgkin lymphoma, unspecified, unspecified site: Secondary | ICD-10-CM

## 2013-05-19 ENCOUNTER — Other Ambulatory Visit (HOSPITAL_COMMUNITY): Payer: Self-pay | Admitting: Oncology

## 2013-05-20 ENCOUNTER — Encounter (HOSPITAL_COMMUNITY): Payer: PRIVATE HEALTH INSURANCE | Attending: Oncology

## 2013-05-20 DIAGNOSIS — C8190 Hodgkin lymphoma, unspecified, unspecified site: Secondary | ICD-10-CM | POA: Insufficient documentation

## 2013-05-20 LAB — BASIC METABOLIC PANEL
Calcium: 9.9 mg/dL (ref 8.4–10.5)
Creatinine, Ser: 0.79 mg/dL (ref 0.50–1.35)
GFR calc non Af Amer: >90 mL/min (ref >90)
Glucose, Bld: 107 mg/dL — ABNORMAL HIGH (ref 70–99)
Sodium: 139 mEq/L (ref 135–145)

## 2013-05-20 LAB — CBC WITH DIFFERENTIAL/PLATELET
Basophils Relative: 0 % (ref 0–1)
Eosinophils Absolute: 0 10*3/uL (ref 0.0–0.7)
Eosinophils Relative: 0 % (ref 0–5)
Hemoglobin: 11.3 g/dL — ABNORMAL LOW (ref 13.0–17.0)
MCH: 29.6 pg (ref 26.0–34.0)
MCHC: 34 g/dL (ref 30.0–36.0)
Monocytes Relative: 20 % — ABNORMAL HIGH (ref 3–12)
Neutrophils Relative %: 53 % (ref 43–77)
Platelets: 51 10*3/uL — ABNORMAL LOW (ref 150–400)

## 2013-05-20 NOTE — Progress Notes (Signed)
Labs drawn today for cbc/diff,bmp,mg 

## 2013-05-24 ENCOUNTER — Encounter (HOSPITAL_BASED_OUTPATIENT_CLINIC_OR_DEPARTMENT_OTHER): Payer: PRIVATE HEALTH INSURANCE

## 2013-05-24 DIAGNOSIS — C8190 Hodgkin lymphoma, unspecified, unspecified site: Secondary | ICD-10-CM

## 2013-05-24 LAB — BASIC METABOLIC PANEL
BUN: 9 mg/dL (ref 6–23)
Calcium: 9.8 mg/dL (ref 8.4–10.5)
Chloride: 101 mEq/L (ref 96–112)
GFR calc non Af Amer: >90 mL/min (ref >90)
Glucose, Bld: 156 mg/dL — ABNORMAL HIGH (ref 70–99)
Sodium: 137 mEq/L (ref 135–145)

## 2013-05-24 LAB — CBC WITH DIFFERENTIAL/PLATELET
Eosinophils Absolute: 0 10*3/uL (ref 0.0–0.7)
Eosinophils Relative: 1 % (ref 0–5)
Hemoglobin: 12.1 g/dL — ABNORMAL LOW (ref 13.0–17.0)
Lymphs Abs: 0.8 10*3/uL (ref 0.7–4.0)
MCH: 30.1 pg (ref 26.0–34.0)
MCV: 88.1 fL (ref 78.0–100.0)
Monocytes Absolute: 0.5 10*3/uL (ref 0.1–1.0)
Monocytes Relative: 23 % — ABNORMAL HIGH (ref 3–12)
RBC: 4.02 MIL/uL — ABNORMAL LOW (ref 4.22–5.81)

## 2013-05-24 NOTE — Progress Notes (Signed)
Labs drawn today for cbc/diff,bmp,mg 

## 2013-05-28 ENCOUNTER — Encounter (HOSPITAL_COMMUNITY): Payer: PRIVATE HEALTH INSURANCE | Attending: Oncology

## 2013-05-28 DIAGNOSIS — C8119 Nodular sclerosis classical Hodgkin lymphoma, extranodal and solid organ sites: Secondary | ICD-10-CM

## 2013-05-28 DIAGNOSIS — C8190 Hodgkin lymphoma, unspecified, unspecified site: Secondary | ICD-10-CM | POA: Insufficient documentation

## 2013-05-28 LAB — CBC WITH DIFFERENTIAL/PLATELET
Eosinophils Absolute: 0.1 10*3/uL (ref 0.0–0.7)
Eosinophils Relative: 3 % (ref 0–5)
HCT: 36.4 % — ABNORMAL LOW (ref 39.0–52.0)
Hemoglobin: 12.2 g/dL — ABNORMAL LOW (ref 13.0–17.0)
Lymphs Abs: 1.2 10*3/uL (ref 0.7–4.0)
MCH: 30.2 pg (ref 26.0–34.0)
MCV: 90.1 fL (ref 78.0–100.0)
Neutro Abs: 0.7 10*3/uL — ABNORMAL LOW (ref 1.7–7.7)
Platelets: 76 10*3/uL — ABNORMAL LOW (ref 150–400)
RBC: 4.04 MIL/uL — ABNORMAL LOW (ref 4.22–5.81)
RDW: 17.3 % — ABNORMAL HIGH (ref 11.5–15.5)
WBC: 2.5 10*3/uL — ABNORMAL LOW (ref 4.0–10.5)

## 2013-05-28 LAB — BASIC METABOLIC PANEL
BUN: 11 mg/dL (ref 6–23)
Calcium: 9.8 mg/dL (ref 8.4–10.5)
Creatinine, Ser: 0.85 mg/dL (ref 0.50–1.35)
GFR calc Af Amer: >90 mL/min (ref >90)
GFR calc non Af Amer: >90 mL/min (ref >90)
Glucose, Bld: 104 mg/dL — ABNORMAL HIGH (ref 70–99)
Sodium: 141 mEq/L (ref 135–145)

## 2013-05-28 NOTE — Progress Notes (Signed)
Labs drawn today for cbc/diff,bmp,mg 

## 2013-05-31 ENCOUNTER — Encounter (HOSPITAL_BASED_OUTPATIENT_CLINIC_OR_DEPARTMENT_OTHER): Payer: PRIVATE HEALTH INSURANCE

## 2013-05-31 DIAGNOSIS — C8190 Hodgkin lymphoma, unspecified, unspecified site: Secondary | ICD-10-CM

## 2013-05-31 LAB — BASIC METABOLIC PANEL
CO2: 28 mEq/L (ref 19–32)
Calcium: 9.7 mg/dL (ref 8.4–10.5)
Creatinine, Ser: 0.85 mg/dL (ref 0.50–1.35)
GFR calc non Af Amer: >90 mL/min (ref >90)
Potassium: 3.8 mEq/L (ref 3.5–5.1)
Sodium: 141 mEq/L (ref 135–145)

## 2013-05-31 LAB — CBC WITH DIFFERENTIAL/PLATELET
Basophils Absolute: 0 10*3/uL (ref 0.0–0.1)
Basophils Relative: 1 % (ref 0–1)
Eosinophils Absolute: 0.2 10*3/uL (ref 0.0–0.7)
Eosinophils Relative: 4 % (ref 0–5)
Lymphocytes Relative: 50 % — ABNORMAL HIGH (ref 12–46)
Lymphs Abs: 1.9 10*3/uL (ref 0.7–4.0)
MCH: 30.9 pg (ref 26.0–34.0)
MCHC: 33.8 g/dL (ref 30.0–36.0)
MCV: 91.4 fL (ref 78.0–100.0)
Platelets: 78 10*3/uL — ABNORMAL LOW (ref 150–400)
RBC: 4.08 MIL/uL — ABNORMAL LOW (ref 4.22–5.81)
RDW: 17.9 % — ABNORMAL HIGH (ref 11.5–15.5)
WBC: 3.7 10*3/uL — ABNORMAL LOW (ref 4.0–10.5)

## 2013-05-31 LAB — MAGNESIUM: Magnesium: 1.8 mg/dL (ref 1.5–2.5)

## 2013-05-31 NOTE — Progress Notes (Signed)
Labs drawn today for cbc/diff,bmp,mg 

## 2013-06-03 ENCOUNTER — Encounter (HOSPITAL_BASED_OUTPATIENT_CLINIC_OR_DEPARTMENT_OTHER): Payer: PRIVATE HEALTH INSURANCE

## 2013-06-03 DIAGNOSIS — C8190 Hodgkin lymphoma, unspecified, unspecified site: Secondary | ICD-10-CM

## 2013-06-03 DIAGNOSIS — C8119 Nodular sclerosis classical Hodgkin lymphoma, extranodal and solid organ sites: Secondary | ICD-10-CM

## 2013-06-03 LAB — CBC WITH DIFFERENTIAL/PLATELET
Basophils Absolute: 0 10*3/uL (ref 0.0–0.1)
Basophils Relative: 1 % (ref 0–1)
Eosinophils Absolute: 0.1 10*3/uL (ref 0.0–0.7)
Eosinophils Relative: 3 % (ref 0–5)
Lymphocytes Relative: 59 % — ABNORMAL HIGH (ref 12–46)
MCHC: 33.4 g/dL (ref 30.0–36.0)
Monocytes Relative: 15 % — ABNORMAL HIGH (ref 3–12)
Neutro Abs: 0.8 10*3/uL — ABNORMAL LOW (ref 1.7–7.7)
Platelets: 88 10*3/uL — ABNORMAL LOW (ref 150–400)
RBC: 3.99 MIL/uL — ABNORMAL LOW (ref 4.22–5.81)
RDW: 17.7 % — ABNORMAL HIGH (ref 11.5–15.5)
WBC: 3.6 10*3/uL — ABNORMAL LOW (ref 4.0–10.5)

## 2013-06-03 LAB — BASIC METABOLIC PANEL
BUN: 11 mg/dL (ref 6–23)
CO2: 28 mEq/L (ref 19–32)
Calcium: 9.6 mg/dL (ref 8.4–10.5)
Chloride: 102 mEq/L (ref 96–112)
Creatinine, Ser: 0.84 mg/dL (ref 0.50–1.35)
GFR calc Af Amer: >90 mL/min (ref >90)
Sodium: 139 mEq/L (ref 135–145)

## 2013-06-03 NOTE — Progress Notes (Signed)
Labs drawn today for cbc/diff,bmp,mg

## 2013-06-07 ENCOUNTER — Other Ambulatory Visit (HOSPITAL_COMMUNITY): Payer: PRIVATE HEALTH INSURANCE

## 2013-06-07 ENCOUNTER — Ambulatory Visit (HOSPITAL_COMMUNITY): Payer: PRIVATE HEALTH INSURANCE

## 2013-06-10 ENCOUNTER — Other Ambulatory Visit (HOSPITAL_COMMUNITY): Payer: PRIVATE HEALTH INSURANCE

## 2013-06-14 ENCOUNTER — Other Ambulatory Visit (HOSPITAL_COMMUNITY): Payer: PRIVATE HEALTH INSURANCE

## 2013-06-17 ENCOUNTER — Other Ambulatory Visit (HOSPITAL_COMMUNITY): Payer: PRIVATE HEALTH INSURANCE

## 2013-06-18 ENCOUNTER — Telehealth (HOSPITAL_COMMUNITY): Payer: Self-pay | Admitting: *Deleted

## 2013-06-18 NOTE — Telephone Encounter (Signed)
Samuel [name] @ Dr. Hezzie Bump office stated that patient does not have to have any more labs drawn @ [facility]. Patient to see Dr. Greggory Stallion in December as scheduled.

## 2013-06-21 ENCOUNTER — Other Ambulatory Visit (HOSPITAL_COMMUNITY): Payer: PRIVATE HEALTH INSURANCE

## 2013-06-24 ENCOUNTER — Other Ambulatory Visit (HOSPITAL_COMMUNITY): Payer: PRIVATE HEALTH INSURANCE

## 2013-06-28 ENCOUNTER — Other Ambulatory Visit (HOSPITAL_COMMUNITY): Payer: PRIVATE HEALTH INSURANCE

## 2013-07-05 ENCOUNTER — Other Ambulatory Visit (HOSPITAL_COMMUNITY): Payer: PRIVATE HEALTH INSURANCE

## 2013-07-12 ENCOUNTER — Other Ambulatory Visit (HOSPITAL_COMMUNITY): Payer: PRIVATE HEALTH INSURANCE

## 2013-07-19 ENCOUNTER — Other Ambulatory Visit (HOSPITAL_COMMUNITY): Payer: PRIVATE HEALTH INSURANCE

## 2013-07-19 ENCOUNTER — Encounter (HOSPITAL_COMMUNITY): Payer: PRIVATE HEALTH INSURANCE

## 2013-07-26 ENCOUNTER — Other Ambulatory Visit (HOSPITAL_COMMUNITY): Payer: PRIVATE HEALTH INSURANCE

## 2013-08-24 ENCOUNTER — Encounter (HOSPITAL_COMMUNITY): Payer: PRIVATE HEALTH INSURANCE

## 2013-08-30 ENCOUNTER — Encounter (HOSPITAL_COMMUNITY): Payer: PRIVATE HEALTH INSURANCE | Attending: Oncology

## 2013-08-30 ENCOUNTER — Encounter (HOSPITAL_COMMUNITY): Payer: PRIVATE HEALTH INSURANCE

## 2013-08-30 DIAGNOSIS — C8119 Nodular sclerosis classical Hodgkin lymphoma, extranodal and solid organ sites: Secondary | ICD-10-CM

## 2013-08-30 DIAGNOSIS — C8190 Hodgkin lymphoma, unspecified, unspecified site: Secondary | ICD-10-CM | POA: Insufficient documentation

## 2013-08-30 DIAGNOSIS — Z452 Encounter for adjustment and management of vascular access device: Secondary | ICD-10-CM

## 2013-08-30 MED ORDER — HEPARIN SOD (PORK) LOCK FLUSH 100 UNIT/ML IV SOLN
INTRAVENOUS | Status: AC
Start: 2013-08-30 — End: 2013-08-30
  Filled 2013-08-30: qty 5

## 2013-08-30 MED ORDER — HEPARIN SOD (PORK) LOCK FLUSH 100 UNIT/ML IV SOLN
500.0000 [IU] | Freq: Once | INTRAVENOUS | Status: AC
  Administered 2013-08-30: 500 [IU] via INTRAVENOUS

## 2013-08-30 MED ORDER — SODIUM CHLORIDE 0.9 % IJ SOLN
10.0000 mL | Freq: Once | INTRAMUSCULAR | Status: AC
  Administered 2013-08-30: 10 mL via INTRAVENOUS

## 2013-08-30 NOTE — Progress Notes (Signed)
Steve Rattler presented for Portacath access and flush. Proper placement of portacath confirmed by CXR. Portacath located rt chest wall accessed with  H 20 needle. Good blood return present. Portacath flushed with 66ml NS and 500U/72ml Heparin and needle removed intact. Procedure without incident. Patient tolerated procedure well.

## 2013-09-13 ENCOUNTER — Encounter (HOSPITAL_COMMUNITY): Payer: PRIVATE HEALTH INSURANCE

## 2013-10-12 ENCOUNTER — Encounter (HOSPITAL_COMMUNITY): Payer: PRIVATE HEALTH INSURANCE | Attending: Oncology

## 2013-10-12 DIAGNOSIS — Z9889 Other specified postprocedural states: Secondary | ICD-10-CM | POA: Insufficient documentation

## 2013-10-12 DIAGNOSIS — Z95828 Presence of other vascular implants and grafts: Secondary | ICD-10-CM

## 2013-10-12 DIAGNOSIS — C8190 Hodgkin lymphoma, unspecified, unspecified site: Secondary | ICD-10-CM

## 2013-10-12 DIAGNOSIS — Z452 Encounter for adjustment and management of vascular access device: Secondary | ICD-10-CM

## 2013-10-12 MED ORDER — SODIUM CHLORIDE 0.9 % IJ SOLN
10.0000 mL | INTRAMUSCULAR | Status: DC | PRN
  Administered 2013-10-12: 10 mL via INTRAVENOUS

## 2013-10-12 MED ORDER — HEPARIN SOD (PORK) LOCK FLUSH 100 UNIT/ML IV SOLN
500.0000 [IU] | Freq: Once | INTRAVENOUS | Status: AC
  Administered 2013-10-12: 500 [IU] via INTRAVENOUS

## 2013-10-12 MED ORDER — HEPARIN SOD (PORK) LOCK FLUSH 100 UNIT/ML IV SOLN
INTRAVENOUS | Status: AC
  Filled 2013-10-12: qty 5

## 2013-10-12 NOTE — Progress Notes (Signed)
[name] presented for Portacath access and flush. Proper placement of portacath confirmed by CXR. Portacath located left chest wall accessed with  H 20 needle. Good blood return present. Portacath flushed with 20ml NS and 500U/5ml Heparin and needle removed intact. Procedure without incident. Patient tolerated procedure well.   

## 2013-11-23 ENCOUNTER — Encounter (HOSPITAL_COMMUNITY): Payer: PRIVATE HEALTH INSURANCE

## 2014-03-08 ENCOUNTER — Encounter (HOSPITAL_COMMUNITY): Payer: PRIVATE HEALTH INSURANCE | Attending: Hematology and Oncology

## 2014-03-08 DIAGNOSIS — C8119 Nodular sclerosis classical Hodgkin lymphoma, extranodal and solid organ sites: Secondary | ICD-10-CM

## 2014-03-08 DIAGNOSIS — Z452 Encounter for adjustment and management of vascular access device: Secondary | ICD-10-CM

## 2014-03-08 DIAGNOSIS — Z95828 Presence of other vascular implants and grafts: Secondary | ICD-10-CM

## 2014-03-08 MED ORDER — HEPARIN SOD (PORK) LOCK FLUSH 100 UNIT/ML IV SOLN
500.0000 [IU] | Freq: Once | INTRAVENOUS | Status: AC
  Administered 2014-03-08: 500 [IU] via INTRAVENOUS

## 2014-03-08 MED ORDER — HEPARIN SOD (PORK) LOCK FLUSH 100 UNIT/ML IV SOLN
INTRAVENOUS | Status: AC
  Filled 2014-03-08: qty 5

## 2014-03-08 MED ORDER — SODIUM CHLORIDE 0.9 % IJ SOLN
10.0000 mL | Freq: Once | INTRAMUSCULAR | Status: AC
  Administered 2014-03-08: 10 mL via INTRAVENOUS

## 2014-03-08 NOTE — Progress Notes (Signed)
Samuel [name] presented for Portacath access and flush. Proper placement of portacath confirmed by CXR. Portacath located left chest wall accessed with  H 20 needle. Good blood return present. Portacath flushed with 60ml NS and 500U/27ml Heparin and needle removed intact. Procedure without incident. Patient tolerated procedure well.

## 2014-04-19 ENCOUNTER — Ambulatory Visit (HOSPITAL_COMMUNITY): Payer: PRIVATE HEALTH INSURANCE | Admitting: Oncology

## 2014-04-19 ENCOUNTER — Encounter (HOSPITAL_COMMUNITY): Payer: PRIVATE HEALTH INSURANCE

## 2014-04-20 ENCOUNTER — Encounter (HOSPITAL_COMMUNITY): Payer: PRIVATE HEALTH INSURANCE | Attending: Hematology and Oncology

## 2014-04-20 DIAGNOSIS — Z9889 Other specified postprocedural states: Secondary | ICD-10-CM | POA: Insufficient documentation

## 2014-04-20 DIAGNOSIS — Z95828 Presence of other vascular implants and grafts: Secondary | ICD-10-CM

## 2014-04-20 MED ORDER — SODIUM CHLORIDE 0.9 % IJ SOLN
10.0000 mL | INTRAMUSCULAR | Status: DC | PRN
  Administered 2014-04-20: 10 mL via INTRAVENOUS

## 2014-04-20 MED ORDER — HEPARIN SOD (PORK) LOCK FLUSH 100 UNIT/ML IV SOLN
500.0000 [IU] | Freq: Once | INTRAVENOUS | Status: AC
  Administered 2014-04-20: 500 [IU] via INTRAVENOUS
  Filled 2014-04-20: qty 5

## 2014-04-20 NOTE — Progress Notes (Signed)
Samuel [name] presented for Portacath access and flush. Portacath located lt chest wall accessed with  H 20 needle. Small amount blood return present. Portacath flushed with 74ml NS and 500U/81ml Heparin and needle removed intact. Procedure without incident. Patient tolerated procedure well.

## 2014-05-18 IMAGING — CR DG CHEST 2V
2 series · 2 of 2 positions shown · non-contrast
Comparison: 07/31/2010

CLINICAL DATA: Left neck mass

CHEST - 2 VIEW

[view not recorded (1 of 2)]
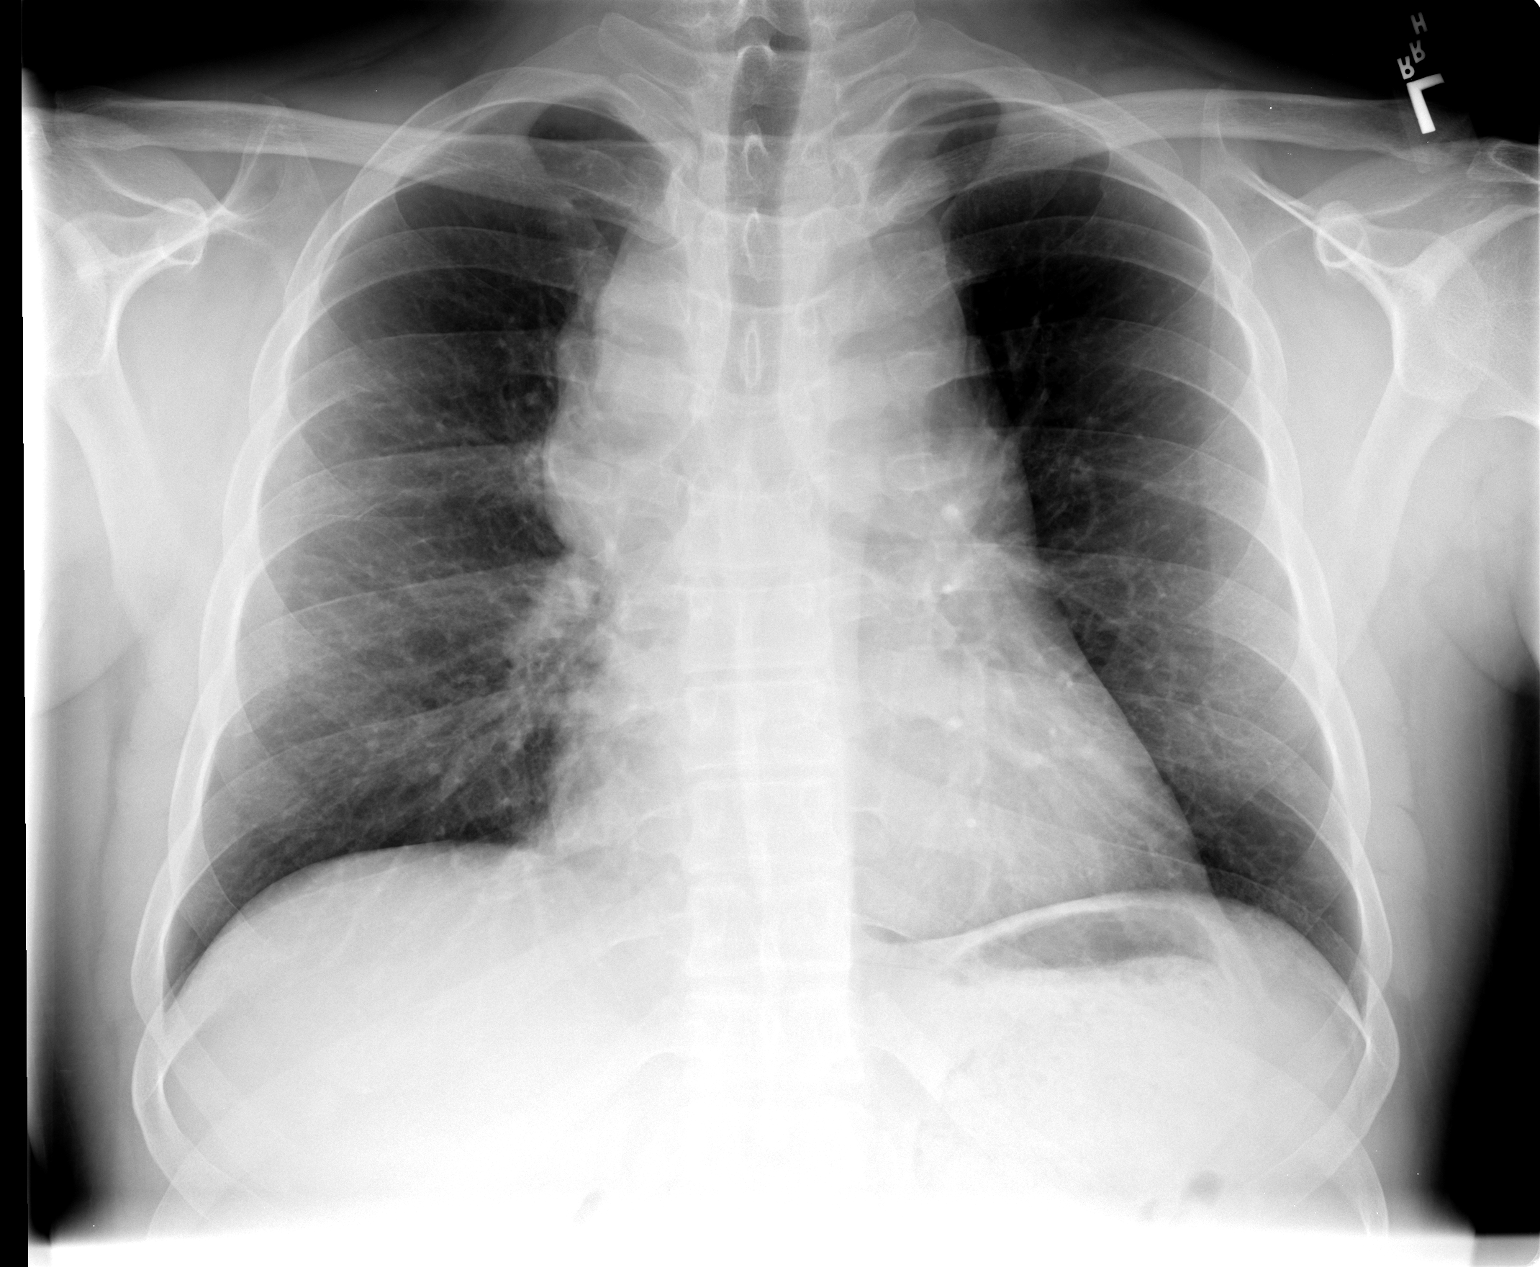

[view not recorded (2 of 2)]
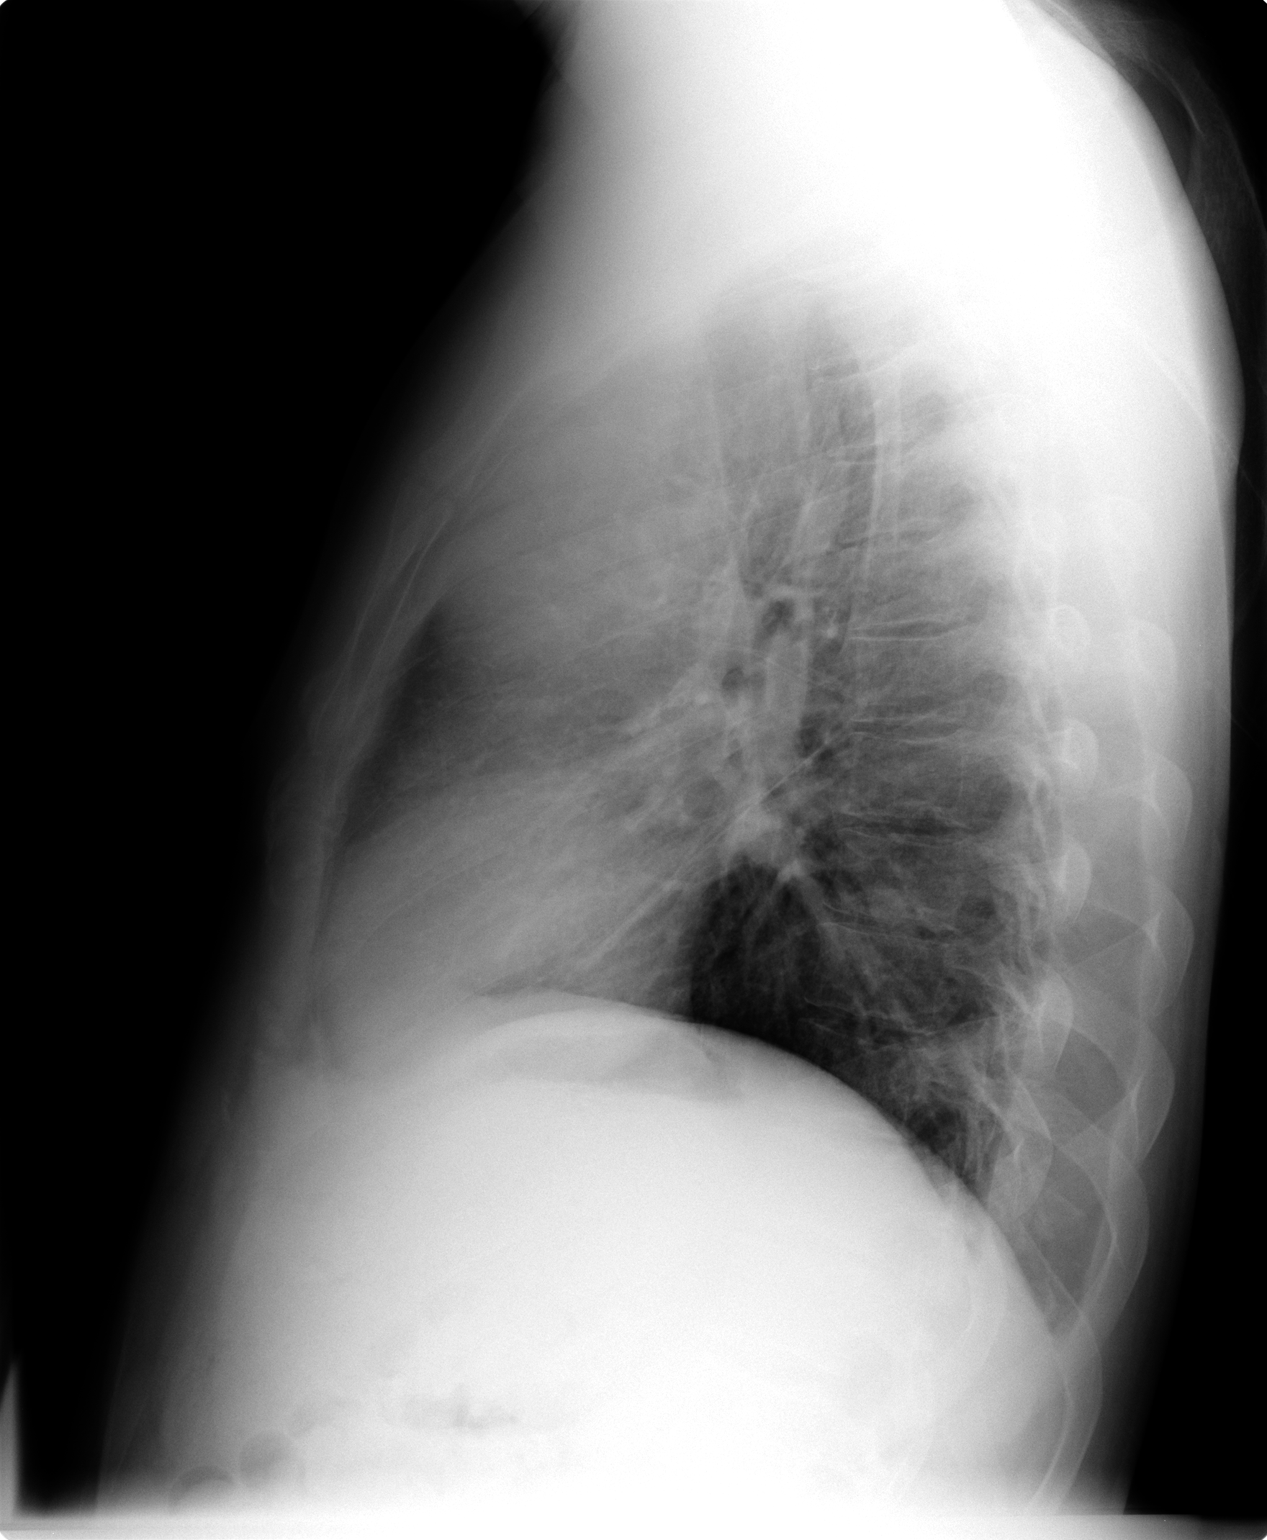

[2 of 2 positions shown; findings below may reference images not displayed]

FINDINGS: Bulky superior mediastinal adenopathy has developed since
the prior study.  There is right paratracheal and bilateral
anterior mediastinal adenopathy.

No peripheral lung mass is seen.  Negative for pneumonia or
effusion.  Heart size is normal.
IMPRESSION: Bulky anterior mediastinal and right paratracheal adenopathy.  This
was not present on the prior study.  This could represent lymphoma
or metastatic disease.

## 2014-05-18 IMAGING — CT CT NECK W/ CM
3 of 8 series · 7 of 33 positions shown, 8 images · IV contrast (Omnipaque 300)
Comparison: None.

CLINICAL DATA: Neck mass on the left

CT NECK WITH CONTRAST
TECHNIQUE: Multidetector CT imaging of the neck was performed with
intravenous contrast.
Contrast: 75mL OMNIPAQUE IOHEXOL 300 MG/ML  SOLN

[Series 2: soft tissue neck 2.0 b31s · axial · 0.35mm/px · z∈[+138,+138]mm · 1 of 122 slices shown, 2 images]
[im 73/122  soft-tissue]
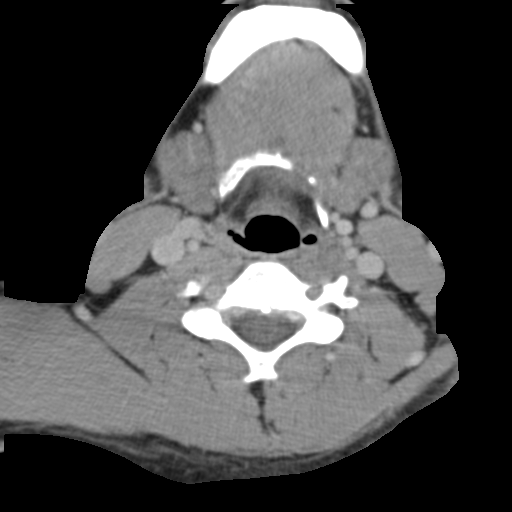
[im 73/122  bone]
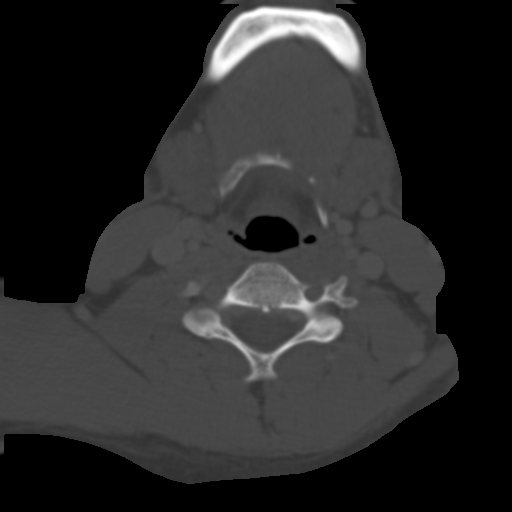

[Series 5: neck 2.0 soft tissue coro · coronal · 0.47mm/px · 1 of 88 slices shown]
[im 44/88  bone]
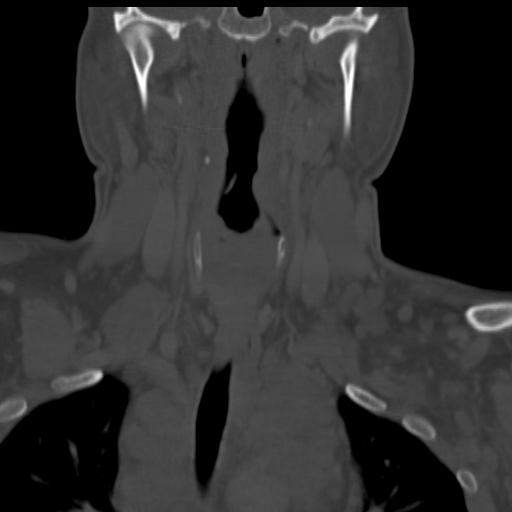

[Series 9: neck 2.0 soft tissue sag · sagittal · 0.11mm/px · 5 of 82 slices shown]
[im 14/82  bone]
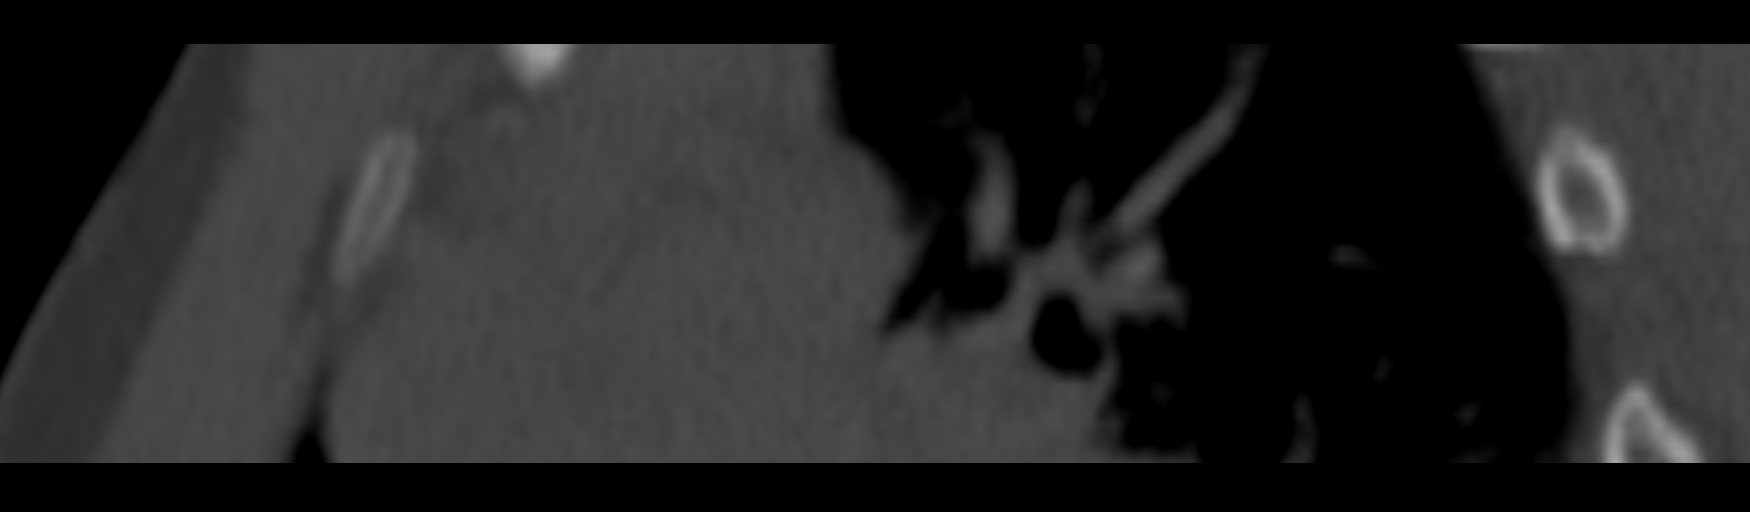
[im 28/82  bone]
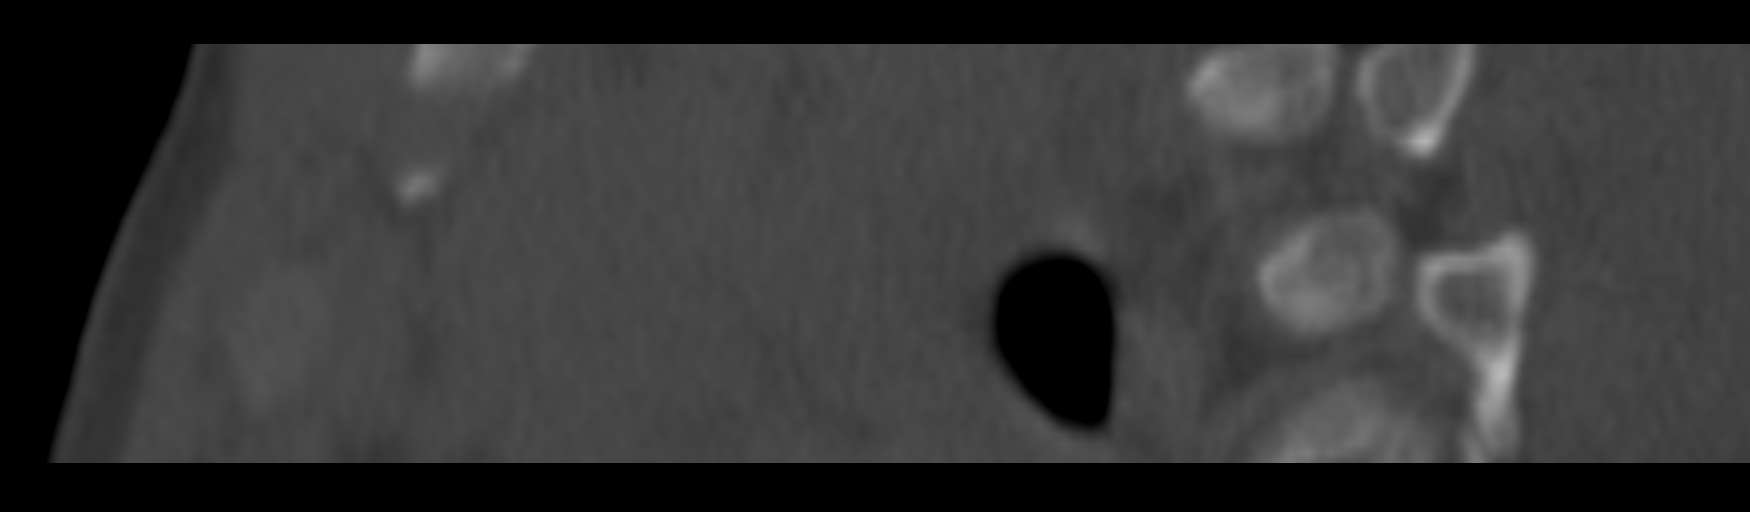
[im 41/82  bone]
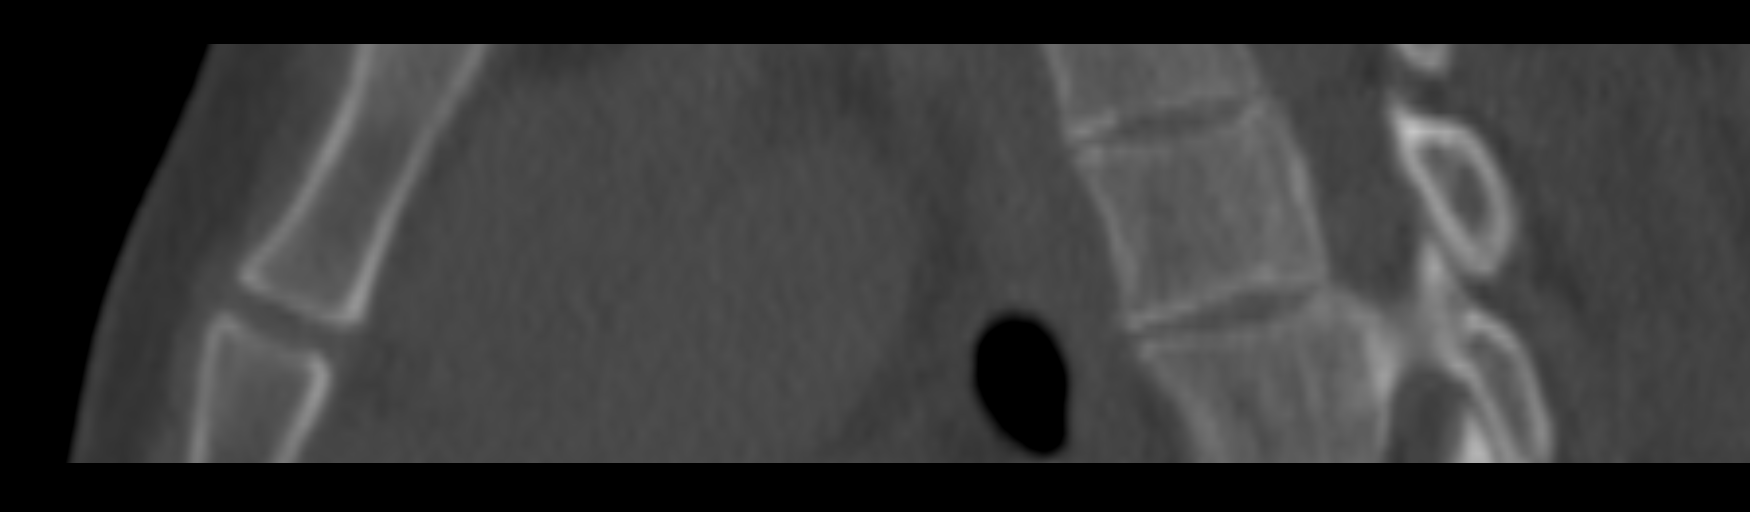
[im 55/82  bone]
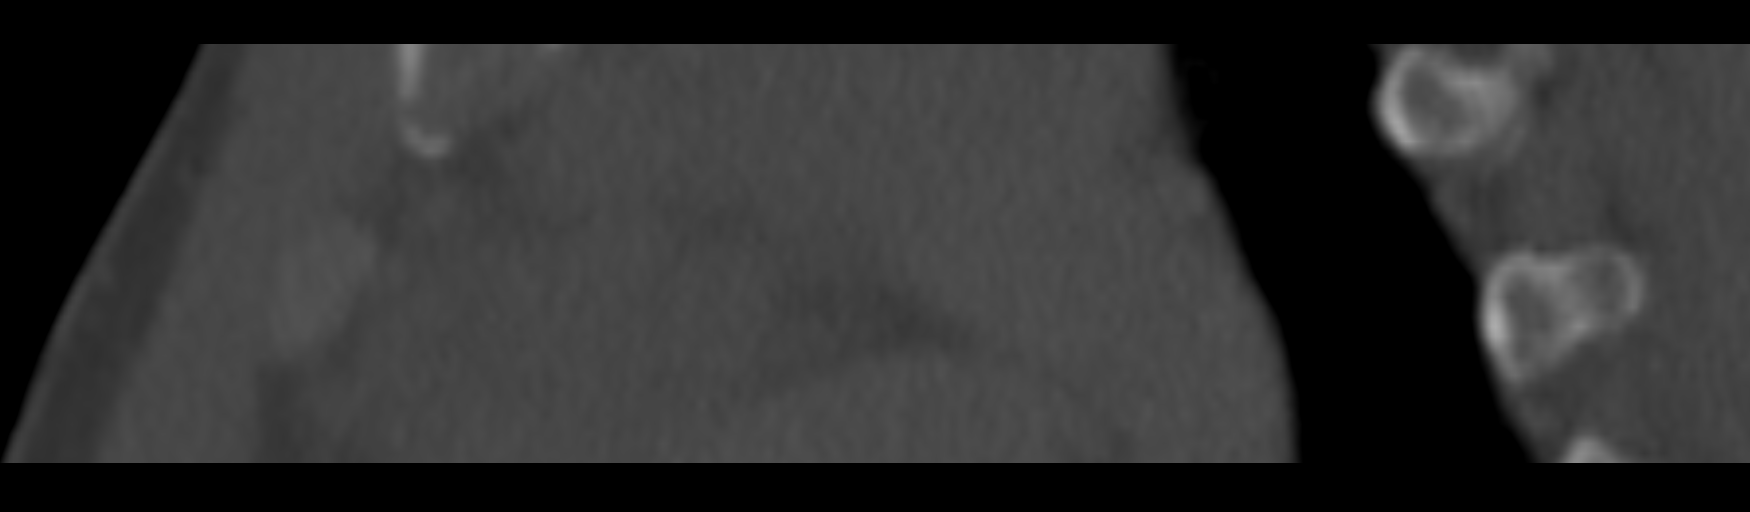
[im 68/82  bone]
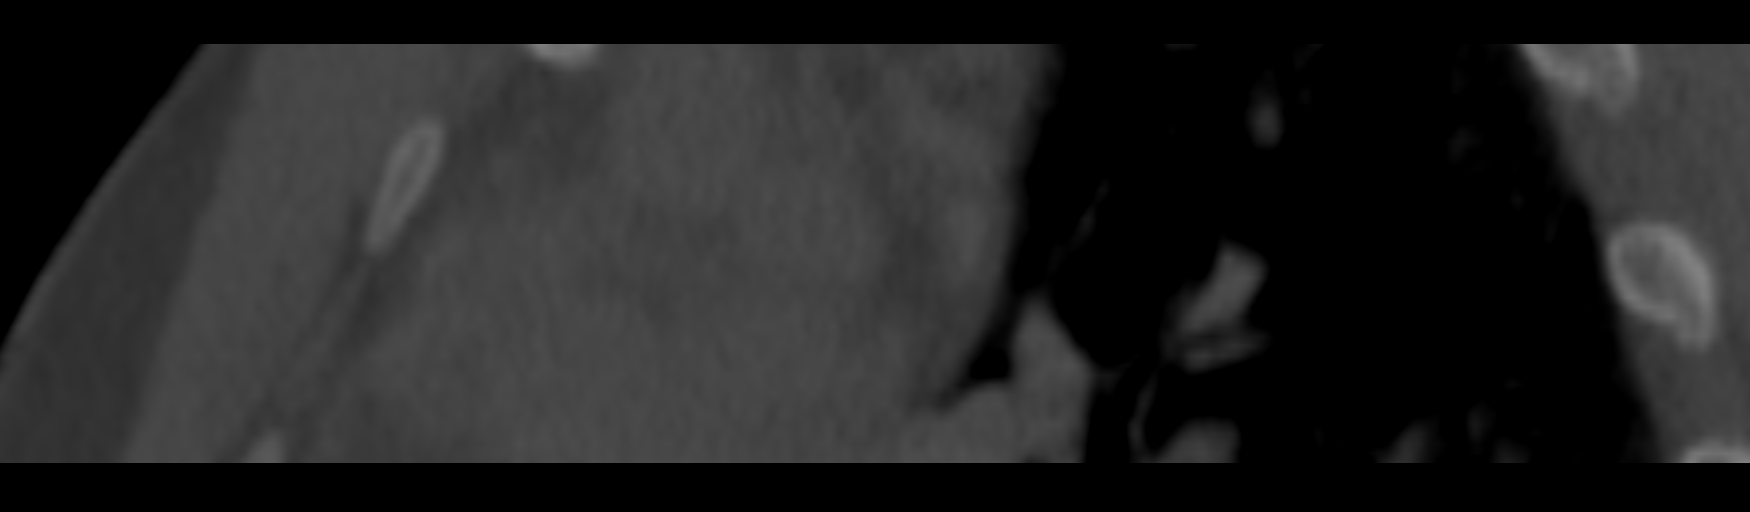

[7 of 33 positions shown; findings below may reference images not displayed]

FINDINGS: Palpable area is located in the left lower neck above the
clavicle.  This corresponds to a 17 mm lymph node.  Multiple
additional supraclavicular nodes are present on the left measuring
approximately 15 mm.  Left axillary adenopathy also present.  Right
supraclavicular node measures 3.4 x 1.6 cm.  No significant
adenopathy in the upper neck.

Extensive mediastinal adenopathy.  Right upper paratracheal node
measures 3.9 x 2.4 cm.  Extensive anterior mediastinal adenopathy
bilaterally.

Submandibular gland and parotid gland are normal bilaterally.  The
thyroid is normal.  Larynx is normal.  No acute bony abnormality.
IMPRESSION: Extensive adenopathy in the superior mediastinum.  Mild adenopathy
in the supra supraclavicular region bilaterally.  Left axillary
adenopathy is incompletely evaluated.

Findings are most suspicious for lymphoma.  Metastatic disease
should also be considered.

I discussed the findings by telephone with Dr. Tveide

## 2014-08-05 ENCOUNTER — Encounter: Payer: Self-pay | Admitting: Family Medicine

## 2014-08-05 ENCOUNTER — Ambulatory Visit (INDEPENDENT_AMBULATORY_CARE_PROVIDER_SITE_OTHER): Payer: PRIVATE HEALTH INSURANCE | Admitting: Family Medicine

## 2014-08-05 VITALS — BP 108/72 | Temp 98.9°F | Ht 69.0 in | Wt 209.2 lb

## 2014-08-05 DIAGNOSIS — H65113 Acute and subacute allergic otitis media (mucoid) (sanguinous) (serous), bilateral: Secondary | ICD-10-CM

## 2014-08-05 DIAGNOSIS — J0100 Acute maxillary sinusitis, unspecified: Secondary | ICD-10-CM

## 2014-08-05 DIAGNOSIS — R509 Fever, unspecified: Secondary | ICD-10-CM

## 2014-08-05 MED ORDER — CEFTRIAXONE SODIUM 1 G IJ SOLR
500.0000 mg | Freq: Once | INTRAMUSCULAR | Status: AC
  Administered 2014-08-05: 500 mg via INTRAMUSCULAR

## 2014-08-05 MED ORDER — LEVOFLOXACIN 500 MG PO TABS: 500.0000 mg | ORAL_TABLET | Freq: Every day | ORAL | Status: DC

## 2014-08-05 NOTE — Progress Notes (Signed)
   Subjective:    Patient ID: Samuel [name], male    DOB: 1975/10/14, 38 y.o.   MRN: [id number]  Sinusitis This is a new problem. The current episode started 1 to 4 weeks ago. The problem is unchanged. Maximum temperature: low grade fever. The pain is moderate. Associated symptoms include congestion, coughing, ear pain, headaches, sinus pressure and a sore throat. (Wheezing) Past treatments include oral decongestants. The treatment provided no relief.  Knee Pain  The incident occurred 3 to 5 days ago. The incident occurred at home. There was no injury mechanism. The pain is present in the left knee and right knee. The quality of the pain is described as aching. The pain is moderate. He reports no foreign bodies present. Nothing aggravates the symptoms. He has tried nothing for the symptoms. The treatment provided no relief.      Review of Systems  Constitutional: Negative for fever and activity change.  HENT: Positive for congestion, ear pain, rhinorrhea, sinus pressure and sore throat.   Eyes: Negative for discharge.  Respiratory: Positive for cough. Negative for wheezing.   Cardiovascular: Negative for chest pain.  Neurological: Positive for headaches.       Objective:   Physical Exam  Constitutional: He appears well-developed.  HENT:  Head: Normocephalic.  Mouth/Throat: Oropharynx is clear and moist. No oropharyngeal exudate.  Bilateral otitis media  Neck: Normal range of motion.  Cardiovascular: Normal rate, regular rhythm and normal heart sounds.   No murmur heard. Pulmonary/Chest: Effort normal and breath sounds normal. He has no wheezes.  Has bronchial cough, no rales  Lymphadenopathy:    He has no cervical adenopathy.  Neurological: He exhibits normal muscle tone.  Skin: Skin is warm and dry.  Nursing note and vitals reviewed.         Assessment & Plan:  Sinusitis secondary to viral syndrome I think that's also causing his knee discomfort he should gradually get  better warning signs discussed follow-up if ongoing troubles.

## 2016-06-21 DIAGNOSIS — Z9484 Stem cells transplant status: Secondary | ICD-10-CM | POA: Diagnosis not present

## 2016-06-21 DIAGNOSIS — M79673 Pain in unspecified foot: Secondary | ICD-10-CM | POA: Diagnosis not present

## 2016-06-21 DIAGNOSIS — C8110 Nodular sclerosis classical Hodgkin lymphoma, unspecified site: Secondary | ICD-10-CM | POA: Diagnosis not present

## 2016-06-21 DIAGNOSIS — C8111 Nodular sclerosis classical Hodgkin lymphoma, lymph nodes of head, face, and neck: Secondary | ICD-10-CM | POA: Diagnosis not present

## 2016-06-21 DIAGNOSIS — M79671 Pain in right foot: Secondary | ICD-10-CM | POA: Diagnosis not present

## 2016-06-21 DIAGNOSIS — M79672 Pain in left foot: Secondary | ICD-10-CM | POA: Diagnosis not present

## 2016-09-02 ENCOUNTER — Other Ambulatory Visit: Payer: Self-pay | Admitting: Nurse Practitioner

## 2018-01-29 ENCOUNTER — Encounter: Payer: Self-pay | Admitting: Family Medicine

## 2018-01-29 ENCOUNTER — Ambulatory Visit: Payer: BLUE CROSS/BLUE SHIELD | Admitting: Family Medicine

## 2018-01-29 VITALS — BP 132/84 | Temp 98.7°F | Ht 69.0 in | Wt 227.0 lb

## 2018-01-29 DIAGNOSIS — R109 Unspecified abdominal pain: Secondary | ICD-10-CM | POA: Diagnosis not present

## 2018-01-29 DIAGNOSIS — N200 Calculus of kidney: Secondary | ICD-10-CM | POA: Diagnosis not present

## 2018-01-29 LAB — POCT URINALYSIS DIPSTICK
SPEC GRAV UA: 1.02 (ref 1.010–1.025)
pH, UA: 6 (ref 5.0–8.0)

## 2018-01-29 MED ORDER — TAMSULOSIN HCL 0.4 MG PO CAPS: 0.4000 mg | ORAL_CAPSULE | Freq: Every day | ORAL | 0 refills | Status: DC

## 2018-01-29 MED ORDER — ONDANSETRON 4 MG PO TBDP: 4.0000 mg | ORAL_TABLET | Freq: Three times a day (TID) | ORAL | 0 refills | Status: DC | PRN

## 2018-01-29 NOTE — Patient Instructions (Signed)

## 2018-01-29 NOTE — Progress Notes (Signed)
   Subjective:    Patient ID: Samuel [name], male    DOB: 08/24/1976, 42 y.o.   MRN: [id number]  Abdominal Pain  This is a new problem. Episode onset: 12 hours ago. Pain location: left side and back. Associated symptoms include vomiting. Treatments tried: gas x and pepto.   Testing patient arrives office for evaluation.  Acute abdominal and flank pain.  12 hours duration.  Occurred suddenly.  Left-sided.  Radiates from left flank all the way down to left lower abdomen and left testicle.  Slight dysuria  No history of kidney stones.  Pain was very severe at times.  Had a difficult time finding a easy position.  Now reports significant improvement.  Also had substantial nausea and vomiting.  Not on any chronic medications or increased risk  Last few hours the pain has come down somewhat   Review of Systems  Gastrointestinal: Positive for abdominal pain and vomiting.       Objective:   Physical Exam  Alert and oriented, vitals reviewed and stable, NAD ENT-TM's and ext canals WNL bilat via otoscopic exam Soft palate, tonsils and post pharynx WNL via oropharyngeal exam Neck-symmetric, no masses; thyroid nonpalpable and nontender Pulmonary-no tachypnea or accessory muscle use; Clear without wheezes via auscultation Card--no abnrml murmurs, rhythm reg and rate WNL Carotid pulses symmetric, without bruits No true CVA tenderness positive left lateral flank tenderness positive left lateral abdominal tenderness.  Bowel sounds good.  Testicular groin exam within normal limits  3-4 red blood cells per high-power field specimen      Assessment & Plan:  Impression high probability of kidney stone.  Rationale discussed.  Hydration encouraged.  Initiate ibuprofen 3 times daily.  Rationale discussed.  Zofran as needed for nausea.  Screen urine.  Mechanism of kidney stone pain discussed.  Hold off on CT at this time.  Warning signs discussed/encouraged patient to get a follow-up urine  specimen when he returns for wellness exam with sommer  Greater than 50% of this 25 minute face to face visit was spent in counseling and discussion and coordination of care regarding the above diagnosis/diagnosies

## 2018-02-18 ENCOUNTER — Encounter: Payer: Self-pay | Admitting: Family Medicine

## 2018-02-18 ENCOUNTER — Ambulatory Visit (INDEPENDENT_AMBULATORY_CARE_PROVIDER_SITE_OTHER): Payer: BLUE CROSS/BLUE SHIELD | Admitting: Family Medicine

## 2018-02-18 VITALS — BP 112/90 | Ht 69.0 in | Wt 226.0 lb

## 2018-02-18 DIAGNOSIS — Z79899 Other long term (current) drug therapy: Secondary | ICD-10-CM

## 2018-02-18 DIAGNOSIS — M25542 Pain in joints of left hand: Secondary | ICD-10-CM | POA: Diagnosis not present

## 2018-02-18 DIAGNOSIS — M545 Low back pain, unspecified: Secondary | ICD-10-CM

## 2018-02-18 DIAGNOSIS — R21 Rash and other nonspecific skin eruption: Secondary | ICD-10-CM | POA: Diagnosis not present

## 2018-02-18 DIAGNOSIS — M25541 Pain in joints of right hand: Secondary | ICD-10-CM

## 2018-02-18 DIAGNOSIS — R739 Hyperglycemia, unspecified: Secondary | ICD-10-CM | POA: Diagnosis not present

## 2018-02-18 DIAGNOSIS — Z0000 Encounter for general adult medical examination without abnormal findings: Secondary | ICD-10-CM | POA: Diagnosis not present

## 2018-02-18 DIAGNOSIS — Z114 Encounter for screening for human immunodeficiency virus [HIV]: Secondary | ICD-10-CM

## 2018-02-18 DIAGNOSIS — Z8572 Personal history of non-Hodgkin lymphomas: Secondary | ICD-10-CM | POA: Diagnosis not present

## 2018-02-18 DIAGNOSIS — R229 Localized swelling, mass and lump, unspecified: Secondary | ICD-10-CM

## 2018-02-18 DIAGNOSIS — Z13220 Encounter for screening for lipoid disorders: Secondary | ICD-10-CM | POA: Diagnosis not present

## 2018-02-18 MED ORDER — TRIAMCINOLONE ACETONIDE 0.1 % EX CREA: 1.0000 "application " | TOPICAL_CREAM | Freq: Two times a day (BID) | CUTANEOUS | 2 refills | Status: AC

## 2018-02-18 NOTE — Progress Notes (Signed)
Subjective:    Patient ID: Samuel [name], male    DOB: 12-25-1975, 42 y.o.   MRN: [id number]  HPI The patient comes in today for a wellness visit.  Patient had lymphoma over 5 years ago has been released by cancer Dr. Does not have night sweats chills fevers States he tries eat healthy stay physically active He does try to practice safety what he does Gets adequate sleep Denies being depressed  A review of their health history was completed.  A review of medications was also completed.  Any needed refills no  Eating habits: Good  Falls/  MVA accidents in past few months: No  Regular exercise: No, some walking  Specialist pt sees on regular basis: No  Preventative health issues were discussed.   Additional concerns: Joint pain--significant issues with joint pains in the hands elbows some degrees the knees and the ankles mainly in the hands last 2 to 3 hours been going on for the past couple months nothing is done has made it worse nothing is made it better he is tried some Tylenol for it he did just describes joint discomfort stiffness and pain does not wake him up at night no numbness or tingling   rash on chest-this is been present for weeks he wears a medical alert bracelet he wonders if this necklace is causing some of the problem if the rash itches intermittently denies any other particular issues   and arm--has a bump on his left arm been present for the past few months when he is out in the sun he can feel some pain and discomfort with it does not bleed does not cross  ,decreased hearing.Did hear test in office.  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  HENT: Negative for congestion and rhinorrhea.   Eyes: Negative for discharge.  Respiratory: Negative for cough and wheezing.     Cardiovascular: Negative for chest pain.  Gastrointestinal: Negative for abdominal pain, blood in stool and vomiting.  Genitourinary: Negative for difficulty urinating and frequency.  Musculoskeletal: Negative for neck pain.  Skin: Negative for rash.  Allergic/Immunologic: Negative for environmental allergies and food allergies.  Neurological: Negative for weakness and headaches.  Psychiatric/Behavioral: Negative for agitation.  Does not radiate down the arms does not do any type of exercises he has had intermittent low back pain that he has had for years     Objective:   Physical Exam  Constitutional: He appears well-developed and well-nourished.  HENT:  Head: Normocephalic and atraumatic.  Right Ear: External ear normal.  Left Ear: External ear normal.  Nose: Nose normal.  Mouth/Throat: Oropharynx is clear and moist.  Eyes: Pupils are equal, round, and reactive to light. EOM are normal.  Neck: Normal range of motion. Neck supple. No thyromegaly present.  Cardiovascular: Normal rate, regular rhythm and normal heart sounds.  No murmur heard. Pulmonary/Chest: Effort normal and breath sounds normal. No respiratory distress. He has no wheezes.  Abdominal: Soft. Bowel sounds are normal. He exhibits no distension and no mass. There is no tenderness.  Genitourinary: Penis normal.  Musculoskeletal: Normal range of motion. He exhibits no edema.  Lymphadenopathy:    He has no cervical adenopathy.  Neurological: He is alert. He exhibits normal muscle tone.  Skin: Skin is warm and  dry. No erythema.  Psychiatric: He has a normal mood and affect. His behavior is normal. Judgment normal.   There is a bump on the left arm he does have a slightly raised appearance along with a pearly edge no crusting no bleeding The joints of the hand did not show any particular swelling no rash The chest does show a rash that looks like a contact dermatitis Patient also complains of intermittent lumbar pain  is had for years Fairly good range of motion     Assessment & Plan:  Adult wellness-complete.wellness physical was conducted today. Importance of diet and exercise were discussed in detail.  In addition to this a discussion regarding safety was also covered. We also reviewed over immunizations and gave recommendations regarding current immunization needed for age.  In addition to this additional areas were also touched on including: Preventative health exams needed:  Colonoscopy not indicated currently  Patient was advised yearly wellness exam  Hand arthralgias lab work ordered await the results I doubt that this is rheumatoid arthritis but we do need to rule this out if it persist may need to see rheumatologist  Rash on chest more than likely contact dermatitis use of steroid cream should gradually help this  Lumbar pain more than likely musculoskeletal stretching exercises recommended does not wake him up at night I do not feel this is a sign of any type of metastatic disease  Screening lab work ordered.  Await the results of this.

## 2018-02-19 ENCOUNTER — Encounter: Payer: Self-pay | Admitting: Family Medicine

## 2018-02-19 LAB — HEPATIC FUNCTION PANEL
ALBUMIN: 4.7 g/dL (ref 3.5–5.5)
ALT: 27 IU/L (ref 0–44)
AST: 20 IU/L (ref 0–40)
Alkaline Phosphatase: 92 IU/L (ref 39–117)
BILIRUBIN TOTAL: 0.9 mg/dL (ref 0.0–1.2)
Bilirubin, Direct: 0.19 mg/dL (ref 0.00–0.40)
TOTAL PROTEIN: 7.5 g/dL (ref 6.0–8.5)

## 2018-02-19 LAB — HIV ANTIBODY (ROUTINE TESTING W REFLEX): HIV Screen 4th Generation wRfx: NONREACTIVE

## 2018-02-19 LAB — CBC WITH DIFFERENTIAL/PLATELET
Basophils Absolute: 0 10*3/uL (ref 0.0–0.2)
Basos: 1 %
EOS (ABSOLUTE): 0.1 10*3/uL (ref 0.0–0.4)
EOS: 1 %
Hematocrit: 44.9 % (ref 37.5–51.0)
Hemoglobin: 15 g/dL (ref 13.0–17.7)
Immature Grans (Abs): 0 10*3/uL (ref 0.0–0.1)
Immature Granulocytes: 0 %
LYMPHS: 43 %
Lymphocytes Absolute: 2.8 10*3/uL (ref 0.7–3.1)
MCH: 29.3 pg (ref 26.6–33.0)
MCHC: 33.4 g/dL (ref 31.5–35.7)
MCV: 88 fL (ref 79–97)
MONOS ABS: 0.7 10*3/uL (ref 0.1–0.9)
Monocytes: 10 %
NEUTROS PCT: 45 %
Neutrophils Absolute: 3 10*3/uL (ref 1.4–7.0)
PLATELETS: 168 10*3/uL (ref 150–450)
RBC: 5.12 x10E6/uL (ref 4.14–5.80)
RDW: 14.1 % (ref 12.3–15.4)
WBC: 6.6 10*3/uL (ref 3.4–10.8)

## 2018-02-19 LAB — BASIC METABOLIC PANEL
BUN/Creatinine Ratio: 16 (ref 9–20)
BUN: 18 mg/dL (ref 6–24)
CALCIUM: 9.6 mg/dL (ref 8.7–10.2)
CHLORIDE: 103 mmol/L (ref 96–106)
CO2: 24 mmol/L (ref 20–29)
Creatinine, Ser: 1.15 mg/dL (ref 0.76–1.27)
GFR calc Af Amer: 90 mL/min/{1.73_m2} (ref >59)
GFR calc non Af Amer: 78 mL/min/{1.73_m2} (ref >59)
Glucose: 108 mg/dL — ABNORMAL HIGH (ref 65–99)
POTASSIUM: 5.1 mmol/L (ref 3.5–5.2)
Sodium: 142 mmol/L (ref 134–144)

## 2018-02-19 LAB — LIPID PANEL
CHOL/HDL RATIO: 5.4 ratio — AB (ref 0.0–5.0)
Cholesterol, Total: 216 mg/dL — ABNORMAL HIGH (ref 100–199)
HDL: 40 mg/dL (ref >39)
LDL Calculated: 146 mg/dL — ABNORMAL HIGH (ref 0–99)
Triglycerides: 150 mg/dL — ABNORMAL HIGH (ref 0–149)
VLDL CHOLESTEROL CAL: 30 mg/dL (ref 5–40)

## 2018-02-19 LAB — C-REACTIVE PROTEIN: CRP: 8 mg/L (ref 0–10)

## 2018-02-19 LAB — RHEUMATOID FACTOR: Rhuematoid fact SerPl-aCnc: <10 IU/mL (ref 0.0–13.9)

## 2018-02-19 LAB — ANA: Anti Nuclear Antibody(ANA): NEGATIVE

## 2018-02-19 LAB — SEDIMENTATION RATE: SED RATE: 6 mm/h (ref 0–15)

## 2018-03-19 DIAGNOSIS — D225 Melanocytic nevi of trunk: Secondary | ICD-10-CM | POA: Diagnosis not present

## 2018-03-19 DIAGNOSIS — D2362 Other benign neoplasm of skin of left upper limb, including shoulder: Secondary | ICD-10-CM | POA: Diagnosis not present

## 2018-05-22 ENCOUNTER — Telehealth: Payer: Self-pay | Admitting: Family Medicine

## 2018-05-22 MED ORDER — SCOPOLAMINE 1 MG/3DAYS TD PT72: 1.0000 | MEDICATED_PATCH | TRANSDERMAL | 0 refills | Status: DC

## 2018-05-22 NOTE — Telephone Encounter (Signed)
Scopolamine patch, one box, 1 refill, apply every 3 days, typically will cause dry mouth, if any severe side effects stop the medicine

## 2018-05-22 NOTE — Telephone Encounter (Signed)
Prescription sent electronically to pharmacy. 

## 2018-05-22 NOTE — Telephone Encounter (Signed)
Please advise 

## 2018-05-22 NOTE — Telephone Encounter (Signed)
Left message to return call 

## 2018-05-22 NOTE — Telephone Encounter (Signed)
Pt is going on a cruise and would like the sea sick patches called in to [id number] - [place], [place]

## 2018-05-22 NOTE — Telephone Encounter (Signed)
Patient returned the call and he said the pharmacy had already called and told him his prescription was ready.  I told him to call back with any problems.

## 2018-07-17 DIAGNOSIS — Z029 Encounter for administrative examinations, unspecified: Secondary | ICD-10-CM

## 2020-04-14 ENCOUNTER — Other Ambulatory Visit: Payer: Self-pay

## 2020-04-14 ENCOUNTER — Encounter: Payer: Self-pay | Admitting: Family Medicine

## 2020-04-14 ENCOUNTER — Ambulatory Visit (INDEPENDENT_AMBULATORY_CARE_PROVIDER_SITE_OTHER): Payer: BC Managed Care – PPO | Admitting: Family Medicine

## 2020-04-14 VITALS — HR 110 | Temp 99.1°F | Resp 16 | Ht 69.0 in | Wt 235.0 lb

## 2020-04-14 DIAGNOSIS — J0100 Acute maxillary sinusitis, unspecified: Secondary | ICD-10-CM | POA: Diagnosis not present

## 2020-04-14 DIAGNOSIS — H66002 Acute suppurative otitis media without spontaneous rupture of ear drum, left ear: Secondary | ICD-10-CM | POA: Diagnosis not present

## 2020-04-14 MED ORDER — AZITHROMYCIN 250 MG PO TABS: ORAL_TABLET | ORAL | 0 refills | Status: AC

## 2020-04-14 NOTE — Progress Notes (Signed)
Patient ID: Samuel [name], male    DOB: 03-Dec-1975, 44 y.o.   MRN: [id number]   Chief Complaint  Patient presents with  . Sinusitis   Subjective:    Sinusitis This is a new problem. Episode onset: 6 days  There has been no fever. Associated symptoms include congestion, coughing, ear pain and sinus pressure. Past treatments include nothing.     Medical History Samuel [name] has a past medical history of Acute respiratory failure with hypoxia ([place]) (09/29/2012), Bilateral hearing loss, Diastolic dysfunction, GERD (gastroesophageal reflux disease), Hodgkin lymphoma ([place]), Hodgkin's disease ([place]), Mass of left side of neck (04/2012), Pulmonary emboli ([place]) (09/29/2012), and Shortness of breath.   Outpatient Encounter Medications as of 04/14/2020  Medication Sig  . azithromycin (ZITHROMAX) 250 MG tablet Take 2 tablets day one, then one tablet for 4 days  . triamcinolone cream (KENALOG) 0.1 % Apply 1 application topically 2 (two) times daily. Apply Bid prn  . [DISCONTINUED] ondansetron (ZOFRAN ODT) 4 MG disintegrating tablet Take 1 tablet (4 mg total) by mouth every 8 (eight) hours as needed for nausea or vomiting. (Patient not taking: Reported on 02/18/2018)  . [DISCONTINUED] ranitidine (ZANTAC) 150 MG tablet Take 150 mg by mouth daily as needed for heartburn.  . [DISCONTINUED] scopolamine (TRANSDERM-SCOP, 1.5 MG,) 1 MG/3DAYS Place 1 patch (1.5 mg total) onto the skin every 3 (three) days. (Patient not taking: Reported on 04/14/2020)  . [DISCONTINUED] tamsulosin (FLOMAX) 0.4 MG CAPS capsule Take 1 capsule (0.4 mg total) by mouth daily. (Patient not taking: Reported on 02/18/2018)   No facility-administered encounter medications on file as of 04/14/2020.     Review of Systems  HENT: Positive for congestion, ear pain and sinus pressure.   Respiratory: Positive for cough.      Vitals Pulse (!) 110   Temp 99.1 F (37.3 C)   Resp 16   Ht 5\' 9"  (1.753 m)   Wt 235 lb (106.6 kg)   SpO2 94%   BMI  34.70 kg/m   Objective:   Physical Exam Vitals reviewed.  Constitutional:      General: He is not in acute distress.    Appearance: Normal appearance. He is not ill-appearing, toxic-appearing or diaphoretic.  HENT:     Right Ear: Tympanic membrane normal.     Left Ear: Tympanic membrane is erythematous and bulging.     Mouth/Throat:     Comments: Slight erythema, no exudate. Cardiovascular:     Rate and Rhythm: Regular rhythm. Tachycardia present.     Heart sounds: Normal heart sounds.  Pulmonary:     Effort: Pulmonary effort is normal. No respiratory distress.     Breath sounds: Normal breath sounds. No wheezing, rhonchi or rales.  Skin:    General: Skin is warm and dry.  Neurological:     Mental Status: He is alert.  Psychiatric:        Mood and Affect: Mood normal.        Behavior: Behavior normal.      Assessment and Plan   1. Acute suppurative otitis media of left ear without spontaneous rupture of tympanic membrane, recurrence not specified - azithromycin (ZITHROMAX) 250 MG tablet; Take 2 tablets day one, then one tablet for 4 days  Dispense: 6 each; Refill: 0  2. Acute non-recurrent maxillary sinusitis - azithromycin (ZITHROMAX) 250 MG tablet; Take 2 tablets day one, then one tablet for 4 days  Dispense: 6 each; Refill: 0   [name] presents today as a car  visit.  He reports left ear pain and pressure over the maxillary sinus on the left.  This has been going on for 6 days now.  No associated fever.  This feels like an ear infection that he has had in the past.  [name]'s ear looked purulent with a  bulging TM.  Agrees with plan of care discussed today. Understands warning signs to seek further care: fever, increased pain. Understands to follow-up on Monday if symptoms do not improve or if anything changes. Work note given.  In addition to the antibiotic I prescribed, I suggest OTC medications for  symptom relief and the following:   1) Get lots of  rest.  2) Take over the counter pain medication if needed, such as acetaminophen or ibuprofen. Read and follow instructions on the label and make sure not to combine other medications that may have same ingredients in it. It is important to not take too much of these ingredients.  3) Drink plenty of caffeine-free fluids. (If you have heart or kidney problems, follow the instructions of your specialist regarding amounts).  4) If you are hungry, eat a bland diet, such as the BRAT diet (bananas, rice, applesauce, toast).  5) Let us know if you are not feeling better by Monday. I do not have strong suspicion of Covid-19 today, but that is always a possibility. Will test Monday if this treatment is not helping. Instructed to self-isolate to protect family and community.   Pecolia Ades, NP

## 2022-03-14 DIAGNOSIS — L905 Scar conditions and fibrosis of skin: Secondary | ICD-10-CM | POA: Diagnosis not present

## 2022-03-14 DIAGNOSIS — L820 Inflamed seborrheic keratosis: Secondary | ICD-10-CM | POA: Diagnosis not present

## 2022-03-14 DIAGNOSIS — L918 Other hypertrophic disorders of the skin: Secondary | ICD-10-CM | POA: Diagnosis not present

## 2022-03-14 DIAGNOSIS — D224 Melanocytic nevi of scalp and neck: Secondary | ICD-10-CM | POA: Diagnosis not present

## 2022-03-14 DIAGNOSIS — L255 Unspecified contact dermatitis due to plants, except food: Secondary | ICD-10-CM | POA: Diagnosis not present
